# Patient Record
Sex: Female | Born: 1935
Health system: Southern US, Community
[De-identification: ages and names within clinical notes are randomized; demographics above are authoritative.]

## PROBLEM LIST (undated history)

## (undated) DIAGNOSIS — I341 Nonrheumatic mitral (valve) prolapse: Secondary | ICD-10-CM

## (undated) DIAGNOSIS — E039 Hypothyroidism, unspecified: Secondary | ICD-10-CM

## (undated) DIAGNOSIS — K529 Noninfective gastroenteritis and colitis, unspecified: Secondary | ICD-10-CM

## (undated) DIAGNOSIS — K219 Gastro-esophageal reflux disease without esophagitis: Secondary | ICD-10-CM

## (undated) DIAGNOSIS — K648 Other hemorrhoids: Secondary | ICD-10-CM

## (undated) DIAGNOSIS — D869 Sarcoidosis, unspecified: Secondary | ICD-10-CM

## (undated) DIAGNOSIS — Z4802 Encounter for removal of sutures: Secondary | ICD-10-CM

## (undated) DIAGNOSIS — W07XXXA Fall from chair, initial encounter: Secondary | ICD-10-CM

## (undated) DIAGNOSIS — R011 Cardiac murmur, unspecified: Secondary | ICD-10-CM

## (undated) DIAGNOSIS — A048 Other specified bacterial intestinal infections: Secondary | ICD-10-CM

## (undated) DIAGNOSIS — T7840XA Allergy, unspecified, initial encounter: Secondary | ICD-10-CM

## (undated) DIAGNOSIS — E785 Hyperlipidemia, unspecified: Secondary | ICD-10-CM

## (undated) DIAGNOSIS — G459 Transient cerebral ischemic attack, unspecified: Secondary | ICD-10-CM

## (undated) DIAGNOSIS — G4700 Insomnia, unspecified: Secondary | ICD-10-CM

## (undated) HISTORY — DX: Encounter for removal of sutures: Z48.02

## (undated) HISTORY — DX: Hypothyroidism, unspecified: E03.9

## (undated) HISTORY — DX: Allergy, unspecified, initial encounter: T78.40XA

## (undated) HISTORY — DX: Nonrheumatic mitral (valve) prolapse: I34.1

## (undated) HISTORY — DX: Insomnia, unspecified: G47.00

## (undated) HISTORY — DX: Gastro-esophageal reflux disease without esophagitis: K21.9

## (undated) HISTORY — DX: Hyperlipidemia, unspecified: E78.5

## (undated) HISTORY — DX: Other specified bacterial intestinal infections: A04.8

## (undated) HISTORY — DX: Other hemorrhoids: K64.8

## (undated) HISTORY — DX: Transient cerebral ischemic attack, unspecified: G45.9

## (undated) HISTORY — DX: Cardiac murmur, unspecified: R01.1

## (undated) HISTORY — DX: Noninfective gastroenteritis and colitis, unspecified: K52.9

## (undated) HISTORY — DX: Fall from chair, initial encounter: W07.XXXA

## (undated) HISTORY — DX: Sarcoidosis, unspecified: D86.9

## (undated) HISTORY — PX: TONSILLECTOMY: SUR1361

---

## 1964-09-08 DIAGNOSIS — D869 Sarcoidosis, unspecified: Secondary | ICD-10-CM

## 1964-09-08 HISTORY — PX: LYMPH NODE BIOPSY: SHX201

## 1964-09-08 HISTORY — DX: Sarcoidosis, unspecified: D86.9

## 1970-09-08 HISTORY — PX: VAGINAL HYSTERECTOMY: SUR661

## 1970-09-08 HISTORY — PX: ABDOMINAL HYSTERECTOMY: SHX81

## 1998-04-16 ENCOUNTER — Ambulatory Visit (HOSPITAL_COMMUNITY): Admission: RE | Admit: 1998-04-16 | Discharge: 1998-04-16 | Payer: Self-pay | Admitting: Gastroenterology

## 1998-10-08 ENCOUNTER — Encounter: Admission: RE | Admit: 1998-10-08 | Discharge: 1998-10-08 | Payer: Self-pay | Admitting: Internal Medicine

## 1999-01-21 ENCOUNTER — Encounter: Payer: Self-pay | Admitting: Emergency Medicine

## 1999-01-21 ENCOUNTER — Emergency Department (HOSPITAL_COMMUNITY): Admission: EM | Admit: 1999-01-21 | Discharge: 1999-01-21 | Payer: Self-pay | Admitting: Emergency Medicine

## 1999-09-17 ENCOUNTER — Encounter: Admission: RE | Admit: 1999-09-17 | Discharge: 1999-09-17 | Payer: Self-pay | Admitting: Internal Medicine

## 1999-10-10 ENCOUNTER — Ambulatory Visit (HOSPITAL_COMMUNITY): Admission: RE | Admit: 1999-10-10 | Discharge: 1999-10-10 | Payer: Self-pay | Admitting: Internal Medicine

## 1999-10-10 ENCOUNTER — Encounter: Payer: Self-pay | Admitting: Internal Medicine

## 1999-10-24 ENCOUNTER — Ambulatory Visit (HOSPITAL_COMMUNITY): Admission: RE | Admit: 1999-10-24 | Discharge: 1999-10-24 | Payer: Self-pay | Admitting: Internal Medicine

## 1999-10-24 ENCOUNTER — Encounter: Payer: Self-pay | Admitting: Internal Medicine

## 2000-03-30 ENCOUNTER — Encounter: Admission: RE | Admit: 2000-03-30 | Discharge: 2000-03-30 | Payer: Self-pay | Admitting: Internal Medicine

## 2001-05-27 ENCOUNTER — Encounter: Admission: RE | Admit: 2001-05-27 | Discharge: 2001-05-27 | Payer: Self-pay | Admitting: Family Medicine

## 2001-05-27 ENCOUNTER — Encounter: Payer: Self-pay | Admitting: Family Medicine

## 2001-09-01 ENCOUNTER — Encounter: Payer: Self-pay | Admitting: Emergency Medicine

## 2001-09-01 ENCOUNTER — Emergency Department (HOSPITAL_COMMUNITY): Admission: EM | Admit: 2001-09-01 | Discharge: 2001-09-01 | Payer: Self-pay | Admitting: Emergency Medicine

## 2001-09-20 ENCOUNTER — Encounter: Payer: Self-pay | Admitting: Family Medicine

## 2001-09-20 ENCOUNTER — Encounter: Admission: RE | Admit: 2001-09-20 | Discharge: 2001-09-20 | Payer: Self-pay | Admitting: Family Medicine

## 2001-11-17 ENCOUNTER — Encounter: Admission: RE | Admit: 2001-11-17 | Discharge: 2001-11-17 | Payer: Self-pay | Admitting: Orthopedic Surgery

## 2001-11-17 ENCOUNTER — Encounter: Payer: Self-pay | Admitting: Orthopedic Surgery

## 2003-01-19 ENCOUNTER — Encounter: Payer: Self-pay | Admitting: Family Medicine

## 2004-08-15 ENCOUNTER — Ambulatory Visit: Payer: Self-pay | Admitting: Internal Medicine

## 2004-08-20 ENCOUNTER — Ambulatory Visit: Payer: Self-pay | Admitting: Internal Medicine

## 2004-08-30 ENCOUNTER — Ambulatory Visit: Payer: Self-pay | Admitting: Internal Medicine

## 2005-04-04 ENCOUNTER — Ambulatory Visit: Payer: Self-pay | Admitting: Ophthalmology

## 2005-04-08 ENCOUNTER — Ambulatory Visit: Payer: Self-pay | Admitting: Ophthalmology

## 2005-04-08 DIAGNOSIS — G459 Transient cerebral ischemic attack, unspecified: Secondary | ICD-10-CM

## 2005-04-08 HISTORY — DX: Transient cerebral ischemic attack, unspecified: G45.9

## 2005-04-10 ENCOUNTER — Ambulatory Visit: Payer: Self-pay

## 2005-04-11 ENCOUNTER — Ambulatory Visit: Payer: Self-pay | Admitting: Cardiology

## 2005-04-24 ENCOUNTER — Encounter: Admission: RE | Admit: 2005-04-24 | Discharge: 2005-04-24 | Payer: Self-pay | Admitting: Cardiology

## 2005-05-07 ENCOUNTER — Ambulatory Visit: Payer: Self-pay | Admitting: Cardiology

## 2005-06-16 ENCOUNTER — Ambulatory Visit: Payer: Self-pay | Admitting: Cardiology

## 2005-07-15 ENCOUNTER — Ambulatory Visit: Payer: Self-pay | Admitting: Cardiology

## 2006-06-30 ENCOUNTER — Encounter: Admission: RE | Admit: 2006-06-30 | Discharge: 2006-06-30 | Payer: Self-pay | Admitting: Internal Medicine

## 2007-01-12 ENCOUNTER — Ambulatory Visit: Payer: Self-pay | Admitting: Cardiology

## 2007-07-08 ENCOUNTER — Encounter: Admission: RE | Admit: 2007-07-08 | Discharge: 2007-07-08 | Payer: Self-pay | Admitting: Internal Medicine

## 2008-07-10 ENCOUNTER — Encounter: Admission: RE | Admit: 2008-07-10 | Discharge: 2008-07-10 | Payer: Self-pay | Admitting: Internal Medicine

## 2008-07-13 ENCOUNTER — Encounter: Payer: Self-pay | Admitting: Family Medicine

## 2008-08-11 ENCOUNTER — Encounter: Payer: Self-pay | Admitting: Family Medicine

## 2008-12-18 ENCOUNTER — Encounter: Payer: Self-pay | Admitting: Family Medicine

## 2009-02-02 DIAGNOSIS — I2510 Atherosclerotic heart disease of native coronary artery without angina pectoris: Secondary | ICD-10-CM

## 2009-02-02 DIAGNOSIS — I2583 Coronary atherosclerosis due to lipid rich plaque: Secondary | ICD-10-CM

## 2009-02-02 DIAGNOSIS — I341 Nonrheumatic mitral (valve) prolapse: Secondary | ICD-10-CM | POA: Insufficient documentation

## 2009-02-13 ENCOUNTER — Ambulatory Visit: Payer: Self-pay | Admitting: Cardiology

## 2009-02-13 DIAGNOSIS — E782 Mixed hyperlipidemia: Secondary | ICD-10-CM

## 2009-07-15 ENCOUNTER — Emergency Department: Payer: Self-pay | Admitting: Emergency Medicine

## 2009-09-26 ENCOUNTER — Encounter: Payer: Self-pay | Admitting: Family Medicine

## 2009-09-26 ENCOUNTER — Encounter: Admission: RE | Admit: 2009-09-26 | Discharge: 2009-09-26 | Payer: Self-pay | Admitting: Internal Medicine

## 2009-11-16 ENCOUNTER — Encounter: Payer: Self-pay | Admitting: Family Medicine

## 2010-02-14 ENCOUNTER — Encounter: Payer: Self-pay | Admitting: Family Medicine

## 2010-03-18 ENCOUNTER — Ambulatory Visit: Payer: Self-pay | Admitting: Family Medicine

## 2010-03-18 DIAGNOSIS — E039 Hypothyroidism, unspecified: Secondary | ICD-10-CM

## 2010-03-18 DIAGNOSIS — K900 Celiac disease: Secondary | ICD-10-CM | POA: Insufficient documentation

## 2010-03-21 ENCOUNTER — Encounter: Payer: Self-pay | Admitting: Family Medicine

## 2010-03-29 ENCOUNTER — Encounter: Payer: Self-pay | Admitting: Family Medicine

## 2010-05-06 ENCOUNTER — Telehealth: Payer: Self-pay | Admitting: Family Medicine

## 2010-07-09 ENCOUNTER — Encounter: Payer: Self-pay | Admitting: Family Medicine

## 2010-09-08 DIAGNOSIS — A048 Other specified bacterial intestinal infections: Secondary | ICD-10-CM

## 2010-09-08 DIAGNOSIS — K529 Noninfective gastroenteritis and colitis, unspecified: Secondary | ICD-10-CM

## 2010-09-08 HISTORY — DX: Noninfective gastroenteritis and colitis, unspecified: K52.9

## 2010-09-08 HISTORY — DX: Other specified bacterial intestinal infections: A04.8

## 2010-10-08 NOTE — Miscellaneous (Signed)
Summary: prevention update  Clinical Lists Changes  Allergies: Added new allergy or adverse reaction of * ACTONEL AND FOSAMAX Observations: Added new observation of DEXANXTDUE: None (03/29/2010 10:52) Added new observation of PAST MED HX: MITRAL VALVE PROLAPSE (ICD-424.0) CORONARY ATHEROSCLEROSIS D/T LIPID RICH PLAQUE (ICD-414.3) TIA in 04/2005   Hypothyroidism  (03/29/2010 10:52) Added new observation of LASTBONESCAN: 07/13/2008 (07/13/2008 10:54) Added new observation of BONE DENSITY: abnormal (07/13/2008 10:54)      Past Medical History:    MITRAL VALVE PROLAPSE (ICD-424.0)    CORONARY ATHEROSCLEROSIS D/T LIPID RICH PLAQUE (ICD-414.3)    TIA in 04/2005         Hypothyroidism   Bone Density Result Date:  07/13/2008 Bone Density Result:  abnormal

## 2010-10-08 NOTE — Assessment & Plan Note (Signed)
Summary: NEW PT TO EST/CLE   Vital Signs:  Patient profile:   76 year old female Height:      63 inches Weight:      98 pounds BMI:     17.42 Temp:     98.5 degrees F oral Pulse rate:   68 / minute Pulse rhythm:   regular BP sitting:   120 / 70  (left arm) Cuff size:   regular  Vitals Entered By: Linde Gillis CMA Duncan Dull) (March 18, 2010 1:53 PM) CC: new patient, establish care  Vision Screening:Left eye with correction: 20 / 40 Right eye with correction: 20 / 25 Both eyes with correction: 20 / 30        Vision Entered By: Linde Gillis CMA Duncan Dull) (March 18, 2010 2:33 PM)  Hearing Screen  20db HL: Left  500 hz: 40db 1000 hz: 40db 2000 hz: 40db 4000 hz: 40db Right  500 hz: 25db 1000 hz: 25db 2000 hz: 25db 4000 hz: 25db   Hearing Testing Entered By: Linde Gillis CMA Duncan Dull) (March 18, 2010 2:33 PM)   History of Present Illness: New patient here for Medicare AWV:  1.   Risk factors based on Past M, S, F history: Hyperlipidemia- well controlled on Crestor 5 mg daily. Gluten intolerance- recently diagnosed with gluten intolerance at the [facility] (review scanned labs).  Adjusting her diet. Hypothyroidism- started on Armour 60 mg daily by [facility] last month.  Denies any signs or symptoms of hypo or hyperthyroidism. 2.   Physical Activities: very physically active, plays golf several times per week. 3.   Depression/mood: very happy, denies any symptoms of depression. 4.   Hearing: see nursing assessment- she is aware of some mild deficit. 5.   ADL's: see Geriatrics Assessment. 6.   Fall Risk: not a fall risk. 7.   Home Safety: very mobile, lives with husband.  home is safe. 8.   Height, weight, &visual acuity: see nursing assessment. 9.   Counseling:  Discussed gluten free diet, she will be seeing nutritionist Cheryll Dessert.  10.   Labs ordered based on risk factors: none, UTD on all labs.  Scanned into EMR.  11.           Referral Coordination UTD on all  prevention, does not need referral at this time. 12.           Care Plan- doing well.  Very active and healthy. Will plan to recheck lipids in one year. 13.            Cognitive Assessment- see Geriatrics assessment.   Current Medications (verified): 1)  Crestor 5 Mg Tabs (Rosuvastatin Calcium) .... Take 1 Tablet By Mouth At Bedtime 2)  Multivitamins   Tabs (Multiple Vitamin) .Marland Kitchen.. 1 Tab Once Daily 3)  Vitamin D 1000 Unit Caps (Cholecalciferol) .Marland Kitchen.. 1 Cap Once Daily 4)  Aspirin 81 Mg Tbec (Aspirin) .... Take One Tablet By Mouth Daily 5)  Calcium 500 Mg Tabs (Calcium Carbonate) .... 2 Tabs Two Times A Day 6)  Armour Thyroid 30 Mg Tabs (Thyroid) .... Take One Tablet By Mouth Two Times A Day 7)  Intrinsi B12-Folate 800-500-20 Mcg-Mcg-Mg Tabs (Folate-B12-Intrinsic Factor) .... Take One Tablet By Mouth Once Weekly  Allergies (verified): 1)  ! Pcn  Past History:  Past Surgical History: Last updated: 02/02/2009 Hysterectomy..1972  Family History: Last updated: 02/02/2009 Family History of Cancer: Mother deceased at 40 Family History of Alcoholism: 1 Brother deceased at 65, other brother still alive  Social History: Last updated: 03/18/2010 Retired .Marland Kitchen42 Married  Tobacco Use - No.  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 Eyes:  Denies blurring. ENT:  Denies difficulty swallowing. CV:  Denies chest pain or discomfort. Resp:  Denies shortness of breath. GI:  Denies abdominal pain, bloody stools, and change in bowel  habits. GU:  Denies discharge and dysuria. MS:  Denies joint pain, joint redness, and joint swelling. Derm:  Denies rash. Neuro:  Denies headaches. Psych:  Denies anxiety and depression. Endo:  Denies cold intolerance and heat intolerance. Heme:  Denies abnormal bruising and bleeding.  Physical Exam  General:  Well developed, well nourished, in no acute distress. Head:  normocephalic and atraumatic Eyes:  PERRLA/EOM intact; conjunctiva and lids normal. Ears:  R ear normal and L ear normal.   Nose:  no external deformity.   Mouth:  good dentition.   Neck:  Neck supple, no JVD. No masses, thyromegaly or abnormal cervical nodes. Chest Wall:  no deformities or breast masses noted Lungs:  Clear bilaterally to auscultation and percussion. Heart:  Normal rate and regular rhythm. S1 and S2 normal without gallop, murmur, click, rub or other extra sounds. Abdomen:  Bowel sounds positive; abdomen soft and non-tender without masses, organomegaly, or hernias noted. No hepatosplenomegaly. Extremities:  no edema Neurologic:  alert & oriented X3 and cranial nerves II-XII intact.   Skin:  Intact without lesions or rashes. Psych:  Cognition and judgment appear intact. Alert and cooperative with normal attention span and concentration. No apparent delusions, illusions, hallucinations   Impression & Recommendations:  Problem # 1:  Preventive Health Care (ICD-V70.0)  Reviewed preventive care protocols, scheduled due services, and updated immunizations Discussed nutrition, exercise, diet, and healthy lifestyle.  UTD on all prevention measures.  Orders: Subsequent annual wellness visit with prevention plan ([id number])  Complete Medication List: 1)  Crestor 5 Mg Tabs (Rosuvastatin calcium) .... Take 1 tablet by mouth at bedtime 2)  Multivitamins Tabs (Multiple vitamin) .Marland Kitchen.. 1 tab once daily 3)  Vitamin D 1000 Unit Caps (Cholecalciferol) .Marland Kitchen.. 1 cap once daily 4)  Aspirin 81 Mg Tbec (Aspirin) .... Take  one tablet by mouth daily 5)  Calcium 500 Mg Tabs (Calcium carbonate) .... 2 tabs two times a day 6)  Armour Thyroid 30 Mg Tabs (Thyroid) .... Take one tablet by mouth two times a day 7)  Intrinsi B12-folate 800-500-20 Mcg-mcg-mg Tabs (Folate-b12-intrinsic factor) .... Take one tablet by mouth once weekly  Current Allergies (reviewed today): ! PCN  TD Result Date:  03/29/2009 TD Result:  historical Pneumovax Result Date:  03/21/2009 Pneumovax Result:  historical Herpes Zoster Result Date:  03/16/2008 Herpes Zoster Result:  historical HDL Result Date:  02/14/2010 HDL Result:  93 HDL Next Due:  1 yr LDL Result Date:  02/14/2010 LDL Result:  57 LDL Next Due:  1 yr Flex Sig Next Due:  Not Indicated Colonoscopy Result Date:  09/20/2009 Colonoscopy Result:  normal Colonoscopy Next Due:  10 yr Hemoccult Next Due:  Not Indicated PAP Next Due:  Not  Indicated Mammogram Result Date:  09/11/2009 Mammogram Result:  normal Mammogram Next Due:  1 yr  Prevention & Chronic Care Immunizations   Influenza vaccine: Not documented    Tetanus booster: 03/29/2009: historical   Tetanus booster due: 03/30/2019    Pneumococcal vaccine: historical  (03/21/2009)   Pneumococcal vaccine due: None    H. zoster vaccine: 03/16/2008: historical  Colorectal Screening   Hemoccult: Not documented   Hemoccult due: Not Indicated    Colonoscopy: normal  (09/20/2009)   Colonoscopy due: 09/21/2019  Other Screening   Pap smear: Not documented   Pap smear due: Not Indicated    Mammogram: normal  (09/11/2009)   Mammogram due: 09/11/2010    DXA bone density scan: Not documented   Smoking status: never  (02/02/2009)  Lipids   Total Cholesterol: Not documented   Lipid panel action/deferral: Not indicated   LDL: 57  (02/14/2010)   LDL Direct: Not documented   HDL: 93  (02/14/2010)   Triglycerides: Not documented    SGOT (AST): Not documented   BMP action: Not indicated   SGPT (ALT): Not  documented   Alkaline phosphatase: Not documented   Total bilirubin: Not documented    Lipid flowsheet reviewed?: Yes   Progress toward LDL goal: At goal  Self-Management Support :    Lipid self-management support: Not documented      Geriatric Assessment:  Activities of Daily Living:    Bathing-independent    Dressing-independent    Eating-independent    Toileting-independent    Transferring-independent    Continence-independent  Instrumental Activities of Daily Living:    Transportation-independent    Meal/Food Preparation-independent    Shopping Errands-independent    Housekeeping/Chores-independent    Money Management/Finances-independent    Medication Management-independent    Ability to Use Telephone-independent    Laundry-independent  Mental Status Exam: (value/max value)    Orientation to Time: 5/5    Orientation to Place: 5/5    Registration: 3/3    Attention/Calculation: 5/5    Recall: 3/3    Language-name 2 objects: 2/2    Language-repeat: 1/1    Language-follow 3-step command: 3/3    Language-read and follow direction: 1/1    Write a sentence: 1/1    Copy design: 1/1 MSE Total score: 30/30

## 2010-10-08 NOTE — Letter (Signed)
Summary: [facility]  [facility]   Imported By: Lanelle Bal 03/27/2010 13:35:00  _____________________________________________________________________  External Attachment:    Type:   Image     Comment:   External Document

## 2010-10-08 NOTE — Miscellaneous (Signed)
Summary: Declaration Of A Desire For A Natural Death  Declaration Of A Desire For A Natural Death   Imported By: Beau Fanny 03/29/2010 14:45:56  _____________________________________________________________________  External Attachment:    Type:   Image     Comment:   External Document

## 2010-10-08 NOTE — Progress Notes (Signed)
Summary: wants phone call  Phone Note Call from Patient Call back at Home Phone [phone]   Caller: Patient Call For: Dr. Dayton Martes  Summary of Call: Patient saw her dermatologist on saturday, and found out that she has shingles. She is leaving tomorrow morning to go to [place]. She is asking if you could give her a call. She would like to ask you a couple of questions about the shingles before leaving.  Initial call taken by: Melody Comas,  May 06, 2010 9:05 AM  Follow-up for Phone Call        Returned pt's phone call.  Has shingles around left arm pit and on back.  On Valtrex and Alleve.  Wants to know what to expect. Discussed course of Shingles.  Pt happy with explanation. Ruthe Mannan MD  May 06, 2010 9:54 AM

## 2010-10-08 NOTE — Letter (Signed)
Summary: [facility]  [facility]   Imported By: Lennie Odor 04/02/2010 11:13:52  _____________________________________________________________________  External Attachment:    Type:   Image     Comment:   External Document

## 2010-10-08 NOTE — Procedures (Signed)
Summary: Colonoscopy / [facility]  Colonoscopy / [facility]   Imported By: Lennie Odor 04/02/2010 10:58:20  _____________________________________________________________________  External Attachment:    Type:   Image     Comment:   External Document

## 2010-10-08 NOTE — Miscellaneous (Signed)
Summary: Allergy Testing/[facility]  Allergy Testing/[facility]   Imported By: Lanelle Bal 03/21/2010 11:35:23  _____________________________________________________________________  External Attachment:    Type:   Image     Comment:   External Document

## 2010-10-08 NOTE — Miscellaneous (Signed)
Summary: Health Care Power Of [facility] Power Of Attorney   Imported By: Beau Fanny 03/29/2010 14:46:48  _____________________________________________________________________  External Attachment:    Type:   Image     Comment:   External Document

## 2010-10-08 NOTE — Letter (Signed)
Summary: [facility]  [facility]   Imported By: Lanelle Bal 07/22/2010 11:36:22  _____________________________________________________________________  External Attachment:    Type:   Image     Comment:   External Document

## 2011-01-24 NOTE — Assessment & Plan Note (Signed)
[facility]                            CARDIOLOGY OFFICE NOTE   EUN, VERMEER                       MRN:          [id number]  DATE:01/12/2007                            DOB:          [birth year]    Ms. Gaal returns today for further management of her mitral valve  prolapse and mild atherosclerotic plaque of her carotid arteries.   She is totally asymptomatic. She looks remarkably healthy.   She has complied with her Crestor 5 mg a day and her aspirin 325 a day.  Her blood work is being followed by Dr.  Kirby Funk of [place].   Her blood pressure today is 122/82, pulse 65 and regular. Weight is 103.  HEENT: Normocephalic, atraumatic. PERRLA. Extra-ocular movements intact.  Sclerae are clear. Facial symmetry is normal.  Carotid upstrokes are  equal bilaterally without bruits. There is no thyromegaly.  LUNGS:  Are clear.  HEART: Reveals a nondisplaced PMI. She has no obvious clicks. There is  minimal murmur at the apex.  ABDOMEN: Soft.  EXTREMITIES: Reveals no cyanosis, clubbing or edema. Pulses are brisk.   EKG: Is normal.   ASSESSMENT/PLAN:  Ms. Didio is doing well. I have asked her to continue  with her Crestor and her aspirin.   In addition, I have told her she does not need SBE prophylaxis.   I will plan on seeing her back in two years unless there are problems in  the interim.     Thomas C. Daleen Squibb, MD, [facility]  Electronically Signed    TCW/MedQ  DD: 01/12/2007  DT: 01/12/2007  Job #: [id number]

## 2011-04-15 ENCOUNTER — Other Ambulatory Visit: Payer: Self-pay | Admitting: Family Medicine

## 2011-04-15 DIAGNOSIS — Z1231 Encounter for screening mammogram for malignant neoplasm of breast: Secondary | ICD-10-CM

## 2011-05-01 ENCOUNTER — Encounter: Payer: Self-pay | Admitting: Cardiology

## 2011-05-06 ENCOUNTER — Ambulatory Visit: Payer: Self-pay

## 2011-05-10 ENCOUNTER — Emergency Department (HOSPITAL_COMMUNITY): Payer: Medicare Other

## 2011-05-10 ENCOUNTER — Emergency Department (HOSPITAL_COMMUNITY)
Admission: EM | Admit: 2011-05-10 | Discharge: 2011-05-10 | Disposition: A | Discharge status: Home / Self Care | Payer: Medicare Other | Attending: Emergency Medicine | Admitting: Emergency Medicine

## 2011-05-10 DIAGNOSIS — R197 Diarrhea, unspecified: Secondary | ICD-10-CM | POA: Insufficient documentation

## 2011-05-10 DIAGNOSIS — K5289 Other specified noninfective gastroenteritis and colitis: Secondary | ICD-10-CM | POA: Insufficient documentation

## 2011-05-10 DIAGNOSIS — E7800 Pure hypercholesterolemia, unspecified: Secondary | ICD-10-CM | POA: Insufficient documentation

## 2011-05-10 DIAGNOSIS — R109 Unspecified abdominal pain: Secondary | ICD-10-CM | POA: Insufficient documentation

## 2011-05-10 LAB — SAMPLE TO BLOOD BANK

## 2011-05-10 LAB — DIFFERENTIAL
Eosinophils Absolute: 0.1 10*3/uL (ref 0.0–0.7)
Lymphocytes Relative: 27 % (ref 12–46)
Lymphs Abs: 1.9 10*3/uL (ref 0.7–4.0)
Monocytes Relative: 6 % (ref 3–12)
Neutrophils Relative %: 65 % (ref 43–77)

## 2011-05-10 LAB — CBC
HCT: 41 % (ref 36.0–46.0)
MCH: 32.1 pg (ref 26.0–34.0)
MCV: 94 fL (ref 78.0–100.0)
Platelets: 245 10*3/uL (ref 150–400)
RBC: 4.36 MIL/uL (ref 3.87–5.11)
WBC: 6.8 10*3/uL (ref 4.0–10.5)

## 2011-05-10 LAB — COMPREHENSIVE METABOLIC PANEL
AST: 21 U/L (ref 0–37)
BUN: 10 mg/dL (ref 6–23)
CO2: 27 mEq/L (ref 19–32)
Calcium: 9.2 mg/dL (ref 8.4–10.5)
Chloride: 100 mEq/L (ref 96–112)
Creatinine, Ser: 0.75 mg/dL (ref 0.50–1.10)
GFR calc Af Amer: >60 mL/min (ref >60)
GFR calc non Af Amer: >60 mL/min (ref >60)
Glucose, Bld: 96 mg/dL (ref 70–99)
Total Bilirubin: 1 mg/dL (ref 0.3–1.2)

## 2011-05-10 LAB — URINALYSIS, ROUTINE W REFLEX MICROSCOPIC
Bilirubin Urine: NEGATIVE
Ketones, ur: NEGATIVE mg/dL
Leukocytes, UA: NEGATIVE
Nitrite: NEGATIVE
Protein, ur: NEGATIVE mg/dL
pH: 6 (ref 5.0–8.0)

## 2011-05-10 MED ORDER — IOHEXOL 300 MG/ML  SOLN
100.0000 mL | Freq: Once | INTRAMUSCULAR | Status: AC | PRN
  Administered 2011-05-10: 100 mL via INTRAVENOUS

## 2011-05-20 ENCOUNTER — Ambulatory Visit (INDEPENDENT_AMBULATORY_CARE_PROVIDER_SITE_OTHER): Payer: Medicare Other | Admitting: Family Medicine

## 2011-05-20 ENCOUNTER — Ambulatory Visit: Payer: Self-pay | Admitting: Cardiology

## 2011-05-20 ENCOUNTER — Encounter: Payer: Self-pay | Admitting: Family Medicine

## 2011-05-20 VITALS — BP 132/82 | HR 64 | Temp 98.7°F | Wt 98.5 lb

## 2011-05-20 DIAGNOSIS — K5289 Other specified noninfective gastroenteritis and colitis: Secondary | ICD-10-CM

## 2011-05-20 DIAGNOSIS — K529 Noninfective gastroenteritis and colitis, unspecified: Secondary | ICD-10-CM | POA: Insufficient documentation

## 2011-05-20 NOTE — Progress Notes (Signed)
Subjective:    Patient ID: Deanna Cobb, female    DOB: 07/12/1935, 76 y.o.   MRN: [id number]  HPI  76 yo here for ER follow up. Notes reviewed. Went to [facility] on 05/10/2011 after experiencing diarrhea for over week with associated abdominal cramping and two episodes of black stool. No bright red blood in stool. No fever. FOB neg in ER. CBC, CMET within normal limits.  Trace microscopic hematuria.  CT of abd/pelvis consistent with sigmoid colon segmental colitis.  Given rx for cipro 1 tab twice daily x 7 days and Flagyl 1 tab twice daily x 7 days and as needed Percocet. Did not take cipro, caused stomach upset.  Symptoms have almost resolved, still having a few more BMs than she normally does per day but no cramping and less loose.  Has a life long h/o ?IBS.   Symptoms seem to improve when she cuts bread from diet.  Patient Active Problem List  Diagnoses  . HYPOTHYROIDISM  . HYPERLIPIDEMIA TYPE IIB / III  . CORONARY ATHEROSCLEROSIS D/T LIPID RICH PLAQUE  . MITRAL VALVE PROLAPSE  . GLUTEN ENTEROPATHY   Past Medical History  Diagnosis Date  . Mitral valve prolapse   . Coronary atherosclerosis     D/T lipid rich plaque  . TIA (transient ischemic attack) 04/2005  . Hypothyroidism   . History of colonoscopy    Past Surgical History  Procedure Date  . Abdominal hysterectomy 1972   History  Substance Use Topics  . Smoking status: Never Smoker   . Smokeless tobacco: Not on file  . Alcohol Use: No   Family History  Problem Relation Age of Onset  . Cancer Mother   . Alcohol abuse Brother    Allergies  Allergen Reactions  . Penicillins     REACTION: hives   Current Outpatient Prescriptions on File Prior to Visit  Medication Sig Dispense Refill  . aspirin 81 MG tablet Take 81 mg by mouth daily.        . calcium gluconate 500 MG tablet Take 1,000 mg by mouth 2 (two) times daily.        . cholecalciferol (VITAMIN D) 1000 UNITS tablet Take 1,000 Units by mouth daily.         Bennetta Laos Factor (INTRINSI B12-FOLATE) 800-500-20 MCG-MCG-MG TABS Take 1 tablet by mouth once a week.        . Multiple Vitamin (MULTIVITAMIN) capsule Take 1 capsule by mouth daily.        . rosuvastatin (CRESTOR) 5 MG tablet Take 5 mg by mouth at bedtime.        Marland Kitchen thyroid (ARMOUR) 30 MG tablet Take 30 mg by mouth 2 (two) times daily.            Review of Systems See HPI    Objective:   Physical Exam There were no vitals taken for this visit.  General:  Well-developed,well-nourished,in no acute distress; alert,appropriate and cooperative throughout examination Head:  normocephalic and atraumatic.   Eyes:  vision grossly intact, pupils equal, pupils round, and pupils reactive to light.   Ears:  R ear normal and L ear normal.   Nose:  no external deformity.   Mouth:  good dentition.   Neck:  No deformities, masses, or tenderness noted. Lungs:  Normal respiratory effort, chest expands symmetrically. Lungs are clear to auscultation, no crackles or wheezes. Heart:  Normal rate and regular rhythm. S1 and S2 normal without gallop, murmur, click, rub or other extra sounds.  Abdomen:  Bowel sounds positive,abdomen soft and non-tender without masses, organomegaly or hernias noted. Msk:  No deformity or scoliosis noted of thoracic or lumbar spine.   Extremities:  No clubbing, cyanosis, edema, or deformity noted with normal full range of motion of all joints.   Neurologic:  alert & oriented X3 and gait normal.   Skin:  Intact without suspicious lesions or rashes Psych:  Cognition and judgment appear intact. Alert and cooperative with normal attention span and concentration. No apparent delusions, illusions, hallucinations        Assessment & Plan:   1. Colitis   New. Refer to GI for further work up- colonoscopy and ?endoscopy given possible h/o glutten intolerance and black stools. Pt and husband in agreement with plan. Black stools have resolved, CBC stable.  Emergent  referral not necessary at this time.

## 2011-05-21 ENCOUNTER — Ambulatory Visit
Admission: RE | Admit: 2011-05-21 | Discharge: 2011-05-21 | Disposition: A | Discharge status: Home / Self Care | Payer: No Typology Code available for payment source | Source: Ambulatory Visit | Attending: Family Medicine | Admitting: Family Medicine

## 2011-05-21 DIAGNOSIS — Z1231 Encounter for screening mammogram for malignant neoplasm of breast: Secondary | ICD-10-CM

## 2011-05-22 ENCOUNTER — Encounter: Payer: Self-pay | Admitting: *Deleted

## 2011-05-29 ENCOUNTER — Ambulatory Visit: Payer: Self-pay | Admitting: Cardiology

## 2011-06-02 ENCOUNTER — Other Ambulatory Visit: Payer: Self-pay | Admitting: Family Medicine

## 2011-06-02 ENCOUNTER — Ambulatory Visit (INDEPENDENT_AMBULATORY_CARE_PROVIDER_SITE_OTHER): Payer: Medicare Other | Admitting: Cardiology

## 2011-06-02 ENCOUNTER — Encounter: Payer: Self-pay | Admitting: Cardiology

## 2011-06-02 VITALS — BP 122/62 | HR 85 | Ht 63.0 in | Wt 99.4 lb

## 2011-06-02 DIAGNOSIS — E782 Mixed hyperlipidemia: Secondary | ICD-10-CM

## 2011-06-02 DIAGNOSIS — Z0000 Encounter for general adult medical examination without abnormal findings: Secondary | ICD-10-CM

## 2011-06-02 DIAGNOSIS — Z8673 Personal history of transient ischemic attack (TIA), and cerebral infarction without residual deficits: Secondary | ICD-10-CM

## 2011-06-02 DIAGNOSIS — I059 Rheumatic mitral valve disease, unspecified: Secondary | ICD-10-CM

## 2011-06-02 DIAGNOSIS — E039 Hypothyroidism, unspecified: Secondary | ICD-10-CM

## 2011-06-02 NOTE — Progress Notes (Signed)
HPI Deanna Cobb returns for the evaluation and management of her history of a TIA , mitral valve prolapse, and hyperlipidemia. She continues to take low-dose aspirin and low-dose Crestor.  She's having no symptoms of vascular disease. This includes angina, TIAs, or claudication. E.aboratory data being followed by her primary care.  EKG today is normal. Past Medical History  Diagnosis Date  . Mitral valve prolapse   . Coronary atherosclerosis     D/T lipid rich plaque  . TIA (transient ischemic attack) 04/2005  . Hypothyroidism   . History of colonoscopy     Past Surgical History  Procedure Date  . Abdominal hysterectomy 1972    Family History  Problem Relation Age of Onset  . Cancer Mother   . Alcohol abuse Brother     History   Social History  . Marital Status: Married    Spouse Name: N/A    Number of Children: N/A  . Years of Education: N/A   Occupational History  . 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 Smoking status: Never Smoker   . Smokeless tobacco: Not on file  . Alcohol Use: No  . Drug Use: No  . Sexually Active: Not on file   Other Topics Concern  . Not on file   Social History Narrative  . No narrative on file    Allergies  Allergen Reactions  . Penicillins     REACTION: hives    Current Outpatient Prescriptions  Medication Sig Dispense Refill  . aspirin 81 MG tablet Take 81 mg by mouth daily.        . calcium gluconate 500 MG tablet Take 1,000 mg by mouth 2 (two) times daily.        . cholecalciferol (VITAMIN D) 1000 UNITS tablet Take 1,000 Units by mouth daily.        Marland Kitchen EST ESTROGENS-METHYLTEST DS PO Take 1 capsule by mouth daily.        Bennetta Laos Factor (INTRINSI B12-FOLATE) 800-500-20 MCG-MCG-MG TABS Take 1 tablet by mouth once a week.        . progesterone (PROMETRIUM) 100 MG capsule Take 100 mg by mouth daily.        . rosuvastatin (CRESTOR) 5 MG tablet Take 5 mg by mouth at bedtime.        Marland Kitchen thyroid (ARMOUR) 30  MG tablet Take 30 mg by mouth 2 (two) times daily.          ROS Negative other than HPI.   PE General Appearance: well developed, well nourished in no acute distress HEENT: symmetrical face, PERRLA, good dentition  Neck: no JVD, thyromegaly, or adenopathy, trachea midline Chest: symmetric without deformity Cardiac: PMI non-displaced, RRR, normal S1, S2, no gallop or murmur Lung: clear to ausculation and percussion Vascular: all pulses full without bruits  Abdominal: nondistended, nontender, good bowel sounds, no HSM, no bruits Extremities: no cyanosis, clubbing or edema, no sign of DVT, no varicosities  Skin: normal color, no rashes Neuro: alert and oriented x 3, non-focal Pysch: normal affect Filed Vitals:   06/02/11 1341  BP: 122/62  Pulse: 85  Height: 5\' 3"  (1.6 m)  Weight: 99 lb 6.4 oz (45.088 kg)    EKG  Labs and Studies Reviewed.   Lab Results  Component Value Date   WBC 6.8 05/10/2011   HGB 14.0 05/10/2011   HCT 41.0 05/10/2011   MCV 94.0 05/10/2011   PLT 245 05/10/2011      Chemistry  Component Value Date/Time   NA 137 05/10/2011 1105   K 3.6 05/10/2011 1105   CL 100 05/10/2011 1105   CO2 27 05/10/2011 1105   BUN 10 05/10/2011 1105   CREATININE 0.75 05/10/2011 1105      Component Value Date/Time   CALCIUM 9.2 05/10/2011 1105   ALKPHOS 53 05/10/2011 1105   AST 21 05/10/2011 1105   ALT 15 05/10/2011 1105   BILITOT 1.0 05/10/2011 1105       No results found for this basename: CHOL   Lab Results  Component Value Date   HDL 93 02/14/2010   Lab Results  Component Value Date   LDLCALC 57 02/14/2010   No results found for this basename: TRIG   No results found for this basename: CHOLHDL   No results found for this basename: HGBA1C   Lab Results  Component Value Date   ALT 15 05/10/2011   AST 21 05/10/2011   ALKPHOS 53 05/10/2011   BILITOT 1.0 05/10/2011   No results found for this basename: TSH

## 2011-06-02 NOTE — Patient Instructions (Signed)
Your physician recommends that you schedule a follow-up appointment in: 1 year as needed with Dr. Daleen Squibb

## 2011-06-02 NOTE — Assessment & Plan Note (Signed)
Asymptomatic. No significant murmur. Echocardiography not indicated

## 2011-06-02 NOTE — Assessment & Plan Note (Signed)
No recurrence. Continue low-dose statin low-dose aspirin.

## 2011-06-03 ENCOUNTER — Encounter: Payer: Self-pay | Admitting: Internal Medicine

## 2011-06-04 ENCOUNTER — Other Ambulatory Visit (INDEPENDENT_AMBULATORY_CARE_PROVIDER_SITE_OTHER): Payer: Medicare Other

## 2011-06-04 DIAGNOSIS — E039 Hypothyroidism, unspecified: Secondary | ICD-10-CM

## 2011-06-04 DIAGNOSIS — E782 Mixed hyperlipidemia: Secondary | ICD-10-CM

## 2011-06-04 DIAGNOSIS — Z0000 Encounter for general adult medical examination without abnormal findings: Secondary | ICD-10-CM

## 2011-06-04 LAB — BASIC METABOLIC PANEL
BUN: 17 mg/dL (ref 6–23)
CO2: 31 mEq/L (ref 19–32)
Calcium: 9.5 mg/dL (ref 8.4–10.5)
GFR: 99.83 mL/min (ref >60.00)
Glucose, Bld: 88 mg/dL (ref 70–99)
Potassium: 4.5 mEq/L (ref 3.5–5.1)

## 2011-06-04 LAB — LIPID PANEL
HDL: 60.2 mg/dL (ref >39.00)
Total CHOL/HDL Ratio: 2
VLDL: 8.4 mg/dL (ref 0.0–40.0)

## 2011-06-04 LAB — HEPATIC FUNCTION PANEL
AST: 20 U/L (ref 0–37)
Albumin: 3.9 g/dL (ref 3.5–5.2)
Total Bilirubin: 1.4 mg/dL — ABNORMAL HIGH (ref 0.3–1.2)

## 2011-06-04 LAB — TSH: TSH: 0.02 u[IU]/mL — ABNORMAL LOW (ref 0.35–5.50)

## 2011-06-05 ENCOUNTER — Other Ambulatory Visit: Payer: Self-pay | Admitting: *Deleted

## 2011-06-05 MED ORDER — THYROID 15 MG PO TABS: 15.0000 mg | ORAL_TABLET | Freq: Every day | ORAL | Status: DC

## 2011-06-11 ENCOUNTER — Ambulatory Visit (INDEPENDENT_AMBULATORY_CARE_PROVIDER_SITE_OTHER): Payer: Medicare Other | Admitting: Family Medicine

## 2011-06-11 ENCOUNTER — Encounter: Payer: Self-pay | Admitting: Family Medicine

## 2011-06-11 VITALS — BP 130/80 | HR 78 | Temp 98.3°F | Ht 63.0 in | Wt 96.8 lb

## 2011-06-11 DIAGNOSIS — K5289 Other specified noninfective gastroenteritis and colitis: Secondary | ICD-10-CM

## 2011-06-11 DIAGNOSIS — Z780 Asymptomatic menopausal state: Secondary | ICD-10-CM

## 2011-06-11 DIAGNOSIS — I059 Rheumatic mitral valve disease, unspecified: Secondary | ICD-10-CM

## 2011-06-11 DIAGNOSIS — Z0000 Encounter for general adult medical examination without abnormal findings: Secondary | ICD-10-CM

## 2011-06-11 DIAGNOSIS — E782 Mixed hyperlipidemia: Secondary | ICD-10-CM

## 2011-06-11 DIAGNOSIS — H6120 Impacted cerumen, unspecified ear: Secondary | ICD-10-CM

## 2011-06-11 DIAGNOSIS — Z23 Encounter for immunization: Secondary | ICD-10-CM

## 2011-06-11 DIAGNOSIS — K900 Celiac disease: Secondary | ICD-10-CM

## 2011-06-11 DIAGNOSIS — E039 Hypothyroidism, unspecified: Secondary | ICD-10-CM

## 2011-06-11 DIAGNOSIS — K529 Noninfective gastroenteritis and colitis, unspecified: Secondary | ICD-10-CM

## 2011-06-11 LAB — EAR CERUMEN REMOVAL

## 2011-06-11 NOTE — Progress Notes (Signed)
HPI Deanna Cobb is here today for medicare annual wellness visit.  Saw Dr. Daleen Squibb last week for management of her history of a TIA , mitral valve prolapse, and hyperlipidemia. Taking low-dose aspirin and low-dose Crestor. Lab Results  Component Value Date   CHOL 146 06/04/2011   Lab Results  Component Value Date   HDL 60.20 06/04/2011   HDL 93 02/14/2010   Lab Results  Component Value Date   LDLCALC 77 06/04/2011   LDLCALC 57 02/14/2010   Lab Results  Component Value Date   TRIG 42.0 06/04/2011   Lab Results  Component Value Date   CHOLHDL 2 06/04/2011    Lab Results  Component Value Date   ALT 19 06/04/2011   AST 20 06/04/2011   ALKPHOS 50 06/04/2011   BILITOT 1.4* 06/04/2011      EKG was normal.  Hypothyroidism- TSH low, advised to decrease Armour dosage to 15 mg daily.  Follow up labs in 8 weeks.    Lab Results  Component Value Date   TSH 0.02* 06/04/2011    Colitis- symptoms have resolved.  Has appt scheduled with Dr. Juanda Chance early next month.  Difficulty hearing out of left ear with some vertigo symptoms with quick changes in head position.    I have personally reviewed the Medicare Annual Wellness questionnaire and have noted 1. The patient's medical and social history 2. Their use of alcohol, tobacco or illicit drugs 3. Their current medications and supplements 4. The patient's functional ability including ADL's, fall risks, home safety risks and hearing or visual             impairment. 5. Diet and physical activities 6. Evidence for depression or mood disorders  Past Medical History  Diagnosis Date  . Mitral valve prolapse   . Coronary atherosclerosis     D/T lipid rich plaque  . TIA (transient ischemic attack) 04/2005  . Hypothyroidism   . History of colonoscopy     Past Surgical History  Procedure Date  . Abdominal hysterectomy 1972    Family History  Problem Relation Age of Onset  . Cancer Mother   . Alcohol abuse Brother     History   Social  History  . Marital Status: Married    Spouse Name: N/A    Number of Children: N/A  . Years of Education: N/A   Occupational History  . 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 Smoking status: Never Smoker   . Smokeless tobacco: Not on file  . Alcohol Use: No  . Drug Use: No  . Sexually Active: Not on file   Other Topics Concern  . Not on file   Social History Narrative  . No narrative on file    Allergies  Allergen Reactions  . Penicillins     REACTION: hives    Current Outpatient Prescriptions  Medication Sig Dispense Refill  . aspirin 81 MG tablet Take 81 mg by mouth daily.        . calcium gluconate 500 MG tablet Take 1,000 mg by mouth 2 (two) times daily.        . cholecalciferol (VITAMIN D) 1000 UNITS tablet Take 1,000 Units by mouth daily.        Marland Kitchen EST ESTROGENS-METHYLTEST DS PO Take 1 capsule by mouth daily.        Bennetta Laos Factor (INTRINSI B12-FOLATE) 800-500-20 MCG-MCG-MG TABS Take 1 tablet by mouth once a week.        Marland Kitchen  progesterone (PROMETRIUM) 100 MG capsule Take 100 mg by mouth daily.        . rosuvastatin (CRESTOR) 5 MG tablet Take 5 mg by mouth at bedtime.        Marland Kitchen thyroid (ARMOUR) 15 MG tablet Take 1 tablet (15 mg total) by mouth daily.  30 tablet  3    ROS Negative other than HPI.   PE BP 130/80  Pulse 78  Temp(Src) 98.3 F (36.8 C) (Oral)  Ht 5\' 3"  (1.6 m)  Wt 96 lb 12 oz (43.886 kg)  BMI 17.14 kg/m2  General Appearance: well developed, well nourished in no acute distress HEENT: symmetrical face, PERRLA, good dentition, +cerumen impaction left ear, right TM normal  Neck: no JVD, thyromegaly, or adenopathy, trachea midline Chest: symmetric without deformity Cardiac: PMI non-displaced, RRR, normal S1, S2, no gallop or murmur Lung: clear to ausculation and percussion Vascular: all pulses full without bruits  Abdominal: nondistended, nontender, good bowel sounds, no HSM, no bruits Extremities: no cyanosis, clubbing or  edema, no sign of DVT, no varicosities  Skin: normal color, no rashes Neuro: alert and oriented x 3, non-focal Pysch: normal affect  Assessment and Plan: 1. Routine general medical examination at a health care facility   The patients weight, height, BMI and visual acuity have been recorded in the chart I have made referrals, counseling and provided education to the patient based review of the above and I have provided the pt with a written personalized care plan for preventive services.    2. Colitis   Resolved.     3. GLUTEN ENTEROPATHY   Improved since cutting back on Gluten.     4. HYPERLIPIDEMIA TYPE IIB / III   Very well controlled.   5. HYPOTHYROIDISM   Follow up labs in 8 weeks.     6. Post-menopausal   Could not tolerate fosamax in past.  Recheck bone density.  Increased dietary calcium intake.   See pt instructions for details.  DG Bone Density  7. Cerumen impaction   Ceruminosis is noted.  Wax is removed by syringing and manual debridement. Instructions for home care to prevent wax buildup are given.

## 2011-06-11 NOTE — Patient Instructions (Addendum)
Good to see you. Let's recheck your cholesterol in 3-6 months.   Calcium supplements have received some bad press lately, with questions that they may increase risk of heart attack or blood clots.  The risk is very low, however none of these risks occur with calcium in FOOD. Try to get most or all of your calcium from your food--aim for 1000 mg/day for women up to 50 and men up to 70 and 1200 mg/day for women over 50 and men over 70.  To figure out dietary calcium: 300 mg/day from all non dairy foods plus 300 mg per cup of milk, other dairy, or fortified juice.  Non dairy foods that contain calcium:  Kale, oranges, sardines, oatmeal, soy milk/soybeans, salmon, white beans, dried figs, turnip greens, almonds, broccoli, tofu.  Please stop by to see Shirlee Limerick on your way out. The name of the medication we discussed is Prolia.

## 2011-06-17 ENCOUNTER — Other Ambulatory Visit: Payer: Medicare Other

## 2011-06-18 ENCOUNTER — Other Ambulatory Visit: Payer: Self-pay | Admitting: Family Medicine

## 2011-06-18 MED ORDER — CHLORPHENIRAMINE-HYDROCODONE 8-10 MG/5ML PO LQCR: 5.0000 mL | Freq: Two times a day (BID) | ORAL | Status: DC | PRN

## 2011-06-18 MED ORDER — AZITHROMYCIN 250 MG PO TABS: ORAL_TABLET | ORAL | Status: DC

## 2011-06-20 ENCOUNTER — Other Ambulatory Visit: Payer: Medicare Other

## 2011-07-01 ENCOUNTER — Ambulatory Visit
Admission: RE | Admit: 2011-07-01 | Discharge: 2011-07-01 | Disposition: A | Discharge status: Home / Self Care | Payer: Medicare Other | Source: Ambulatory Visit | Attending: Family Medicine | Admitting: Family Medicine

## 2011-07-01 DIAGNOSIS — Z780 Asymptomatic menopausal state: Secondary | ICD-10-CM

## 2011-07-03 ENCOUNTER — Ambulatory Visit (INDEPENDENT_AMBULATORY_CARE_PROVIDER_SITE_OTHER): Payer: Medicare Other | Admitting: Family Medicine

## 2011-07-03 ENCOUNTER — Encounter: Payer: Self-pay | Admitting: Family Medicine

## 2011-07-03 DIAGNOSIS — M810 Age-related osteoporosis without current pathological fracture: Secondary | ICD-10-CM

## 2011-07-03 NOTE — Progress Notes (Signed)
Subjective:    Patient ID: Deanna Cobb, female    DOB: [birth year], 76 y.o.   MRN: [id number]  HPI 76 yo female here to discuss bone density results.    DEXA from 07/01/2011: Lumbar spine T score -2.3, left femur -2.9. Pt takes Calcium 1200 mg daily, 800 IU of Vitamin D and tries to incorporate Calcium and Vit D in her diet.  She has never had a fracture or fall. Very physically active.  She was told she needed fosamax in past but she cannot take it due to severe reflux and colitis.  Patient Active Problem List  Diagnoses  . HYPOTHYROIDISM  . HYPERLIPIDEMIA TYPE IIB / III  . MITRAL VALVE PROLAPSE  . GLUTEN ENTEROPATHY  . Colitis  . History of TIAs  . Routine general medical examination at a health care facility  . Cerumen impaction  . Osteoporosis   Past Medical History  Diagnosis Date  . Mitral valve prolapse   . Coronary atherosclerosis     D/T lipid rich plaque  . TIA (transient ischemic attack) 04/2005  . Hypothyroidism   . History of colonoscopy    Past Surgical History  Procedure Date  . Abdominal hysterectomy 1972   History  Substance Use Topics  . Smoking status: Never Smoker   . Smokeless tobacco: Not on file  . Alcohol Use: No   Family History  Problem Relation Age of Onset  . Cancer Mother   . Alcohol abuse Brother    Allergies  Allergen Reactions  . Penicillins     REACTION: hives   Current Outpatient Prescriptions on File Prior to Visit  Medication Sig Dispense Refill  . aspirin 81 MG tablet Take 81 mg by mouth daily.        . calcium gluconate 500 MG tablet Take 1,000 mg by mouth 2 (two) times daily.        . cholecalciferol (VITAMIN D) 1000 UNITS tablet Take 1,000 Units by mouth daily.        Marland Kitchen EST ESTROGENS-METHYLTEST DS PO Take 1 capsule by mouth daily.        Bennetta Laos Factor (INTRINSI B12-FOLATE) 800-500-20 MCG-MCG-MG TABS Take 1 tablet by mouth once a week.        . progesterone (PROMETRIUM) 100 MG capsule Take 100 mg by  mouth daily.        Marland Kitchen thyroid (ARMOUR) 15 MG tablet Take 1 tablet (15 mg total) by mouth daily.  30 tablet  3   The PMH, PSH, Social History, Family History, Medications, and allergies have been reviewed in [facility], and have been updated if relevant.    Review of Systems    See HPI Objective:   Physical Exam BP 110/70  Pulse 60  Temp(Src) 98 F (36.7 C) (Oral)  Ht 5\' 3"  (1.6 m)  Wt 98 lb (44.453 kg)  BMI 17.36 kg/m2  General:  Well-developed,well-nourished,in no acute distress; alert,appropriate and cooperative throughout examination Head:  normocephalic and atraumatic.   Eyes:  vision grossly intact, pupils equal, pupils round, and pupils reactive to light.   Psych:  Cognition and judgment appear intact. Alert and cooperative with normal attention span and concentration. No apparent delusions, illusions, hallucinations     Assessment & Plan:   1. Osteoporosis    Deteriorated. >15 min spent with face to face with patient, >50% counseling and/or coordinating care. Will order Prolia given patient's GI issues. Plan to check labs in 6 months- calcium, BMET and cholesterol. The patient indicates  understanding of these issues and agrees with the plan.

## 2011-07-03 NOTE — Patient Instructions (Signed)
We will call you when we receive your Prolia injection. Please make a follow up appointment to see me in 6 months- we will order labs and give your second injection.   To figure out dietary calcium: 300 mg/day from all non dairy foods plus 300 mg per cup of milk, other dairy, or fortified juice.  Non dairy foods that contain calcium:  Kale, oranges, sardines, oatmeal, soy milk/soybeans, salmon, white beans, dried figs, turnip greens, almonds, broccoli, tofu.

## 2011-07-09 ENCOUNTER — Ambulatory Visit: Payer: Medicare Other | Admitting: Internal Medicine

## 2011-07-15 ENCOUNTER — Encounter: Payer: Self-pay | Admitting: Internal Medicine

## 2011-07-15 ENCOUNTER — Ambulatory Visit (INDEPENDENT_AMBULATORY_CARE_PROVIDER_SITE_OTHER): Payer: Medicare Other | Admitting: Internal Medicine

## 2011-07-15 VITALS — BP 124/68 | HR 68 | Ht 63.0 in | Wt 97.8 lb

## 2011-07-15 DIAGNOSIS — R933 Abnormal findings on diagnostic imaging of other parts of digestive tract: Secondary | ICD-10-CM

## 2011-07-15 DIAGNOSIS — K900 Celiac disease: Secondary | ICD-10-CM

## 2011-07-15 DIAGNOSIS — K589 Irritable bowel syndrome without diarrhea: Secondary | ICD-10-CM

## 2011-07-15 MED ORDER — OMEPRAZOLE MAGNESIUM 20 MG PO TBEC: 20.0000 mg | DELAYED_RELEASE_TABLET | Freq: Every day | ORAL | Status: DC

## 2011-07-15 NOTE — Progress Notes (Signed)
Deanna Cobb 03-13-1935 MRN [id number]    History of Present Illness:  This is a 76 year old white female with dyspepsia and history of irritable bowel syndrome for 30 years. She has recently seen a holistic doctor and was told to be sensitive to gluten. She put herself on a strict gluten-free diet 3 weeks ago and has experienced marked improvement in her diarrhea and bloating. Her sprue profile was negative prior to going on gluten-free diet.  CT scan of the abdomen in September 2012 while patient was having abdominal pain and diarrhea a showed  segmental colitis in the sigmoid colon. She responded to Cipro and Flagyl for 7 days with complete resolution of her diarrhea. Stool cultures were not obtained at that time. She also has a history of osteoporosis and hyperthyroidism. Her weight has decreased on gluten-free diet 3 pounds from usual 104 pounds to currently 97 pounds. She had a colonoscopy by [facility] in April 2010. This was a normal exam    Past Medical History  Diagnosis Date  . Mitral valve prolapse   . Coronary atherosclerosis     D/T lipid rich plaque  . TIA (transient ischemic attack) 04/2005  . Hypothyroidism   . Sarcoidosis 1966  . Osteoporosis   . Internal hemorrhoids   . Colitis 2012   Past Surgical History  Procedure Date  . Abdominal hysterectomy 1972  . Tonsillectomy     reports that she has never smoked. She has never used smokeless tobacco. She reports that she drinks alcohol. She reports that she does not use illicit drugs. family history includes Alcohol abuse in her brother and Throat cancer in her mother. Allergies  Allergen Reactions  . Penicillins     REACTION: hives        Review of Systems:Positive for dyspepsia, bloating, indigestion and food intolerance   The remainder of the 10 point ROS is negative except as outlined in H&P   Physical Exam: General appearance  Well developed, in no distress. Eyes- non icteric. HEENT nontraumatic,  normocephalic. Mouth no lesions, tongue papillated, no cheilosis. Neck supple without adenopathy, thyroid not enlarged, no carotid bruits, no JVD. Lungs Clear to auscultation bilaterally. Cor normal S1, normal S2, regular rhythm, no murmur,  quiet precordium. Abdomen:Soft abdomen, nontender. No tympany. No distention. Liver edge at costal margin   Rectal: soft Hemoccult negative stool  Extremities no pedal edema. Skin no lesions. Neurological alert and oriented x 3. Psychological normal mood and affect.  Assessment and Plan:   history of irritable bowel syndrome with recent improvement on gluten-free diet. He has been improved as far as abdominal pain and diarrhea is concerned after taking course of Flagyl and Cipro. Her sprue profile has been negative. The appearance of the op segmental colitis on CT scan is nonspecific and could have represented ischemic colitis or diverticulitis.  Ongoing dyspepsia. CT scan of the abdomen showed normal gallbladder and common bile duct. She is having some pain in the right scapula which is most likely musculoskeletal. She also reports tarry stool on one occasion. But she is Hemoccult-negative on today's exam. She should have a small bowel biopsy to rule out fluoroscopy within villous atrophy. She has been on B12 supplements. We will schedule for upper endoscopy and start empirically on Prilosec 20 mg daily   07/15/2011 [name]

## 2011-07-15 NOTE — Patient Instructions (Signed)
You have been scheduled for an endoscopy. Please follow written instructions given to you at your visit today. We have given you samples of Prilosec to take once daily. CC: Dr Ruthe Mannan

## 2011-07-16 ENCOUNTER — Ambulatory Visit (AMBULATORY_SURGERY_CENTER): Payer: Medicare Other | Admitting: Internal Medicine

## 2011-07-16 ENCOUNTER — Encounter: Payer: Self-pay | Admitting: Internal Medicine

## 2011-07-16 VITALS — BP 136/71 | HR 73 | Temp 97.6°F | Resp 18 | Ht 63.0 in | Wt 97.0 lb

## 2011-07-16 DIAGNOSIS — R1013 Epigastric pain: Secondary | ICD-10-CM

## 2011-07-16 DIAGNOSIS — K3189 Other diseases of stomach and duodenum: Secondary | ICD-10-CM

## 2011-07-16 DIAGNOSIS — K900 Celiac disease: Secondary | ICD-10-CM

## 2011-07-16 DIAGNOSIS — K2940 Chronic atrophic gastritis without bleeding: Secondary | ICD-10-CM

## 2011-07-16 DIAGNOSIS — A048 Other specified bacterial intestinal infections: Secondary | ICD-10-CM

## 2011-07-16 MED ORDER — SODIUM CHLORIDE 0.9 % IV SOLN: 500.0000 mL | INTRAVENOUS | Status: DC

## 2011-07-16 NOTE — Patient Instructions (Signed)
Normal EGD  Await biopsy results  Continue Prilosec samples given yesterday

## 2011-07-17 ENCOUNTER — Telehealth: Payer: Self-pay | Admitting: *Deleted

## 2011-07-17 NOTE — Telephone Encounter (Signed)
Voicemail message left

## 2011-07-21 ENCOUNTER — Encounter: Payer: Self-pay | Admitting: Internal Medicine

## 2011-07-28 ENCOUNTER — Telehealth: Payer: Self-pay | Admitting: Internal Medicine

## 2011-07-28 ENCOUNTER — Ambulatory Visit (INDEPENDENT_AMBULATORY_CARE_PROVIDER_SITE_OTHER): Payer: Medicare Other | Admitting: *Deleted

## 2011-07-28 ENCOUNTER — Other Ambulatory Visit (INDEPENDENT_AMBULATORY_CARE_PROVIDER_SITE_OTHER): Payer: Medicare Other

## 2011-07-28 DIAGNOSIS — M810 Age-related osteoporosis without current pathological fracture: Secondary | ICD-10-CM

## 2011-07-28 DIAGNOSIS — E039 Hypothyroidism, unspecified: Secondary | ICD-10-CM

## 2011-07-28 LAB — T4, FREE: Free T4: 0.68 ng/dL (ref 0.60–1.60)

## 2011-07-28 LAB — TSH: TSH: 1.15 u[IU]/mL (ref 0.35–5.50)

## 2011-07-28 MED ORDER — DENOSUMAB 60 MG/ML ~~LOC~~ SOLN
60.0000 mg | Freq: Once | SUBCUTANEOUS | Status: AC
  Administered 2011-07-28: 60 mg via SUBCUTANEOUS

## 2011-07-28 NOTE — Telephone Encounter (Signed)
Left a message for patient to call me. 

## 2011-07-28 NOTE — Telephone Encounter (Signed)
Patient received a letter to call to get antibiotics for h.pylori. She is allergic to Penicillin. Please, advise.

## 2011-07-28 NOTE — Telephone Encounter (Signed)
PPI bid, Bioxin 500mg  po tid and Flagyl 500mg  po tid x 10 days

## 2011-07-28 NOTE — Progress Notes (Signed)
Prolia injection given during nurse visit today.

## 2011-07-29 MED ORDER — METRONIDAZOLE 250 MG PO TABS: ORAL_TABLET | ORAL | Status: DC

## 2011-07-29 MED ORDER — OMEPRAZOLE MAGNESIUM 20 MG PO TBEC: DELAYED_RELEASE_TABLET | ORAL | Status: DC

## 2011-07-29 MED ORDER — CLARITHROMYCIN 500 MG PO TABS: ORAL_TABLET | ORAL | Status: DC

## 2011-07-29 NOTE — Telephone Encounter (Signed)
Patient wants to know if she needs to schedule a f/u with Dr. Juanda Chance. She also wants to know if she should continue Prilosec daily after the 10 days. Please, advise.

## 2011-07-29 NOTE — Telephone Encounter (Signed)
Please continue Prelosec 20 mg/day for total of 4 weeks. Please make an OV with me, first available

## 2011-07-29 NOTE — Telephone Encounter (Signed)
Rx sent pharmacy. Left a message for patient to call me for instructions.

## 2011-08-05 NOTE — Telephone Encounter (Signed)
I have advised patient to continue Prilosec 20 mg daily x 4 weeks. I have also advised her that Dr Juanda Chance would like to see her in the office for follow up. Patient has scheduled an appointment for 08/26/11. She verbalizes understanding to continue Prilosec x 4 weeks.

## 2011-08-21 ENCOUNTER — Encounter: Payer: Self-pay | Admitting: *Deleted

## 2011-08-26 ENCOUNTER — Ambulatory Visit (INDEPENDENT_AMBULATORY_CARE_PROVIDER_SITE_OTHER): Payer: Medicare Other | Admitting: Internal Medicine

## 2011-08-26 ENCOUNTER — Encounter: Payer: Self-pay | Admitting: Internal Medicine

## 2011-08-26 VITALS — BP 100/60 | HR 72 | Ht 63.0 in | Wt 100.0 lb

## 2011-08-26 DIAGNOSIS — K3189 Other diseases of stomach and duodenum: Secondary | ICD-10-CM

## 2011-08-26 DIAGNOSIS — K589 Irritable bowel syndrome without diarrhea: Secondary | ICD-10-CM

## 2011-08-26 NOTE — Progress Notes (Signed)
Deanna Cobb 1935/05/04 MRN [id number]    History of Present Illness:  This is a 76 year old white female with irritable bowel syndrome and dyspepsia. She is on a self-imposed gluten-free diet based on an evaluation by a holistic doctor whom patient visited in the recent past. Her gluten profile was negative and a small bowel biopsy on a recent upper endoscopy was negative as well. Gastric biopsies for H. Pylori were positive and she was treated with triple therapy. She is feeling 100% better on Prilosec 20 mg daily. Her CT scan of the abdomen in September 2012 showed sigmoid colitis which was treated empirically with Cipro and Flagyl. Her last colonoscopy in April 2010 was normal. Her weight has increased 3 pounds from about 97 to 100 pounds today. She is satisfied with her progress and wants to continue on a gluten-free diet despite negative serologies and biopsies.   Past Medical History  Diagnosis Date  . Mitral valve prolapse   . Coronary atherosclerosis     D/T lipid rich plaque  . TIA (transient ischemic attack) 04/2005  . Hypothyroidism   . Sarcoidosis 1966  . Osteoporosis   . Internal hemorrhoids   . Colitis 2012  . Allergy     multiple food allergies  . Asthma     as a child  . GERD (gastroesophageal reflux disease)   . Heart murmur   . Hyperlipidemia   . H. pylori infection 2012   Past Surgical History  Procedure Date  . Abdominal hysterectomy 1972  . Tonsillectomy   . Lymph node biopsy 1966    reports that she has never smoked. She has never used smokeless tobacco. She reports that she drinks alcohol. She reports that she does not use illicit drugs. family history includes Alcohol abuse in her brother and Throat cancer in her mother. Allergies  Allergen Reactions  . Penicillins     REACTION: hives        Review of Systems: Denies bloating indigestion dysphagia abdominal pain  The remainder of the 10 point ROS is negative except as outlined in  H&P   Physical Exam: General appearance  Well developed, in no distress. Eyes- non icteric. HEENT nontraumatic, normocephalic. Mouth no lesions, tongue papillated, no cheilosis. Neck supple without adenopathy, thyroid not enlarged, no carotid bruits, no JVD. Lungs Clear to auscultation bilaterally. Cor normal S1, normal S2, regular rhythm, no murmur,  quiet precordium. Abdomen: Soft, nontender. Rectal: Not done Extremities no pedal edema. Skin no lesions. Neurological alert and oriented x 3. Psychological normal mood and affect.  Assessment and Plan:  Problem #1 dyspepsia. This is most likely functional. She is status post upper endoscopy and treatment for H. pylori. An evaluation was negative for sprue but patient wants to continue on a gluten-free diet.  Problem #2 neoplastic screening. Patient's last colonoscopy in April 2010 by Dr. Kinnie Scales was normal. Her next exam will be due in 2020.   08/26/2011 [name]

## 2011-08-26 NOTE — Patient Instructions (Signed)
CC: Dr Dayton Martes

## 2011-10-01 ENCOUNTER — Ambulatory Visit: Payer: Medicare Other | Admitting: Family Medicine

## 2011-10-01 ENCOUNTER — Other Ambulatory Visit (INDEPENDENT_AMBULATORY_CARE_PROVIDER_SITE_OTHER): Payer: Medicare Other

## 2011-10-01 DIAGNOSIS — E782 Mixed hyperlipidemia: Secondary | ICD-10-CM

## 2011-10-01 LAB — HEPATIC FUNCTION PANEL
ALT: 20 U/L (ref 0–35)
Alkaline Phosphatase: 38 U/L — ABNORMAL LOW (ref 39–117)
Bilirubin, Direct: 0.1 mg/dL (ref 0.0–0.3)
Total Protein: 6.7 g/dL (ref 6.0–8.3)

## 2011-10-02 ENCOUNTER — Encounter: Payer: Self-pay | Admitting: *Deleted

## 2011-10-13 ENCOUNTER — Other Ambulatory Visit: Payer: Self-pay | Admitting: *Deleted

## 2011-10-13 MED ORDER — THYROID 15 MG PO TABS: 15.0000 mg | ORAL_TABLET | Freq: Every day | ORAL | Status: DC

## 2011-12-09 DIAGNOSIS — E559 Vitamin D deficiency, unspecified: Secondary | ICD-10-CM | POA: Diagnosis not present

## 2011-12-09 DIAGNOSIS — K589 Irritable bowel syndrome without diarrhea: Secondary | ICD-10-CM | POA: Diagnosis not present

## 2011-12-09 DIAGNOSIS — N951 Menopausal and female climacteric states: Secondary | ICD-10-CM | POA: Diagnosis not present

## 2011-12-09 DIAGNOSIS — E039 Hypothyroidism, unspecified: Secondary | ICD-10-CM | POA: Diagnosis not present

## 2011-12-09 DIAGNOSIS — R5383 Other fatigue: Secondary | ICD-10-CM | POA: Diagnosis not present

## 2012-02-04 ENCOUNTER — Ambulatory Visit (INDEPENDENT_AMBULATORY_CARE_PROVIDER_SITE_OTHER): Payer: Medicare Other | Admitting: *Deleted

## 2012-02-04 DIAGNOSIS — M810 Age-related osteoporosis without current pathological fracture: Secondary | ICD-10-CM

## 2012-02-04 MED ORDER — DENOSUMAB 60 MG/ML ~~LOC~~ SOLN
60.0000 mg | Freq: Once | SUBCUTANEOUS | Status: AC
  Administered 2012-02-04: 60 mg via SUBCUTANEOUS

## 2012-04-15 ENCOUNTER — Other Ambulatory Visit: Payer: Self-pay | Admitting: Family Medicine

## 2012-04-15 ENCOUNTER — Telehealth: Payer: Self-pay

## 2012-04-15 DIAGNOSIS — Z1231 Encounter for screening mammogram for malignant neoplasm of breast: Secondary | ICD-10-CM

## 2012-04-15 NOTE — Telephone Encounter (Signed)
Pt can go to sleep but waking up 4:30 - 5:30 am and cannot go back to sleep; pt states no stress but feeling very tired due to lack of rest. Pt going out of country next week for 9 days and would like med called to [facility] for sleep.Please advise.

## 2012-04-15 NOTE — Telephone Encounter (Signed)
rx called to pharmacy and patient advised 

## 2012-04-15 NOTE — Telephone Encounter (Signed)
ambien 10 mg, 1/2 to 1 tab po qhs prn insomnia, #15, 0 refills

## 2012-04-19 ENCOUNTER — Encounter: Payer: Self-pay | Admitting: Family Medicine

## 2012-04-19 ENCOUNTER — Telehealth: Payer: Self-pay | Admitting: Family Medicine

## 2012-04-19 ENCOUNTER — Ambulatory Visit (INDEPENDENT_AMBULATORY_CARE_PROVIDER_SITE_OTHER): Payer: Medicare Other | Admitting: Family Medicine

## 2012-04-19 VITALS — BP 108/60 | HR 60 | Temp 99.7°F | Wt 94.0 lb

## 2012-04-19 DIAGNOSIS — J069 Acute upper respiratory infection, unspecified: Secondary | ICD-10-CM | POA: Diagnosis not present

## 2012-04-19 MED ORDER — AZITHROMYCIN 250 MG PO TABS: ORAL_TABLET | ORAL | Status: AC

## 2012-04-19 NOTE — Patient Instructions (Addendum)
Take Zpack as directed.  Drink lots of fluids.  Treat sympotmatically with Mucinex, nasal saline irrigation, and Tylenol/Ibuprofen.Cough suppressant at night.  Have a wonderful time in [place].

## 2012-04-19 NOTE — Progress Notes (Signed)
SUBJECTIVE:  Deanna Cobb is a 77 y.o. female who complains of coryza, congestion, sneezing, sore throat, dry cough, fever with Tmax 102.3 and bilateral sinus pain for 5 days. She denies a history of anorexia and chest pain and denies a history of asthma. Patient denies smoke cigarettes.   Patient Active Problem List  Diagnosis  . HYPOTHYROIDISM  . HYPERLIPIDEMIA TYPE IIB / III  . MITRAL VALVE PROLAPSE  . GLUTEN ENTEROPATHY  . Colitis  . History of TIAs  . Routine general medical examination at a health care facility  . Cerumen impaction  . Osteoporosis  . URI (upper respiratory infection)   Past Medical History  Diagnosis Date  . Mitral valve prolapse   . Coronary atherosclerosis     D/T lipid rich plaque  . TIA (transient ischemic attack) 04/2005  . Hypothyroidism   . Sarcoidosis 1966  . Osteoporosis   . Internal hemorrhoids   . Colitis 2012  . Allergy     multiple food allergies  . Asthma     as a child  . GERD (gastroesophageal reflux disease)   . Heart murmur   . Hyperlipidemia   . H. pylori infection 2012   Past Surgical History  Procedure Date  . Abdominal hysterectomy 1972  . Tonsillectomy   . Lymph node biopsy 1966   History  Substance Use Topics  . Smoking status: Never Smoker   . Smokeless tobacco: Never Used  . Alcohol Use: Yes     3-4 glasses of wine a week   Family History  Problem Relation Age of Onset  . Throat cancer Mother   . Alcohol abuse Brother    Allergies  Allergen Reactions  . Penicillins     REACTION: hives   Current Outpatient Prescriptions on File Prior to Visit  Medication Sig Dispense Refill  . aspirin 81 MG tablet Take 81 mg by mouth daily.        . calcium gluconate 500 MG tablet Take 1,000 mg by mouth 2 (two) times daily.        . cholecalciferol (VITAMIN D) 1000 UNITS tablet Take 1,000 Units by mouth daily.       Marland Kitchen EST ESTROGENS-METHYLTEST DS PO Take 1 capsule by mouth daily.        Deanna Cobb Factor  (INTRINSI B12-FOLATE) 800-500-20 MCG-MCG-MG TABS Take 1 tablet by mouth daily.       Marland Kitchen omeprazole (PRILOSEC OTC) 20 MG tablet Take one BID x 10 days then back to daily  30 tablet  0  . thyroid (ARMOUR) 15 MG tablet Take 1 tablet (15 mg total) by mouth daily.  30 tablet  3   Current Facility-Administered Medications on File Prior to Visit  Medication Dose Route Frequency Provider Last Rate Last Dose  . DISCONTD: 0.9 %  sodium chloride infusion  500 mL Intravenous Continuous Hart Carwin, MD       The PMH, PSH, Social History, Family History, Medications, and allergies have been reviewed in [facility], and have been updated if relevant.  OBJECTIVE: BP 108/60  Pulse 60  Temp 99.7 F (37.6 C)  Wt 94 lb (42.638 kg)  She appears well, vital signs are as noted. Ears normal.  Throat and pharynx normal.  Neck supple. No adenopathy in the neck. Nose is congested. Sinuses non tender. The chest is clear, without wheezes or rales.  ASSESSMENT:  viral upper respiratory illness  PLAN: She is leaving the country in a few days. Given rx for  Zpack to fill if symptoms do not improve. The patient indicates understanding of these issues and agrees with the plan. Symptomatic therapy suggested: push fluids, rest and return office visit prn if symptoms persist or worsen.Call or return to clinic prn if these symptoms worsen or fail to improve as anticipated.

## 2012-04-19 NOTE — Telephone Encounter (Signed)
Caller: [name]/Patient; Patient Name: Deanna Cobb; PCP: Ruthe Mannan [facility]); Best Callback Phone Number: [phone].  Dry cough and fever started Sat 8/10.  Some coughing spasms.  Temp to 102.3 yesterday 8/11 and is  100 today.  Not sleeping well due to cough.  Has flight later this week, concerned about cough during flight.  Triaged in Cough Guideline - Disposition:  See provider Within 24 Hours due to fever, cough. Appt 10:15 am today Dr Dayton Martes.

## 2012-05-25 ENCOUNTER — Ambulatory Visit: Payer: Medicare Other

## 2012-06-01 ENCOUNTER — Ambulatory Visit
Admission: RE | Admit: 2012-06-01 | Discharge: 2012-06-01 | Disposition: A | Discharge status: Home / Self Care | Payer: Medicare Other | Source: Ambulatory Visit | Attending: Family Medicine | Admitting: Family Medicine

## 2012-06-01 DIAGNOSIS — Z1231 Encounter for screening mammogram for malignant neoplasm of breast: Secondary | ICD-10-CM | POA: Diagnosis not present

## 2012-06-10 DIAGNOSIS — L821 Other seborrheic keratosis: Secondary | ICD-10-CM | POA: Diagnosis not present

## 2012-06-10 DIAGNOSIS — D235 Other benign neoplasm of skin of trunk: Secondary | ICD-10-CM | POA: Diagnosis not present

## 2012-06-15 ENCOUNTER — Encounter: Payer: Self-pay | Admitting: Family Medicine

## 2012-06-15 ENCOUNTER — Ambulatory Visit (INDEPENDENT_AMBULATORY_CARE_PROVIDER_SITE_OTHER): Payer: Medicare Other | Admitting: Family Medicine

## 2012-06-15 VITALS — BP 110/82 | HR 60 | Temp 97.6°F | Ht 62.75 in | Wt 95.0 lb

## 2012-06-15 DIAGNOSIS — Z0000 Encounter for general adult medical examination without abnormal findings: Secondary | ICD-10-CM

## 2012-06-15 DIAGNOSIS — E782 Mixed hyperlipidemia: Secondary | ICD-10-CM | POA: Diagnosis not present

## 2012-06-15 DIAGNOSIS — A048 Other specified bacterial intestinal infections: Secondary | ICD-10-CM

## 2012-06-15 DIAGNOSIS — M810 Age-related osteoporosis without current pathological fracture: Secondary | ICD-10-CM

## 2012-06-15 DIAGNOSIS — Z23 Encounter for immunization: Secondary | ICD-10-CM | POA: Diagnosis not present

## 2012-06-15 DIAGNOSIS — Z8673 Personal history of transient ischemic attack (TIA), and cerebral infarction without residual deficits: Secondary | ICD-10-CM

## 2012-06-15 DIAGNOSIS — Z780 Asymptomatic menopausal state: Secondary | ICD-10-CM | POA: Diagnosis not present

## 2012-06-15 DIAGNOSIS — E039 Hypothyroidism, unspecified: Secondary | ICD-10-CM

## 2012-06-15 LAB — COMPREHENSIVE METABOLIC PANEL
Alkaline Phosphatase: 37 U/L — ABNORMAL LOW (ref 39–117)
Glucose, Bld: 90 mg/dL (ref 70–99)
Sodium: 139 mEq/L (ref 135–145)
Total Bilirubin: 1.4 mg/dL — ABNORMAL HIGH (ref 0.3–1.2)
Total Protein: 6.8 g/dL (ref 6.0–8.3)

## 2012-06-15 LAB — LIPID PANEL
HDL: 76.2 mg/dL (ref >39.00)
Total CHOL/HDL Ratio: 3
Triglycerides: 60 mg/dL (ref 0.0–149.0)

## 2012-06-15 LAB — LDL CHOLESTEROL, DIRECT: Direct LDL: 109 mg/dL

## 2012-06-15 NOTE — Patient Instructions (Addendum)
Let's recheck your cholesterol today. Perhaps we could start Pravachol instead of Crestor to minimize side effects.  Please try a lower dose of melatonin (1 mg tablet).  Please stop by to the lab on your way out.

## 2012-06-15 NOTE — Progress Notes (Signed)
HPI Deanna Cobb is a very pleasant 77 yo female here today for medicare annual wellness visit.  Followed by Dr. Daleen Squibb for management of her history of a TIA , mitral valve prolapse, and hyperlipidemia. Taking low-dose aspirin.  She stopped taking Crestor because she felt it was making her "mind cloudy.:  Lab Results  Component Value Date   CHOL 205* 10/01/2011   HDL 71.40 10/01/2011   LDLCALC 77 06/04/2011   LDLDIRECT 124.3 10/01/2011   TRIG 43.0 10/01/2011   CHOLHDL 3 10/01/2011    Hypothyroidism- on armour.  Lab Results  Component Value Date   TSH 1.15 07/28/2011    Colitis- symptoms have resolved.   Followed by Dr. Juanda Chance.  Did test positive for H.pylori last year- she was treated.  Since she was asymptomatic at time of diagnosis, she would like to know if the infection was eradicated.  Bone density last year consistent with osteoporosis. On prolia.  Next injection due in 07/2012 (has already received two doses). No noticeable side effects.  I have personally reviewed the Medicare Annual Wellness questionnaire and have noted 1. The patient's medical and social history 2. Their use of alcohol, tobacco or illicit drugs 3. Their current medications and supplements 4. The patient's functional ability including ADL's, fall risks, home safety risks and hearing or visual             impairment. 5. Diet and physical activities 6. Evidence for depression or mood disorders  Past Medical History  Diagnosis Date  . Mitral valve prolapse   . Coronary atherosclerosis     D/T lipid rich plaque  . TIA (transient ischemic attack) 04/2005  . Hypothyroidism   . Sarcoidosis 1966  . Osteoporosis   . Internal hemorrhoids   . Colitis 2012  . Allergy     multiple food allergies  . Asthma     as a child  . GERD (gastroesophageal reflux disease)   . Heart murmur   . Hyperlipidemia   . H. pylori infection 2012    Past Surgical History  Procedure Date  . Abdominal hysterectomy 1972  .  Tonsillectomy   . Lymph node biopsy 1966    Family History  Problem Relation Age of Onset  . Throat cancer Mother   . Alcohol abuse Brother     History   Social History  . Marital Status: Married    Spouse Name: N/A    Number of Children: 4  . Years of Education: N/A   Occupational History  . Retired-RN    Social History Main Topics  . Smoking status: Never Smoker   . Smokeless tobacco: Never Used  . Alcohol Use: Yes     3-4 glasses of wine a week  . Drug Use: No  . Sexually Active: Not on file   Other Topics Concern  . Not on file   Social History Narrative  . No narrative on file    Allergies  Allergen Reactions  . Penicillins     REACTION: hives    Current Outpatient Prescriptions  Medication Sig Dispense Refill  . aspirin 81 MG tablet Take 81 mg by mouth daily.        . calcium gluconate 500 MG tablet Take 1,000 mg by mouth 2 (two) times daily.        . cholecalciferol (VITAMIN D) 1000 UNITS tablet Take 1,000 Units by mouth daily.       Marland Kitchen EST ESTROGENS-METHYLTEST DS PO Take 1 capsule by mouth daily.        Marland Kitchen  Folate-B12-Intrinsic Factor (INTRINSI B12-FOLATE) 800-500-20 MCG-MCG-MG TABS Take 1 tablet by mouth daily.       Marland Kitchen omeprazole (PRILOSEC OTC) 20 MG tablet Take one BID x 10 days then back to daily  30 tablet  0  . thyroid (ARMOUR) 15 MG tablet Take 1 tablet (15 mg total) by mouth daily.  30 tablet  3    ROS Negative other than HPI.  Patient reports no  vision/ hearing changes,anorexia, weight change, fever ,adenopathy, persistant / recurrent hoarseness, swallowing issues, chest pain, edema,persistant / recurrent cough, hemoptysis, dyspnea(rest, exertional, paroxysmal nocturnal), gastrointestinal  bleeding (melena, rectal bleeding), abdominal pain, excessive heart burn, GU symptoms(dysuria, hematuria, pyuria, voiding/incontinence  Issues) syncope, focal weakness, severe memory loss, concerning skin lesions, depression, anxiety, abnormal bruising/bleeding,  major joint swelling, breast masses or abnormal vaginal bleeding.     PE BP 110/82  Pulse 60  Temp 97.6 F (36.4 C)  Ht 5' 2.75" (1.594 m)  Wt 95 lb (43.092 kg)  BMI 16.96 kg/m2  General Appearance: well developed, well nourished in no acute distress HEENT: symmetrical face, PERRLA, good dentition Neck: no JVD, thyromegaly, or adenopathy, trachea midline Chest: symmetric without deformity Cardiac: PMI non-displaced, RRR, normal S1, S2, no gallop or murmur Lung: clear to ausculation and percussion Vascular: all pulses full without bruits  Abdominal: nondistended, nontender, good bowel sounds, no HSM, no bruits Extremities: no cyanosis, clubbing or edema, no sign of DVT, no varicosities  Skin: normal color, no rashes Neuro: alert and oriented x 3, non-focal Pysch: normal affect  Assessment and Plan:  1. Routine general medical examination at a health care facility  The patients weight, height, BMI and visual acuity have been recorded in the chart I have made referrals, counseling and provided education to the patient based review of the above and I have provided the pt with a written personalized care plan for preventive services.  Comprehensive metabolic panel  2. History of TIAs  On ASA- see below.   3. HYPERLIPIDEMIA TYPE IIB / III  Recheck lipids today. If elevated, will start pravachol (hopefully less side effects). The patient indicates understanding of these issues and agrees with the plan.  Lipid Panel  4. HYPOTHYROIDISM  Stable on current dose of armour.   5. Osteoporosis  On prolia. Next DEXA due in 2014. DG Bone Density  6. H. pylori infection   S/p treatment. Check stool antigen test to confirm eradication. Helicobacter pylori antigen det, stool  7. Need for prophylactic vaccination and inoculation against influenza  Flu vaccine greater than or equal to 3yo preservative free IM

## 2012-06-17 ENCOUNTER — Other Ambulatory Visit: Payer: Self-pay | Admitting: Family Medicine

## 2012-06-17 DIAGNOSIS — A048 Other specified bacterial intestinal infections: Secondary | ICD-10-CM | POA: Diagnosis not present

## 2012-07-12 DIAGNOSIS — D509 Iron deficiency anemia, unspecified: Secondary | ICD-10-CM | POA: Diagnosis not present

## 2012-07-12 DIAGNOSIS — E349 Endocrine disorder, unspecified: Secondary | ICD-10-CM | POA: Diagnosis not present

## 2012-07-12 DIAGNOSIS — N951 Menopausal and female climacteric states: Secondary | ICD-10-CM | POA: Diagnosis not present

## 2012-07-12 DIAGNOSIS — E890 Postprocedural hypothyroidism: Secondary | ICD-10-CM | POA: Diagnosis not present

## 2012-07-12 DIAGNOSIS — R5383 Other fatigue: Secondary | ICD-10-CM | POA: Diagnosis not present

## 2012-07-12 DIAGNOSIS — R5381 Other malaise: Secondary | ICD-10-CM | POA: Diagnosis not present

## 2012-07-12 DIAGNOSIS — K589 Irritable bowel syndrome without diarrhea: Secondary | ICD-10-CM | POA: Diagnosis not present

## 2012-07-12 DIAGNOSIS — E559 Vitamin D deficiency, unspecified: Secondary | ICD-10-CM | POA: Diagnosis not present

## 2012-07-12 DIAGNOSIS — H2510 Age-related nuclear cataract, unspecified eye: Secondary | ICD-10-CM | POA: Diagnosis not present

## 2012-09-17 ENCOUNTER — Telehealth: Payer: Self-pay

## 2012-09-17 NOTE — Telephone Encounter (Signed)
Deanna Cobb is ordering.  Advised patient I will call her back to schedule once prolia comes in.

## 2012-09-17 NOTE — Telephone Encounter (Signed)
Pt left v/m requesting appt to be made for prolia injection.

## 2012-10-14 ENCOUNTER — Ambulatory Visit (INDEPENDENT_AMBULATORY_CARE_PROVIDER_SITE_OTHER): Payer: Medicare Other | Admitting: *Deleted

## 2012-10-14 DIAGNOSIS — M810 Age-related osteoporosis without current pathological fracture: Secondary | ICD-10-CM

## 2012-10-14 MED ORDER — DENOSUMAB 60 MG/ML ~~LOC~~ SOLN
60.0000 mg | Freq: Once | SUBCUTANEOUS | Status: AC
  Administered 2012-10-14: 60 mg via SUBCUTANEOUS

## 2012-10-27 DIAGNOSIS — E559 Vitamin D deficiency, unspecified: Secondary | ICD-10-CM | POA: Diagnosis not present

## 2012-10-27 DIAGNOSIS — R5381 Other malaise: Secondary | ICD-10-CM | POA: Diagnosis not present

## 2012-11-25 DIAGNOSIS — M79609 Pain in unspecified limb: Secondary | ICD-10-CM | POA: Diagnosis not present

## 2013-01-12 ENCOUNTER — Other Ambulatory Visit: Payer: Self-pay

## 2013-01-12 ENCOUNTER — Encounter (HOSPITAL_COMMUNITY): Payer: Self-pay | Admitting: Emergency Medicine

## 2013-01-12 ENCOUNTER — Emergency Department (HOSPITAL_COMMUNITY)
Admission: EM | Admit: 2013-01-12 | Discharge: 2013-01-12 | Disposition: A | Discharge status: Home / Self Care | Payer: Medicare Other | Attending: Emergency Medicine | Admitting: Emergency Medicine

## 2013-01-12 ENCOUNTER — Emergency Department (HOSPITAL_COMMUNITY): Payer: Medicare Other

## 2013-01-12 DIAGNOSIS — J45909 Unspecified asthma, uncomplicated: Secondary | ICD-10-CM | POA: Diagnosis not present

## 2013-01-12 DIAGNOSIS — R55 Syncope and collapse: Secondary | ICD-10-CM | POA: Diagnosis not present

## 2013-01-12 DIAGNOSIS — Z8673 Personal history of transient ischemic attack (TIA), and cerebral infarction without residual deficits: Secondary | ICD-10-CM | POA: Diagnosis not present

## 2013-01-12 DIAGNOSIS — Z7982 Long term (current) use of aspirin: Secondary | ICD-10-CM | POA: Diagnosis not present

## 2013-01-12 DIAGNOSIS — M25519 Pain in unspecified shoulder: Secondary | ICD-10-CM | POA: Diagnosis not present

## 2013-01-12 DIAGNOSIS — K219 Gastro-esophageal reflux disease without esophagitis: Secondary | ICD-10-CM | POA: Diagnosis not present

## 2013-01-12 DIAGNOSIS — Z880 Allergy status to penicillin: Secondary | ICD-10-CM | POA: Insufficient documentation

## 2013-01-12 DIAGNOSIS — Z8679 Personal history of other diseases of the circulatory system: Secondary | ICD-10-CM | POA: Insufficient documentation

## 2013-01-12 DIAGNOSIS — E039 Hypothyroidism, unspecified: Secondary | ICD-10-CM | POA: Insufficient documentation

## 2013-01-12 DIAGNOSIS — Z8739 Personal history of other diseases of the musculoskeletal system and connective tissue: Secondary | ICD-10-CM | POA: Insufficient documentation

## 2013-01-12 DIAGNOSIS — R110 Nausea: Secondary | ICD-10-CM | POA: Diagnosis not present

## 2013-01-12 DIAGNOSIS — Z8619 Personal history of other infectious and parasitic diseases: Secondary | ICD-10-CM | POA: Insufficient documentation

## 2013-01-12 DIAGNOSIS — Z79899 Other long term (current) drug therapy: Secondary | ICD-10-CM | POA: Insufficient documentation

## 2013-01-12 DIAGNOSIS — R209 Unspecified disturbances of skin sensation: Secondary | ICD-10-CM | POA: Diagnosis not present

## 2013-01-12 DIAGNOSIS — R011 Cardiac murmur, unspecified: Secondary | ICD-10-CM | POA: Diagnosis not present

## 2013-01-12 DIAGNOSIS — E785 Hyperlipidemia, unspecified: Secondary | ICD-10-CM | POA: Insufficient documentation

## 2013-01-12 DIAGNOSIS — Z8719 Personal history of other diseases of the digestive system: Secondary | ICD-10-CM | POA: Insufficient documentation

## 2013-01-12 DIAGNOSIS — M25512 Pain in left shoulder: Secondary | ICD-10-CM

## 2013-01-12 DIAGNOSIS — M19019 Primary osteoarthritis, unspecified shoulder: Secondary | ICD-10-CM | POA: Diagnosis not present

## 2013-01-12 LAB — COMPREHENSIVE METABOLIC PANEL
CO2: 30 mEq/L (ref 19–32)
Calcium: 9.9 mg/dL (ref 8.4–10.5)
Creatinine, Ser: 0.66 mg/dL (ref 0.50–1.10)
GFR calc Af Amer: >90 mL/min (ref >90)
GFR calc non Af Amer: 84 mL/min — ABNORMAL LOW (ref >90)
Glucose, Bld: 104 mg/dL — ABNORMAL HIGH (ref 70–99)

## 2013-01-12 LAB — CBC WITH DIFFERENTIAL/PLATELET
Basophils Absolute: 0 10*3/uL (ref 0.0–0.1)
Eosinophils Relative: 1 % (ref 0–5)
HCT: 41 % (ref 36.0–46.0)
Lymphocytes Relative: 31 % (ref 12–46)
MCV: 92.6 fL (ref 78.0–100.0)
Monocytes Absolute: 0.5 10*3/uL (ref 0.1–1.0)
RDW: 12.7 % (ref 11.5–15.5)
WBC: 5.4 10*3/uL (ref 4.0–10.5)

## 2013-01-12 LAB — TROPONIN I: Troponin I: <0.3 ng/mL (ref <0.30)

## 2013-01-12 MED ORDER — IBUPROFEN 400 MG PO TABS: 400.0000 mg | ORAL_TABLET | Freq: Four times a day (QID) | ORAL | Status: DC | PRN

## 2013-01-12 NOTE — ED Provider Notes (Signed)
History     CSN: [id number]  Arrival date & time 01/12/13  1126   First MD Initiated Contact with Patient 01/12/13 1357      Chief Complaint  Patient presents with  . Arm Pain    2 week hx of intermittent l/arm pain  . Nausea    1 week hx of nausea    (Consider location/radiation/quality/duration/timing/severity/associated sxs/prior treatment) HPI Comments: Pt comes in with cc of shoulder pain primarily. Pt has been having some left shoulder pain, radiating to the elbow over the past few days. The pain in intermittent, usually unprovoked, and there is no hx of trauma or similar pain before. The pain is dull in nature. Pt also reports that during her bible study today, she got really warm, flushed, and felt dizzy. No syncope, no chest pain, palpitations. No hx of PE, DVT, no cardiac dz hx.  The history is provided by the patient.    Past Medical History  Diagnosis Date  . Mitral valve prolapse   . Coronary atherosclerosis     D/T lipid rich plaque  . TIA (transient ischemic attack) 04/2005  . Hypothyroidism   . Sarcoidosis 1966  . Osteoporosis   . Internal hemorrhoids   . Colitis 2012  . Allergy     multiple food allergies  . Asthma     as a child  . GERD (gastroesophageal reflux disease)   . Heart murmur   . Hyperlipidemia   . H. pylori infection 2012    Past Surgical History  Procedure Laterality Date  . Abdominal hysterectomy  1972  . Tonsillectomy    . Lymph node biopsy  1966    Family History  Problem Relation Age of Onset  . Throat cancer Mother   . Alcohol abuse Brother     History  Substance Use Topics  . Smoking status: Never Smoker   . Smokeless tobacco: Never Used  . Alcohol Use: Yes     Comment: 3-4 glasses of wine a week    OB History   Grav Para Term Preterm Abortions TAB SAB Ect Mult Living                  Review of Systems  Constitutional: Negative for activity change.  HENT: Negative for neck pain.   Respiratory: Negative for  shortness of breath.   Cardiovascular: Negative for chest pain.  Gastrointestinal: Negative for nausea, vomiting and abdominal pain.  Genitourinary: Negative for dysuria.  Musculoskeletal: Positive for arthralgias.  Neurological: Negative for headaches.    Allergies  Penicillins  Home Medications   Current Outpatient Rx  Name  Route  Sig  Dispense  Refill  . acidophilus (RISAQUAD) CAPS   Oral   Take 1 capsule by mouth daily.         Marland Kitchen aspirin 81 MG tablet   Oral   Take 81 mg by mouth daily.           . cholecalciferol (VITAMIN D) 1000 UNITS tablet   Oral   Take 1,000 Units by mouth daily.          . Coenzyme Q10 (COQ-10) 100 MG CAPS      Take one by mouth daily         . DHEA 10 MG CAPS      Take one by mouth daily         . EST ESTROGENS-METHYLTEST DS PO   Oral   Take 1 capsule by mouth daily.           Marland Kitchen  Folate-B12-Intrinsic Factor (INTRINSI B12-FOLATE) 800-500-20 MCG-MCG-MG TABS   Oral   Take 1 tablet by mouth daily.          Marland Kitchen thyroid (ARMOUR) 15 MG tablet   Oral   Take 1 tablet (15 mg total) by mouth daily.   30 tablet   3   . calcium gluconate 500 MG tablet   Oral   Take 500 mg by mouth every Monday, Wednesday, and Friday.          Marland Kitchen ibuprofen (ADVIL,MOTRIN) 400 MG tablet   Oral   Take 1 tablet (400 mg total) by mouth every 6 (six) hours as needed for pain.   30 tablet   0   . omeprazole (PRILOSEC OTC) 20 MG tablet      Take one BID x 10 days then back to daily   30 tablet   0     BP 121/55  Pulse 81  Temp(Src) 98.4 F (36.9 C) (Oral)  Ht 5\' 2"  (1.575 m)  Wt 97 lb (43.999 kg)  BMI 17.74 kg/m2  SpO2 100%  Physical Exam  Nursing note and vitals reviewed. Constitutional: She is oriented to person, place, and time. She appears well-developed and well-nourished.  HENT:  Head: Normocephalic and atraumatic.  Eyes: EOM are normal. Pupils are equal, round, and reactive to light.  Neck: Neck supple.  Cardiovascular: Normal  rate, regular rhythm and normal heart sounds.   No murmur heard. Pulmonary/Chest: Effort normal. No respiratory distress.  Abdominal: Soft. She exhibits no distension. There is no tenderness. There is no rebound and no guarding.  Musculoskeletal:  GH shoulder tenderness, and patient has tenderness with ROM - mostly over the place of the shoulder. No crepitus, no evidence of infection.  Neurological: She is alert and oriented to person, place, and time.  Skin: Skin is warm and dry.    ED Course  Procedures (including critical care time)  Labs Reviewed  COMPREHENSIVE METABOLIC PANEL - Abnormal; Notable for the following:    Glucose, Bld 104 (*)    GFR calc non Af Amer 84 (*)    All other components within normal limits  CBC WITH DIFFERENTIAL  TROPONIN I   Dg Shoulder Left  01/12/2013  *RADIOLOGY REPORT*  Clinical Data: Left shoulder pain radiating to the elbow for several weeks.  LEFT SHOULDER - 2+ VIEW  Comparison:  None.  Findings:  There is no evidence of fracture or dislocation.  There is no evidence of arthropathy or other focal bone abnormality, other than minor narrowing and slight bony overgrowth of the acromioclavicular joint.  Soft tissues are unremarkable.  IMPRESSION: Mild AC joint degenerative change, otherwise negative.   Original Report Authenticated By: Davonna Belling, M.D.      1. Shoulder arthralgia, left   2. Vasovagal reaction       MDM   Date: 01/12/2013  Rate: 73  Rhythm: normal sinus rhythm  QRS Axis: normal  Intervals: normal  ST/T Wave abnormalities: normal  Conduction Disutrbances: none  Narrative Interpretation: unremarkable  Pt has shoulder pain - which appears to be musculoskeletal type pain as it is reproducible. Will get XRAY. OA vs. Soft tissue injury in the ddx.  The bible study episode appears to be vasovagal type episode. No syncope. Her SF syncope score is 0. Will get screening labs only, EKG is WNL.      Derwood Kaplan,  MD 01/12/13 1650

## 2013-01-12 NOTE — ED Notes (Signed)
Pt felt flushed from the neck up , this am. Recent 2 week hx of l/arm intermittent pain. Episode of nausea 1 week ago. Denies chest pain

## 2013-02-08 DIAGNOSIS — M5430 Sciatica, unspecified side: Secondary | ICD-10-CM | POA: Diagnosis not present

## 2013-02-08 DIAGNOSIS — M999 Biomechanical lesion, unspecified: Secondary | ICD-10-CM | POA: Diagnosis not present

## 2013-02-09 DIAGNOSIS — M999 Biomechanical lesion, unspecified: Secondary | ICD-10-CM | POA: Diagnosis not present

## 2013-02-09 DIAGNOSIS — M5430 Sciatica, unspecified side: Secondary | ICD-10-CM | POA: Diagnosis not present

## 2013-02-14 DIAGNOSIS — M999 Biomechanical lesion, unspecified: Secondary | ICD-10-CM | POA: Diagnosis not present

## 2013-02-14 DIAGNOSIS — M5430 Sciatica, unspecified side: Secondary | ICD-10-CM | POA: Diagnosis not present

## 2013-02-16 DIAGNOSIS — M999 Biomechanical lesion, unspecified: Secondary | ICD-10-CM | POA: Diagnosis not present

## 2013-02-16 DIAGNOSIS — M5430 Sciatica, unspecified side: Secondary | ICD-10-CM | POA: Diagnosis not present

## 2013-03-01 DIAGNOSIS — M5430 Sciatica, unspecified side: Secondary | ICD-10-CM | POA: Diagnosis not present

## 2013-03-01 DIAGNOSIS — M999 Biomechanical lesion, unspecified: Secondary | ICD-10-CM | POA: Diagnosis not present

## 2013-03-04 DIAGNOSIS — M5430 Sciatica, unspecified side: Secondary | ICD-10-CM | POA: Diagnosis not present

## 2013-03-04 DIAGNOSIS — M999 Biomechanical lesion, unspecified: Secondary | ICD-10-CM | POA: Diagnosis not present

## 2013-03-07 DIAGNOSIS — S81009A Unspecified open wound, unspecified knee, initial encounter: Secondary | ICD-10-CM | POA: Diagnosis not present

## 2013-03-07 DIAGNOSIS — M79609 Pain in unspecified limb: Secondary | ICD-10-CM | POA: Diagnosis not present

## 2013-03-14 DIAGNOSIS — M999 Biomechanical lesion, unspecified: Secondary | ICD-10-CM | POA: Diagnosis not present

## 2013-03-14 DIAGNOSIS — M5430 Sciatica, unspecified side: Secondary | ICD-10-CM | POA: Diagnosis not present

## 2013-03-16 ENCOUNTER — Encounter: Payer: Self-pay | Admitting: Family Medicine

## 2013-03-16 ENCOUNTER — Ambulatory Visit (INDEPENDENT_AMBULATORY_CARE_PROVIDER_SITE_OTHER): Payer: Medicare Other | Admitting: Family Medicine

## 2013-03-16 VITALS — BP 150/80 | HR 80 | Temp 97.9°F | Wt 97.0 lb

## 2013-03-16 DIAGNOSIS — W07XXXA Fall from chair, initial encounter: Secondary | ICD-10-CM | POA: Insufficient documentation

## 2013-03-16 DIAGNOSIS — W07XXXD Fall from chair, subsequent encounter: Secondary | ICD-10-CM

## 2013-03-16 DIAGNOSIS — G4700 Insomnia, unspecified: Secondary | ICD-10-CM | POA: Insufficient documentation

## 2013-03-16 DIAGNOSIS — Z4802 Encounter for removal of sutures: Secondary | ICD-10-CM

## 2013-03-16 MED ORDER — SULFAMETHOXAZOLE-TMP DS 800-160 MG PO TABS: 1.0000 | ORAL_TABLET | Freq: Two times a day (BID) | ORAL | Status: DC

## 2013-03-16 MED ORDER — ZOLPIDEM TARTRATE 5 MG PO TABS: 5.0000 mg | ORAL_TABLET | Freq: Every evening | ORAL | Status: DC | PRN

## 2013-03-16 NOTE — Patient Instructions (Addendum)
Good to see you. Please take Bactrim as directed- 1 tablet twice daily x 7 days. Ok to shower.  I would keep that bottom area covered. Call me with an update, sooner if things look worse. Enjoy your family!

## 2013-03-16 NOTE — Progress Notes (Signed)
Subjective:    Patient ID: Deanna Cobb, female    DOB: Jul 18, 1935, 78 y.o.   MRN: [id number]  HPI Very pleasant 78 yo female here for follow up fall and suture removal.  [place] and [place] a chair and cut her leg on chair, requiring 30 stitches. They have been place for 10 days.  She was given 7 day course of kelfex 500 mg twice daily.  She has notice increased warmth and some drainage on area of laceration that was not sutured at bottom. No fevers or chills.  It is painful- increased insomnia since fall.  Td up to date - 03/2009  PCN allergic. Patient Active Problem List   Diagnosis Date Noted  . Accidental fall from chair   . Visit for suture removal   . Insomnia   . H. pylori infection 06/15/2012  . URI (upper respiratory infection) 04/19/2012  . Osteoporosis 07/03/2011  . Routine general medical examination at a health care facility 06/11/2011  . Cerumen impaction 06/11/2011  . History of TIAs 06/02/2011  . Colitis 05/20/2011  . HYPOTHYROIDISM 03/18/2010  . GLUTEN ENTEROPATHY 03/18/2010  . HYPERLIPIDEMIA TYPE IIB / III 02/13/2009  . MITRAL VALVE PROLAPSE 02/02/2009   Past Medical History  Diagnosis Date  . Mitral valve prolapse   . Coronary atherosclerosis     D/T lipid rich plaque  . TIA (transient ischemic attack) 04/2005  . Hypothyroidism   . Sarcoidosis 1966  . Osteoporosis   . Internal hemorrhoids   . Colitis 2012  . Allergy     multiple food allergies  . Asthma     as a child  . GERD (gastroesophageal reflux disease)   . Heart murmur   . Hyperlipidemia   . H. pylori infection 2012  . Accidental fall from chair   . Visit for suture removal   . Insomnia    Past Surgical History  Procedure Laterality Date  . Abdominal hysterectomy  1972  . Tonsillectomy    . Lymph node biopsy  1966   History  Substance Use Topics  . Smoking status: Never Smoker   . Smokeless tobacco: Never Used  . Alcohol Use: Yes     Comment: 3-4  glasses of wine a week   Family History  Problem Relation Age of Onset  . Throat cancer Mother   . Alcohol abuse Brother    Allergies  Allergen Reactions  . Penicillins     REACTION: hives   Current Outpatient Prescriptions on File Prior to Visit  Medication Sig Dispense Refill  . acidophilus (RISAQUAD) CAPS Take 1 capsule by mouth daily.      Marland Kitchen aspirin 81 MG tablet Take 81 mg by mouth daily.        . calcium gluconate 500 MG tablet Take 500 mg by mouth every Monday, Wednesday, and Friday.       . cholecalciferol (VITAMIN D) 1000 UNITS tablet Take 1,000 Units by mouth daily.       . Coenzyme Q10 (COQ-10) 100 MG CAPS Take one by mouth daily      . DHEA 10 MG CAPS Take one by mouth daily      . EST ESTROGENS-METHYLTEST DS PO Take 1 capsule by mouth daily.        Bennetta Laos Factor (INTRINSI B12-FOLATE) 800-500-20 MCG-MCG-MG TABS Take 1 tablet by mouth daily.       Marland Kitchen ibuprofen (ADVIL,MOTRIN) 400 MG tablet Take 1 tablet (400 mg total)  by mouth every 6 (six) hours as needed for pain.  30 tablet  0  . omeprazole (PRILOSEC OTC) 20 MG tablet Take one BID x 10 days then back to daily  30 tablet  0  . thyroid (ARMOUR) 15 MG tablet Take 1 tablet (15 mg total) by mouth daily.  30 tablet  3   No current facility-administered medications on file prior to visit.   The PMH, PSH, Social History, Family History, Medications, and allergies have been reviewed in [facility], and have been updated if relevant.    Review of Systems    See HPI Objective:   Physical Exam  Constitutional: She appears well-developed and well-nourished. No distress.  HENT:  Head: Normocephalic.  Skin: Skin is warm and dry.     Psychiatric: She has a normal mood and affect. Her behavior is normal. Judgment and thought content normal.   BP 150/80  Pulse 80  Temp(Src) 97.9 F (36.6 C)  Wt 97 lb (43.999 kg)  BMI 17.74 kg/m2      Assessment & Plan:  1. Accidental fall from chair, subsequent encounter See  below.  2. Visit for suture removal Sutures removed without issue. She does have indications of cellulitis. Will treat with additional abx- bactrim ds- 1 tab po twice daily x 7 days. Call or return to clinic prn if these symptoms worsen or fail to improve as anticipated. The patient indicates understanding of these issues and agrees with the plan.   3. Insomnia  The problem of recurrent insomnia is discussed. Avoidance of caffeine sources is strongly encouraged. Sleep hygiene issues are reviewed. The use of sedative hypnotics for temporary relief is appropriate; we discussed the addictive nature of these drugs, and a one-time only prescription for prn use of a hypnotic is given, to use no more than 3 times per week for 2-3 weeks.

## 2013-03-18 DIAGNOSIS — M999 Biomechanical lesion, unspecified: Secondary | ICD-10-CM | POA: Diagnosis not present

## 2013-03-18 DIAGNOSIS — M5430 Sciatica, unspecified side: Secondary | ICD-10-CM | POA: Diagnosis not present

## 2013-03-21 DIAGNOSIS — M999 Biomechanical lesion, unspecified: Secondary | ICD-10-CM | POA: Diagnosis not present

## 2013-03-21 DIAGNOSIS — M5430 Sciatica, unspecified side: Secondary | ICD-10-CM | POA: Diagnosis not present

## 2013-04-06 DIAGNOSIS — M999 Biomechanical lesion, unspecified: Secondary | ICD-10-CM | POA: Diagnosis not present

## 2013-04-06 DIAGNOSIS — M5430 Sciatica, unspecified side: Secondary | ICD-10-CM | POA: Diagnosis not present

## 2013-04-08 DIAGNOSIS — M5430 Sciatica, unspecified side: Secondary | ICD-10-CM | POA: Diagnosis not present

## 2013-04-08 DIAGNOSIS — M999 Biomechanical lesion, unspecified: Secondary | ICD-10-CM | POA: Diagnosis not present

## 2013-04-19 DIAGNOSIS — M999 Biomechanical lesion, unspecified: Secondary | ICD-10-CM | POA: Diagnosis not present

## 2013-04-19 DIAGNOSIS — M5430 Sciatica, unspecified side: Secondary | ICD-10-CM | POA: Diagnosis not present

## 2013-05-03 DIAGNOSIS — M999 Biomechanical lesion, unspecified: Secondary | ICD-10-CM | POA: Diagnosis not present

## 2013-05-03 DIAGNOSIS — M5430 Sciatica, unspecified side: Secondary | ICD-10-CM | POA: Diagnosis not present

## 2013-05-12 DIAGNOSIS — E559 Vitamin D deficiency, unspecified: Secondary | ICD-10-CM | POA: Diagnosis not present

## 2013-05-12 DIAGNOSIS — E349 Endocrine disorder, unspecified: Secondary | ICD-10-CM | POA: Diagnosis not present

## 2013-05-12 DIAGNOSIS — E039 Hypothyroidism, unspecified: Secondary | ICD-10-CM | POA: Diagnosis not present

## 2013-05-12 DIAGNOSIS — R5381 Other malaise: Secondary | ICD-10-CM | POA: Diagnosis not present

## 2013-05-12 DIAGNOSIS — R7989 Other specified abnormal findings of blood chemistry: Secondary | ICD-10-CM | POA: Diagnosis not present

## 2013-05-12 DIAGNOSIS — D518 Other vitamin B12 deficiency anemias: Secondary | ICD-10-CM | POA: Diagnosis not present

## 2013-05-31 DIAGNOSIS — M5430 Sciatica, unspecified side: Secondary | ICD-10-CM | POA: Diagnosis not present

## 2013-05-31 DIAGNOSIS — M999 Biomechanical lesion, unspecified: Secondary | ICD-10-CM | POA: Diagnosis not present

## 2013-06-09 DIAGNOSIS — L723 Sebaceous cyst: Secondary | ICD-10-CM | POA: Diagnosis not present

## 2013-06-09 DIAGNOSIS — L821 Other seborrheic keratosis: Secondary | ICD-10-CM | POA: Diagnosis not present

## 2013-06-17 DIAGNOSIS — R05 Cough: Secondary | ICD-10-CM | POA: Diagnosis not present

## 2013-06-17 DIAGNOSIS — H6120 Impacted cerumen, unspecified ear: Secondary | ICD-10-CM | POA: Diagnosis not present

## 2013-06-17 DIAGNOSIS — J301 Allergic rhinitis due to pollen: Secondary | ICD-10-CM | POA: Diagnosis not present

## 2013-07-05 DIAGNOSIS — Z23 Encounter for immunization: Secondary | ICD-10-CM | POA: Diagnosis not present

## 2013-07-13 DIAGNOSIS — H2510 Age-related nuclear cataract, unspecified eye: Secondary | ICD-10-CM | POA: Diagnosis not present

## 2013-08-01 ENCOUNTER — Other Ambulatory Visit: Payer: Self-pay

## 2013-08-01 DIAGNOSIS — Z1231 Encounter for screening mammogram for malignant neoplasm of breast: Secondary | ICD-10-CM

## 2013-09-27 ENCOUNTER — Ambulatory Visit
Admission: RE | Admit: 2013-09-27 | Discharge: 2013-09-27 | Disposition: A | Discharge status: Home / Self Care | Payer: Medicare Other | Source: Ambulatory Visit

## 2013-09-27 DIAGNOSIS — Z1231 Encounter for screening mammogram for malignant neoplasm of breast: Secondary | ICD-10-CM

## 2013-10-04 ENCOUNTER — Other Ambulatory Visit: Payer: Self-pay | Admitting: Internal Medicine

## 2013-10-04 MED ORDER — OSELTAMIVIR PHOSPHATE 75 MG PO CAPS: 75.0000 mg | ORAL_CAPSULE | Freq: Every day | ORAL | Status: DC

## 2013-10-27 DIAGNOSIS — H2510 Age-related nuclear cataract, unspecified eye: Secondary | ICD-10-CM | POA: Diagnosis not present

## 2013-11-11 DIAGNOSIS — R5381 Other malaise: Secondary | ICD-10-CM | POA: Diagnosis not present

## 2013-11-11 DIAGNOSIS — E349 Endocrine disorder, unspecified: Secondary | ICD-10-CM | POA: Diagnosis not present

## 2013-11-11 DIAGNOSIS — N951 Menopausal and female climacteric states: Secondary | ICD-10-CM | POA: Diagnosis not present

## 2013-11-11 DIAGNOSIS — R6889 Other general symptoms and signs: Secondary | ICD-10-CM | POA: Diagnosis not present

## 2013-11-11 DIAGNOSIS — R5383 Other fatigue: Secondary | ICD-10-CM | POA: Diagnosis not present

## 2013-11-11 DIAGNOSIS — R7989 Other specified abnormal findings of blood chemistry: Secondary | ICD-10-CM | POA: Diagnosis not present

## 2013-11-11 DIAGNOSIS — E039 Hypothyroidism, unspecified: Secondary | ICD-10-CM | POA: Diagnosis not present

## 2013-11-18 DIAGNOSIS — M5430 Sciatica, unspecified side: Secondary | ICD-10-CM | POA: Diagnosis not present

## 2013-11-18 DIAGNOSIS — M999 Biomechanical lesion, unspecified: Secondary | ICD-10-CM | POA: Diagnosis not present

## 2013-11-23 DIAGNOSIS — M999 Biomechanical lesion, unspecified: Secondary | ICD-10-CM | POA: Diagnosis not present

## 2013-11-23 DIAGNOSIS — M5430 Sciatica, unspecified side: Secondary | ICD-10-CM | POA: Diagnosis not present

## 2013-12-13 DIAGNOSIS — M5430 Sciatica, unspecified side: Secondary | ICD-10-CM | POA: Diagnosis not present

## 2013-12-13 DIAGNOSIS — M999 Biomechanical lesion, unspecified: Secondary | ICD-10-CM | POA: Diagnosis not present

## 2013-12-20 DIAGNOSIS — M999 Biomechanical lesion, unspecified: Secondary | ICD-10-CM | POA: Diagnosis not present

## 2013-12-20 DIAGNOSIS — M5430 Sciatica, unspecified side: Secondary | ICD-10-CM | POA: Diagnosis not present

## 2013-12-21 ENCOUNTER — Ambulatory Visit: Payer: Self-pay | Admitting: Ophthalmology

## 2013-12-21 DIAGNOSIS — Z01810 Encounter for preprocedural cardiovascular examination: Secondary | ICD-10-CM | POA: Diagnosis not present

## 2013-12-21 DIAGNOSIS — H2510 Age-related nuclear cataract, unspecified eye: Secondary | ICD-10-CM | POA: Diagnosis not present

## 2013-12-21 DIAGNOSIS — I499 Cardiac arrhythmia, unspecified: Secondary | ICD-10-CM | POA: Diagnosis not present

## 2014-01-03 ENCOUNTER — Ambulatory Visit: Payer: Self-pay | Admitting: Ophthalmology

## 2014-01-03 DIAGNOSIS — Z79899 Other long term (current) drug therapy: Secondary | ICD-10-CM | POA: Diagnosis not present

## 2014-01-03 DIAGNOSIS — H269 Unspecified cataract: Secondary | ICD-10-CM | POA: Diagnosis not present

## 2014-01-03 DIAGNOSIS — M810 Age-related osteoporosis without current pathological fracture: Secondary | ICD-10-CM | POA: Diagnosis not present

## 2014-01-03 DIAGNOSIS — Z880 Allergy status to penicillin: Secondary | ICD-10-CM | POA: Diagnosis not present

## 2014-01-03 DIAGNOSIS — E7800 Pure hypercholesterolemia, unspecified: Secondary | ICD-10-CM | POA: Diagnosis not present

## 2014-01-03 DIAGNOSIS — Z7982 Long term (current) use of aspirin: Secondary | ICD-10-CM | POA: Diagnosis not present

## 2014-01-03 DIAGNOSIS — M1990 Unspecified osteoarthritis, unspecified site: Secondary | ICD-10-CM | POA: Diagnosis not present

## 2014-01-03 DIAGNOSIS — H2510 Age-related nuclear cataract, unspecified eye: Secondary | ICD-10-CM | POA: Diagnosis not present

## 2014-01-03 DIAGNOSIS — R011 Cardiac murmur, unspecified: Secondary | ICD-10-CM | POA: Diagnosis not present

## 2014-01-03 DIAGNOSIS — Z8673 Personal history of transient ischemic attack (TIA), and cerebral infarction without residual deficits: Secondary | ICD-10-CM | POA: Diagnosis not present

## 2014-01-10 DIAGNOSIS — M999 Biomechanical lesion, unspecified: Secondary | ICD-10-CM | POA: Diagnosis not present

## 2014-01-10 DIAGNOSIS — M5430 Sciatica, unspecified side: Secondary | ICD-10-CM | POA: Diagnosis not present

## 2014-02-07 DIAGNOSIS — M5430 Sciatica, unspecified side: Secondary | ICD-10-CM | POA: Diagnosis not present

## 2014-02-07 DIAGNOSIS — M999 Biomechanical lesion, unspecified: Secondary | ICD-10-CM | POA: Diagnosis not present

## 2014-03-14 DIAGNOSIS — M999 Biomechanical lesion, unspecified: Secondary | ICD-10-CM | POA: Diagnosis not present

## 2014-03-14 DIAGNOSIS — M5430 Sciatica, unspecified side: Secondary | ICD-10-CM | POA: Diagnosis not present

## 2014-04-24 DIAGNOSIS — M5430 Sciatica, unspecified side: Secondary | ICD-10-CM | POA: Diagnosis not present

## 2014-04-24 DIAGNOSIS — M999 Biomechanical lesion, unspecified: Secondary | ICD-10-CM | POA: Diagnosis not present

## 2014-04-25 DIAGNOSIS — D1801 Hemangioma of skin and subcutaneous tissue: Secondary | ICD-10-CM | POA: Diagnosis not present

## 2014-04-25 DIAGNOSIS — L821 Other seborrheic keratosis: Secondary | ICD-10-CM | POA: Diagnosis not present

## 2014-04-25 DIAGNOSIS — D235 Other benign neoplasm of skin of trunk: Secondary | ICD-10-CM | POA: Diagnosis not present

## 2014-04-25 DIAGNOSIS — L570 Actinic keratosis: Secondary | ICD-10-CM | POA: Diagnosis not present

## 2014-05-07 ENCOUNTER — Encounter (HOSPITAL_BASED_OUTPATIENT_CLINIC_OR_DEPARTMENT_OTHER): Payer: Self-pay | Admitting: Emergency Medicine

## 2014-05-07 ENCOUNTER — Emergency Department (HOSPITAL_BASED_OUTPATIENT_CLINIC_OR_DEPARTMENT_OTHER)
Admission: EM | Admit: 2014-05-07 | Discharge: 2014-05-07 | Disposition: A | Discharge status: Home / Self Care | Payer: Medicare Other | Attending: Emergency Medicine | Admitting: Emergency Medicine

## 2014-05-07 DIAGNOSIS — S81009A Unspecified open wound, unspecified knee, initial encounter: Secondary | ICD-10-CM | POA: Insufficient documentation

## 2014-05-07 DIAGNOSIS — Z880 Allergy status to penicillin: Secondary | ICD-10-CM | POA: Insufficient documentation

## 2014-05-07 DIAGNOSIS — Y939 Activity, unspecified: Secondary | ICD-10-CM | POA: Diagnosis not present

## 2014-05-07 DIAGNOSIS — I2510 Atherosclerotic heart disease of native coronary artery without angina pectoris: Secondary | ICD-10-CM | POA: Diagnosis not present

## 2014-05-07 DIAGNOSIS — J45909 Unspecified asthma, uncomplicated: Secondary | ICD-10-CM | POA: Diagnosis not present

## 2014-05-07 DIAGNOSIS — Z8673 Personal history of transient ischemic attack (TIA), and cerebral infarction without residual deficits: Secondary | ICD-10-CM | POA: Insufficient documentation

## 2014-05-07 DIAGNOSIS — M810 Age-related osteoporosis without current pathological fracture: Secondary | ICD-10-CM | POA: Diagnosis not present

## 2014-05-07 DIAGNOSIS — E039 Hypothyroidism, unspecified: Secondary | ICD-10-CM | POA: Diagnosis not present

## 2014-05-07 DIAGNOSIS — S81812A Laceration without foreign body, left lower leg, initial encounter: Secondary | ICD-10-CM

## 2014-05-07 DIAGNOSIS — Z8619 Personal history of other infectious and parasitic diseases: Secondary | ICD-10-CM | POA: Insufficient documentation

## 2014-05-07 DIAGNOSIS — S91009A Unspecified open wound, unspecified ankle, initial encounter: Principal | ICD-10-CM

## 2014-05-07 DIAGNOSIS — Y929 Unspecified place or not applicable: Secondary | ICD-10-CM | POA: Diagnosis not present

## 2014-05-07 DIAGNOSIS — W64XXXA Exposure to other animate mechanical forces, initial encounter: Secondary | ICD-10-CM | POA: Diagnosis not present

## 2014-05-07 DIAGNOSIS — Z79899 Other long term (current) drug therapy: Secondary | ICD-10-CM | POA: Insufficient documentation

## 2014-05-07 DIAGNOSIS — Z7982 Long term (current) use of aspirin: Secondary | ICD-10-CM | POA: Insufficient documentation

## 2014-05-07 DIAGNOSIS — R011 Cardiac murmur, unspecified: Secondary | ICD-10-CM | POA: Diagnosis not present

## 2014-05-07 DIAGNOSIS — S81809A Unspecified open wound, unspecified lower leg, initial encounter: Principal | ICD-10-CM

## 2014-05-07 DIAGNOSIS — K219 Gastro-esophageal reflux disease without esophagitis: Secondary | ICD-10-CM | POA: Insufficient documentation

## 2014-05-07 MED ORDER — CLINDAMYCIN HCL 150 MG PO CAPS: 150.0000 mg | ORAL_CAPSULE | Freq: Four times a day (QID) | ORAL | Status: DC

## 2014-05-07 MED ORDER — HYDROCODONE-ACETAMINOPHEN 5-325 MG PO TABS: 2.0000 | ORAL_TABLET | ORAL | Status: DC | PRN

## 2014-05-07 NOTE — ED Provider Notes (Signed)
CSN: [id number]     Arrival date & time 05/07/14  1656 History   First MD Initiated Contact with Patient 05/07/14 1747     Chief Complaint  Patient presents with  . Extremity Laceration     (Consider location/radiation/quality/duration/timing/severity/associated sxs/prior Treatment) Patient is a 79 y.o. female presenting with skin laceration. The history is provided by the patient. No language interpreter was used.  Laceration Location:  Leg Leg laceration location:  L lower leg Length (cm):  3 Depth:  Through dermis Quality: straight   Bleeding: controlled   Injury mechanism: 4. Pain details:    Quality:  Unable to specify   Severity:  Severe   Timing:  Constant   Progression:  Worsening Foreign body present:  No foreign bodies Relieved by:  Nothing Worsened by:  Nothing tried Ineffective treatments:  None tried Tetanus status:  Up to date  Pt reports her puppy jumped on her and scratched her leg.  Past Medical History  Diagnosis Date  . Mitral valve prolapse   . Coronary atherosclerosis     D/T lipid rich plaque  . TIA (transient ischemic attack) 04/2005  . Hypothyroidism   . Sarcoidosis 1966  . Osteoporosis   . Internal hemorrhoids   . Colitis 2012  . Allergy     multiple food allergies  . Asthma     as a child  . GERD (gastroesophageal reflux disease)   . Heart murmur   . Hyperlipidemia   . H. pylori infection 2012  . Accidental fall from chair   . Visit for suture removal   . Insomnia    Past Surgical History  Procedure Laterality Date  . Abdominal hysterectomy  1972  . Tonsillectomy    . Lymph node biopsy  1966   Family History  Problem Relation Age of Onset  . Throat cancer Mother   . Alcohol abuse Brother    History  Substance Use Topics  . Smoking status: Never Smoker   . Smokeless tobacco: Never Used  . Alcohol Use: Yes     Comment: 3-4 glasses of wine a week   OB History   Grav Para Term Preterm Abortions TAB SAB Ect Mult Living              Review of Systems  Skin: Positive for wound.  All other systems reviewed and are negative.     Allergies  Penicillins  Home Medications   Prior to Admission medications   Medication Sig Start Date End Date Taking? Authorizing Provider  acidophilus (RISAQUAD) CAPS Take 1 capsule by mouth daily.    Historical Provider, MD  aspirin 81 MG tablet Take 81 mg by mouth daily.      Historical Provider, MD  calcium gluconate 500 MG tablet Take 500 mg by mouth every Monday, Wednesday, and Friday.     Historical Provider, MD  cholecalciferol (VITAMIN D) 1000 UNITS tablet Take 1,000 Units by mouth daily.     Historical Provider, MD  clindamycin (CLEOCIN) 150 MG capsule Take 1 capsule (150 mg total) by mouth every 6 (six) hours. 05/07/14   Fransico Meadow, PA-C  Coenzyme Q10 (COQ-10) 100 MG CAPS Take one by mouth daily    Historical Provider, MD  DHEA 10 MG CAPS Take one by mouth daily    Historical Provider, MD  EST ESTROGENS-METHYLTEST DS PO Take 1 capsule by mouth daily.      Historical Provider, MD  Folate-B12-Intrinsic Factor (INTRINSI B12-FOLATE) 176-160-73 MCG-MCG-MG TABS Take 1 tablet  by mouth daily.     Historical Provider, MD  HYDROcodone-acetaminophen (NORCO/VICODIN) 5-325 MG per tablet Take 2 tablets by mouth every 4 (four) hours as needed. 05/07/14   Fransico Meadow, PA-C  ibuprofen (ADVIL,MOTRIN) 400 MG tablet Take 1 tablet (400 mg total) by mouth every 6 (six) hours as needed for pain. 01/12/13   Varney Biles, MD  omeprazole (PRILOSEC OTC) 20 MG tablet Take one BID x 10 days then back to daily 07/29/11   Lafayette Dragon, MD  oseltamivir (TAMIFLU) 75 MG capsule Take 1 capsule (75 mg total) by mouth daily. 10/04/13   Jearld Fenton, NP  sulfamethoxazole-trimethoprim (BACTRIM DS) 800-160 MG per tablet Take 1 tablet by mouth 2 (two) times daily. 03/16/13   Lucille Passy, MD  thyroid (ARMOUR) 15 MG tablet Take 1 tablet (15 mg total) by mouth daily. 10/13/11   Lucille Passy, MD  zolpidem  (AMBIEN) 5 MG tablet Take 1 tablet (5 mg total) by mouth at bedtime as needed for sleep. 03/16/13   Lucille Passy, MD   BP 146/72  Pulse 76  Temp(Src) 98 F (36.7 C) (Oral)  Resp 21  Ht 5\' 3"  (1.6 m)  Wt 97 lb (43.999 kg)  BMI 17.19 kg/m2  SpO2 99% Physical Exam  Nursing note and vitals reviewed. Constitutional: She is oriented to person, place, and time. She appears well-developed and well-nourished.  HENT:  Head: Normocephalic.  Eyes: EOM are normal. Pupils are equal, round, and reactive to light.  Neck: Normal range of motion.  Cardiovascular: Normal rate.   Pulmonary/Chest: Effort normal.  Abdominal: She exhibits no distension.  Musculoskeletal: Normal range of motion.  Neurological: She is alert and oriented to person, place, and time.  Skin:  4 cm laceration left leg  Psychiatric: She has a normal mood and affect.    ED Course  LACERATION REPAIR Date/Time: 05/07/2014 11:24 PM Performed by: Fransico Meadow Authorized by: Fransico Meadow Consent: Verbal consent obtained. Consent given by: patient Patient understanding: patient states understanding of the procedure being performed Body area: lower extremity Location details: left lower leg Laceration length: 4 cm Foreign bodies: no foreign bodies Tendon involvement: none Nerve involvement: none Vascular damage: no Anesthesia: local infiltration Preparation: Patient was prepped and draped in the usual sterile fashion. Irrigation solution: saline Amount of cleaning: standard Debridement: none Degree of undermining: none Skin closure: 5-0 Prolene Number of sutures: 5 Technique: simple Patient tolerance: Patient tolerated the procedure well with no immediate complications.   (including critical care time) Labs Review Labs Reviewed - No data to display  Imaging Review No results found.   EKG Interpretation None      MDM I counseled on infection risk.   Pt's husband is MD (Psychiatrist)  He will help monitor  for infection   Final diagnoses:  Laceration of left leg excluding thigh, initial encounter    Clindamycin Hydrocodone Suture removal in 8 days    Fransico Meadow, PA-C 05/07/14 2326

## 2014-05-07 NOTE — Discharge Instructions (Signed)
Sutured Wound Care °Sutures are stitches that can be used to close wounds. Wound care helps prevent pain and infection.  °HOME CARE INSTRUCTIONS  °· Rest and elevate the injured area until all the pain and swelling are gone. °· Only take over-the-counter or prescription medicines for pain, discomfort, or fever as directed by your caregiver. °· After 48 hours, gently wash the area with mild soap and water once a day, or as directed. Rinse off the soap. Pat the area dry with a clean towel. Do not rub the wound. This may cause bleeding. °· Follow your caregiver's instructions for how often to change the bandage (dressing). Stop using a dressing after 2 days or after the wound stops draining. °· If the dressing sticks, moisten it with soapy water and gently remove it. °· Apply ointment on the wound as directed. °· Avoid stretching a sutured wound. °· Drink enough fluids to keep your urine clear or pale yellow. °· Follow up with your caregiver for suture removal as directed. °· Use sunscreen on your wound for the next 3 to 6 months so the scar will not darken. °SEEK IMMEDIATE MEDICAL CARE IF:  °· Your wound becomes red, swollen, hot, or tender. °· You have increasing pain in the wound. °· You have a red streak that extends from the wound. °· There is pus coming from the wound. °· You have a fever. °· You have shaking chills. °· There is a bad smell coming from the wound. °· You have persistent bleeding from the wound. °MAKE SURE YOU:  °· Understand these instructions. °· Will watch your condition. °· Will get help right away if you are not doing well or get worse. °Document Released: 10/02/2004 Document Revised: 11/17/2011 Document Reviewed: 12/29/2010 °ExitCare® Patient Information ©2015 [employer]. This information is not intended to replace advice given to you by your health care provider. Make sure you discuss any questions you have with your health care provider. ° °

## 2014-05-07 NOTE — ED Provider Notes (Signed)
Medical screening examination/treatment/procedure(s) were conducted as a shared visit with non-physician practitioner(s) and myself.  I personally evaluated the patient during the encounter.   EKG Interpretation None      Patient here with laceration to L posterior leg due to a puppy. NVI distally, simple laceration. Repaired by Alyse Low, PA-C.  Evelina Bucy, MD 05/07/14 [phone]

## 2014-05-07 NOTE — ED Notes (Signed)
Laceration to left leg x 20 minutes ago. Bleeding controlled

## 2014-05-08 ENCOUNTER — Telehealth: Payer: Self-pay | Admitting: Family Medicine

## 2014-05-08 ENCOUNTER — Telehealth: Payer: Self-pay | Admitting: *Deleted

## 2014-05-08 DIAGNOSIS — M999 Biomechanical lesion, unspecified: Secondary | ICD-10-CM | POA: Diagnosis not present

## 2014-05-08 DIAGNOSIS — E039 Hypothyroidism, unspecified: Secondary | ICD-10-CM

## 2014-05-08 DIAGNOSIS — M810 Age-related osteoporosis without current pathological fracture: Secondary | ICD-10-CM

## 2014-05-08 DIAGNOSIS — M5430 Sciatica, unspecified side: Secondary | ICD-10-CM | POA: Diagnosis not present

## 2014-05-08 DIAGNOSIS — E782 Mixed hyperlipidemia: Secondary | ICD-10-CM

## 2014-05-08 NOTE — Telephone Encounter (Signed)
Pt notified of Dr. Tower's comments/recommendations and verbalized understanding  

## 2014-05-08 NOTE — Telephone Encounter (Signed)
Any antibiotic can cause IBS to worsen - they are referring to the risk of C difficile colitis - a type of infection you can get (ironicaly with any antibiotic as well) It that is what they selected - stick with it and update [place] with any problems

## 2014-05-08 NOTE — Telephone Encounter (Signed)
Message copied by Abner Greenspan on Mon May 08, 2014 10:15 PM ------      Message from: Ellamae Sia      Created: Mon May 08, 2014  2:27 PM      Regarding: Lab orders for Tuesday, Sept 1,15       Patient is scheduled for CPX labs, please order future labs, Thanks , Terri                  Dr Hulen Shouts pt ------

## 2014-05-08 NOTE — Telephone Encounter (Signed)
Patient was seen in the ER last night with a laceration and was given Clindamycin. Patient states that she has read the side effects and is concerned about taking it because of her IBS and the fact that she has diarrhea 2-3 times a week. Patient wants to know if there is another antibiotic that she can be prescribed that will not affect her IBS?  Pharmacy [facility].

## 2014-05-09 ENCOUNTER — Other Ambulatory Visit (INDEPENDENT_AMBULATORY_CARE_PROVIDER_SITE_OTHER): Payer: Medicare Other

## 2014-05-09 DIAGNOSIS — E782 Mixed hyperlipidemia: Secondary | ICD-10-CM | POA: Diagnosis not present

## 2014-05-09 DIAGNOSIS — Z0000 Encounter for general adult medical examination without abnormal findings: Secondary | ICD-10-CM | POA: Diagnosis not present

## 2014-05-09 DIAGNOSIS — E039 Hypothyroidism, unspecified: Secondary | ICD-10-CM

## 2014-05-09 DIAGNOSIS — M810 Age-related osteoporosis without current pathological fracture: Secondary | ICD-10-CM

## 2014-05-09 LAB — COMPREHENSIVE METABOLIC PANEL
ALK PHOS: 48 U/L (ref 39–117)
ALT: 14 U/L (ref 0–35)
AST: 19 U/L (ref 0–37)
Albumin: 4 g/dL (ref 3.5–5.2)
BILIRUBIN TOTAL: 1.3 mg/dL — AB (ref 0.2–1.2)
BUN: 18 mg/dL (ref 6–23)
CO2: 30 meq/L (ref 19–32)
CREATININE: 0.8 mg/dL (ref 0.4–1.2)
Calcium: 9.4 mg/dL (ref 8.4–10.5)
Chloride: 105 mEq/L (ref 96–112)
GFR: 74.89 mL/min (ref >60.00)
Glucose, Bld: 89 mg/dL (ref 70–99)
Potassium: 4.3 mEq/L (ref 3.5–5.1)
SODIUM: 142 meq/L (ref 135–145)
Total Protein: 6.4 g/dL (ref 6.0–8.3)

## 2014-05-09 LAB — CBC WITH DIFFERENTIAL/PLATELET
BASOS ABS: 0 10*3/uL (ref 0.0–0.1)
Basophils Relative: 0.6 % (ref 0.0–3.0)
EOS ABS: 0.1 10*3/uL (ref 0.0–0.7)
Eosinophils Relative: 2.6 % (ref 0.0–5.0)
HEMATOCRIT: 42.7 % (ref 36.0–46.0)
HEMOGLOBIN: 14.2 g/dL (ref 12.0–15.0)
LYMPHS ABS: 1.7 10*3/uL (ref 0.7–4.0)
Lymphocytes Relative: 31.5 % (ref 12.0–46.0)
MCHC: 33.2 g/dL (ref 30.0–36.0)
MCV: 97.3 fl (ref 78.0–100.0)
MONO ABS: 0.4 10*3/uL (ref 0.1–1.0)
MONOS PCT: 7.2 % (ref 3.0–12.0)
NEUTROS ABS: 3.1 10*3/uL (ref 1.4–7.7)
Neutrophils Relative %: 58.1 % (ref 43.0–77.0)
PLATELETS: 247 10*3/uL (ref 150.0–400.0)
RBC: 4.39 Mil/uL (ref 3.87–5.11)
RDW: 14.3 % (ref 11.5–15.5)
WBC: 5.4 10*3/uL (ref 4.0–10.5)

## 2014-05-09 LAB — LIPID PANEL
Cholesterol: 184 mg/dL (ref 0–200)
HDL: 66.9 mg/dL (ref >39.00)
LDL CALC: 102 mg/dL — AB (ref 0–99)
NONHDL: 117.1
Total CHOL/HDL Ratio: 3
Triglycerides: 74 mg/dL (ref 0.0–149.0)
VLDL: 14.8 mg/dL (ref 0.0–40.0)

## 2014-05-09 LAB — TSH: TSH: 0.3 u[IU]/mL — ABNORMAL LOW (ref 0.35–4.50)

## 2014-05-09 LAB — VITAMIN D 25 HYDROXY (VIT D DEFICIENCY, FRACTURES): VITD: 81.25 ng/mL (ref 30.00–100.00)

## 2014-05-11 ENCOUNTER — Encounter: Payer: Self-pay | Admitting: Family Medicine

## 2014-05-11 ENCOUNTER — Ambulatory Visit (INDEPENDENT_AMBULATORY_CARE_PROVIDER_SITE_OTHER): Payer: Medicare Other | Admitting: Family Medicine

## 2014-05-11 VITALS — BP 140/80 | HR 69 | Temp 98.0°F | Ht 62.5 in | Wt 94.8 lb

## 2014-05-11 DIAGNOSIS — E782 Mixed hyperlipidemia: Secondary | ICD-10-CM

## 2014-05-11 DIAGNOSIS — Z8673 Personal history of transient ischemic attack (TIA), and cerebral infarction without residual deficits: Secondary | ICD-10-CM

## 2014-05-11 DIAGNOSIS — Z0000 Encounter for general adult medical examination without abnormal findings: Secondary | ICD-10-CM | POA: Diagnosis not present

## 2014-05-11 DIAGNOSIS — Z23 Encounter for immunization: Secondary | ICD-10-CM

## 2014-05-11 DIAGNOSIS — M810 Age-related osteoporosis without current pathological fracture: Secondary | ICD-10-CM

## 2014-05-11 DIAGNOSIS — E039 Hypothyroidism, unspecified: Secondary | ICD-10-CM

## 2014-05-11 DIAGNOSIS — I059 Rheumatic mitral valve disease, unspecified: Secondary | ICD-10-CM

## 2014-05-11 DIAGNOSIS — K529 Noninfective gastroenteritis and colitis, unspecified: Secondary | ICD-10-CM

## 2014-05-11 DIAGNOSIS — K5289 Other specified noninfective gastroenteritis and colitis: Secondary | ICD-10-CM

## 2014-05-11 NOTE — Progress Notes (Signed)
Pre visit review using our clinic review tool, if applicable. No additional management support is needed unless otherwise documented below in the visit note. 

## 2014-05-11 NOTE — Progress Notes (Signed)
HPI Deanna Cobb is a very pleasant 79 yo female here today for medicare annual wellness visit.  I have personally reviewed the Medicare Annual Wellness questionnaire and have noted 1. The patient's medical and social history 2. Their use of alcohol, tobacco or illicit drugs 3. Their current medications and supplements 4. The patient's functional ability including ADL's, fall risks, home safety risks and hearing or visual             impairment. 5. Diet and physical activities 6. Evidence for depression or mood disorders  Zoster 03/16/08 Td 03/29/09 Pneumovax 03/11/09 Mammogram 09/27/13 Colonoscopy- [facility]- 12/18/08- recall in 5 years- but according to Dr. Nichola Sizer note from 08/26/11- she is not due for colonoscopy until 2020.  End of life wishes discussed and updated in Social History.  Just saw her dermatologist, Dr. Koleen Nimrod, last week.  The roster of all physicians providing medical care to patient - is listed in the Snapshot section of the chart.  Was seen in ER 3 days ago for laceration of left leg.  Was given clindamycin.  Was worried about GI side effects but actually, her diarrhea has resolved since she STOPPED taking clindamycin.  Was followed by Dr. Verl Blalock for management of her history of a TIA , mitral valve prolapse, and hyperlipidemia. Taking low-dose aspirin.  She stopped taking Crestor because she felt it was making her "mind cloudy.   Has not been re assigned to new cardiologist.  Lab Results  Component Value Date   CHOL 184 05/09/2014   HDL 66.90 05/09/2014   LDLCALC 102* 05/09/2014   LDLDIRECT 109.0 06/15/2012   TRIG 74.0 05/09/2014   CHOLHDL 3 05/09/2014    Hypothyroidism- on armour.  Denies symptoms of hypo or hyperthyroidism.  Sees a "holistic doctor" who checks her thyroid.  She sees her again in 2 weeks.  Lab Results  Component Value Date   TSH 0.30* 05/09/2014       Past Medical History  Diagnosis Date  . Mitral valve prolapse   . Coronary atherosclerosis     D/T  lipid rich plaque  . TIA (transient ischemic attack) 04/2005  . Hypothyroidism   . Sarcoidosis 1966  . Osteoporosis   . Internal hemorrhoids   . Colitis 2012  . Allergy     multiple food allergies  . Asthma     as a child  . GERD (gastroesophageal reflux disease)   . Heart murmur   . Hyperlipidemia   . H. pylori infection 2012  . Accidental fall from chair   . Visit for suture removal   . Insomnia     Past Surgical History  Procedure Laterality Date  . Abdominal hysterectomy  1972  . Tonsillectomy    . Lymph node biopsy  1966    Family History  Problem Relation Age of Onset  . Throat cancer Mother   . Alcohol abuse Brother     History   Social History  . Marital Status: Married    Spouse Name: N/A    Number of Children: 4  . Years of Education: N/A   Occupational History  . Retired-RN    Social History Main Topics  . Smoking status: Never Smoker   . Smokeless tobacco: Never Used  . Alcohol Use: Yes     Comment: 3-4 glasses of wine a week  . Drug Use: No  . Sexual Activity: Not on file   Other Topics Concern  . Not on file   Social History Narrative  .  No narrative on file    Allergies  Allergen Reactions  . Penicillins     REACTION: hives    Current Outpatient Prescriptions  Medication Sig Dispense Refill  . acidophilus (RISAQUAD) CAPS Take 1 capsule by mouth daily.      . Alpha-Lipoic Acid 600 MG CAPS Take by mouth.      Marland Kitchen aspirin 81 MG tablet Take 81 mg by mouth daily.        . cholecalciferol (VITAMIN D) 1000 UNITS tablet Take 1,000 Units by mouth daily.       . clindamycin (CLEOCIN) 150 MG capsule Take 1 capsule (150 mg total) by mouth every 6 (six) hours.  28 capsule  0  . Coenzyme Q10 (COQ-10) 100 MG CAPS Take one by mouth daily      . DHEA 10 MG CAPS Take one by mouth daily      . EST ESTROGENS-METHYLTEST DS PO Take 1 capsule by mouth daily.        [name] (INTRINSI B12-FOLATE) 423-953-20 MCG-MCG-MG TABS Take 1  tablet by mouth daily.       Marland Kitchen thyroid (ARMOUR) 15 MG tablet Take 1 tablet (15 mg total) by mouth daily.  30 tablet  3   No current facility-administered medications for this visit.    ROS Negative other than HPI.  Patient reports no  vision/ hearing changes,anorexia, weight change, fever ,adenopathy, persistant / recurrent hoarseness, swallowing issues, chest pain, edema,persistant / recurrent cough, hemoptysis, dyspnea(rest, exertional, paroxysmal nocturnal), gastrointestinal  bleeding (melena, rectal bleeding), abdominal pain, excessive heart burn, GU symptoms(dysuria, hematuria, pyuria, voiding/incontinence  Issues) syncope, focal weakness, severe memory loss, concerning skin lesions, depression, anxiety, abnormal bruising/bleeding, major joint swelling, breast masses or abnormal vaginal bleeding.     Physical exam  BP 140/80  Pulse 69  Temp(Src) 98 F (36.7 C) (Oral)  Ht 5' 2.5" (1.588 m)  Wt 94 lb 12 oz (42.978 kg)  BMI 17.04 kg/m2  SpO2 97%  General Appearance: well developed, well nourished in no acute distress HEENT: symmetrical face, PERRLA, good dentition Neck: no JVD, thyromegaly, or adenopathy, trachea midline Chest: symmetric without deformity Cardiac: PMI non-displaced, RRR, normal S1, S2, no gallop or murmur Lung: clear to ausculation and percussion Vascular: all pulses full without bruits  Abdominal: nondistended, nontender, good bowel sounds, no HSM, no bruits Extremities: no cyanosis, clubbing or edema, no sign of DVT, no varicosities  Skin: normal color, no rashes Neuro: alert and oriented x 3, non-focal Pysch: normal affect

## 2014-05-11 NOTE — Assessment & Plan Note (Signed)
TSH a little low but she would prefer to see her holistic doctor. Advised she send me those results as well. The patient indicates understanding of these issues and agrees with the plan.

## 2014-05-11 NOTE — Assessment & Plan Note (Signed)
Asymptomatic. Needs to establish with new cardiologist- will refer to Dr. Rockey Situ.

## 2014-05-11 NOTE — Assessment & Plan Note (Signed)
Repeat bone density scan. Order entered.

## 2014-05-11 NOTE — Addendum Note (Signed)
Addended by: Modena Nunnery on: 05/11/2014 12:48 PM   Modules accepted: Orders

## 2014-05-11 NOTE — Patient Instructions (Addendum)
Great to see you.  Please stop by to see Rosaria Ferries on your way out to set up your bone density scan.  Please make sure you follow up your thyroid.

## 2014-05-11 NOTE — Assessment & Plan Note (Signed)
The patients weight, height, BMI and visual acuity have been recorded in the chart I have made referrals, counseling and provided education to the patient based review of the above and I have provided the pt with a written personalized care plan for preventive services.  Prevnar 13 Influenza vaccine today.

## 2014-05-22 ENCOUNTER — Ambulatory Visit
Admission: RE | Admit: 2014-05-22 | Discharge: 2014-05-22 | Disposition: A | Discharge status: Home / Self Care | Payer: Medicare Other | Source: Ambulatory Visit | Attending: Family Medicine | Admitting: Family Medicine

## 2014-05-22 DIAGNOSIS — M810 Age-related osteoporosis without current pathological fracture: Secondary | ICD-10-CM | POA: Diagnosis not present

## 2014-05-23 DIAGNOSIS — H2510 Age-related nuclear cataract, unspecified eye: Secondary | ICD-10-CM | POA: Diagnosis not present

## 2014-05-26 DIAGNOSIS — M5430 Sciatica, unspecified side: Secondary | ICD-10-CM | POA: Diagnosis not present

## 2014-05-26 DIAGNOSIS — M999 Biomechanical lesion, unspecified: Secondary | ICD-10-CM | POA: Diagnosis not present

## 2014-05-29 DIAGNOSIS — E349 Endocrine disorder, unspecified: Secondary | ICD-10-CM | POA: Diagnosis not present

## 2014-05-29 DIAGNOSIS — R6889 Other general symptoms and signs: Secondary | ICD-10-CM | POA: Diagnosis not present

## 2014-05-29 DIAGNOSIS — R5381 Other malaise: Secondary | ICD-10-CM | POA: Diagnosis not present

## 2014-05-29 DIAGNOSIS — R7989 Other specified abnormal findings of blood chemistry: Secondary | ICD-10-CM | POA: Diagnosis not present

## 2014-05-29 DIAGNOSIS — E039 Hypothyroidism, unspecified: Secondary | ICD-10-CM | POA: Diagnosis not present

## 2014-06-01 ENCOUNTER — Encounter: Payer: Self-pay | Admitting: *Deleted

## 2014-06-05 ENCOUNTER — Ambulatory Visit: Payer: Medicare Other | Admitting: Cardiovascular Disease

## 2014-06-05 ENCOUNTER — Telehealth: Payer: Self-pay | Admitting: *Deleted

## 2014-06-05 DIAGNOSIS — E559 Vitamin D deficiency, unspecified: Secondary | ICD-10-CM | POA: Diagnosis not present

## 2014-06-05 DIAGNOSIS — N951 Menopausal and female climacteric states: Secondary | ICD-10-CM | POA: Diagnosis not present

## 2014-06-05 DIAGNOSIS — E039 Hypothyroidism, unspecified: Secondary | ICD-10-CM | POA: Diagnosis not present

## 2014-06-05 NOTE — Telephone Encounter (Signed)
Most of the available medications are bisphosphonates.  There is a yearly infusion called reclast. Of course their are the oral alternatives like fosamax and boniva but those would be difficult to take due to her severe gastric reflux.  There are also hormone preparations but those come with other risks as well.

## 2014-06-05 NOTE — Telephone Encounter (Signed)
Lm on pts vm requesting a call back 

## 2014-06-05 NOTE — Telephone Encounter (Signed)
Spoke to pt and informed her of results and Dx of osteoporosis. Pt is wanting to know what alternates, outside of prolia, are available. Pt is not wanting to require management "for the rest of her life." She questions if there is a medication that she can take and then possibly have repeat labs to determine if it is working.

## 2014-06-06 ENCOUNTER — Other Ambulatory Visit: Payer: Medicare Other

## 2014-06-06 NOTE — Telephone Encounter (Signed)
Noted.  Please schedule next injection.

## 2014-06-06 NOTE — Telephone Encounter (Signed)
Spoke to pt and advised per Dr Deborra Medina. Pt indicates that she prefers to continue prolia.

## 2014-06-09 DIAGNOSIS — M5137 Other intervertebral disc degeneration, lumbosacral region: Secondary | ICD-10-CM | POA: Diagnosis not present

## 2014-06-09 DIAGNOSIS — M545 Low back pain: Secondary | ICD-10-CM | POA: Diagnosis not present

## 2014-06-09 DIAGNOSIS — M5417 Radiculopathy, lumbosacral region: Secondary | ICD-10-CM | POA: Diagnosis not present

## 2014-06-09 DIAGNOSIS — M9902 Segmental and somatic dysfunction of thoracic region: Secondary | ICD-10-CM | POA: Diagnosis not present

## 2014-06-09 DIAGNOSIS — M9905 Segmental and somatic dysfunction of pelvic region: Secondary | ICD-10-CM | POA: Diagnosis not present

## 2014-06-09 DIAGNOSIS — M9903 Segmental and somatic dysfunction of lumbar region: Secondary | ICD-10-CM | POA: Diagnosis not present

## 2014-06-09 NOTE — Telephone Encounter (Signed)
Spoke to pt and informed her that paperwork has been submitted for prolia and I will contact her to schedule injection upon receiving confirmation. Pt has already had Calcium labs 05/2014

## 2014-06-13 ENCOUNTER — Ambulatory Visit: Payer: Self-pay | Admitting: Ophthalmology

## 2014-06-13 ENCOUNTER — Ambulatory Visit: Payer: Medicare Other | Admitting: Cardiovascular Disease

## 2014-06-13 DIAGNOSIS — H2511 Age-related nuclear cataract, right eye: Secondary | ICD-10-CM | POA: Diagnosis not present

## 2014-06-13 DIAGNOSIS — M810 Age-related osteoporosis without current pathological fracture: Secondary | ICD-10-CM | POA: Diagnosis not present

## 2014-06-13 DIAGNOSIS — K589 Irritable bowel syndrome without diarrhea: Secondary | ICD-10-CM | POA: Diagnosis not present

## 2014-06-13 DIAGNOSIS — Z7982 Long term (current) use of aspirin: Secondary | ICD-10-CM | POA: Diagnosis not present

## 2014-06-13 DIAGNOSIS — Z880 Allergy status to penicillin: Secondary | ICD-10-CM | POA: Diagnosis not present

## 2014-06-13 DIAGNOSIS — Z8673 Personal history of transient ischemic attack (TIA), and cerebral infarction without residual deficits: Secondary | ICD-10-CM | POA: Diagnosis not present

## 2014-06-13 DIAGNOSIS — Z79899 Other long term (current) drug therapy: Secondary | ICD-10-CM | POA: Diagnosis not present

## 2014-06-13 DIAGNOSIS — E780 Pure hypercholesterolemia: Secondary | ICD-10-CM | POA: Diagnosis not present

## 2014-06-13 DIAGNOSIS — H269 Unspecified cataract: Secondary | ICD-10-CM | POA: Diagnosis not present

## 2014-06-14 DIAGNOSIS — L570 Actinic keratosis: Secondary | ICD-10-CM | POA: Diagnosis not present

## 2014-06-14 DIAGNOSIS — X32XXXA Exposure to sunlight, initial encounter: Secondary | ICD-10-CM | POA: Diagnosis not present

## 2014-06-14 DIAGNOSIS — D225 Melanocytic nevi of trunk: Secondary | ICD-10-CM | POA: Diagnosis not present

## 2014-06-14 DIAGNOSIS — L821 Other seborrheic keratosis: Secondary | ICD-10-CM | POA: Diagnosis not present

## 2014-06-14 NOTE — Telephone Encounter (Signed)
Spoke to pt and informed her paperwork has been received and showing that she has $0 deductible. Pt was advised that this is only an estimate and is subject to change. Injection scheduled

## 2014-06-16 DIAGNOSIS — M545 Low back pain: Secondary | ICD-10-CM | POA: Diagnosis not present

## 2014-06-16 DIAGNOSIS — M5417 Radiculopathy, lumbosacral region: Secondary | ICD-10-CM | POA: Diagnosis not present

## 2014-06-16 DIAGNOSIS — M5137 Other intervertebral disc degeneration, lumbosacral region: Secondary | ICD-10-CM | POA: Diagnosis not present

## 2014-06-16 DIAGNOSIS — M9903 Segmental and somatic dysfunction of lumbar region: Secondary | ICD-10-CM | POA: Diagnosis not present

## 2014-06-16 DIAGNOSIS — M9902 Segmental and somatic dysfunction of thoracic region: Secondary | ICD-10-CM | POA: Diagnosis not present

## 2014-06-16 DIAGNOSIS — M9905 Segmental and somatic dysfunction of pelvic region: Secondary | ICD-10-CM | POA: Diagnosis not present

## 2014-06-19 DIAGNOSIS — M5136 Other intervertebral disc degeneration, lumbar region: Secondary | ICD-10-CM | POA: Diagnosis not present

## 2014-06-19 DIAGNOSIS — M25551 Pain in right hip: Secondary | ICD-10-CM | POA: Diagnosis not present

## 2014-06-20 DIAGNOSIS — M9903 Segmental and somatic dysfunction of lumbar region: Secondary | ICD-10-CM | POA: Diagnosis not present

## 2014-06-20 DIAGNOSIS — M5417 Radiculopathy, lumbosacral region: Secondary | ICD-10-CM | POA: Diagnosis not present

## 2014-06-20 DIAGNOSIS — M9905 Segmental and somatic dysfunction of pelvic region: Secondary | ICD-10-CM | POA: Diagnosis not present

## 2014-06-20 DIAGNOSIS — M5137 Other intervertebral disc degeneration, lumbosacral region: Secondary | ICD-10-CM | POA: Diagnosis not present

## 2014-06-20 DIAGNOSIS — M9902 Segmental and somatic dysfunction of thoracic region: Secondary | ICD-10-CM | POA: Diagnosis not present

## 2014-06-20 DIAGNOSIS — M545 Low back pain: Secondary | ICD-10-CM | POA: Diagnosis not present

## 2014-06-27 DIAGNOSIS — M9902 Segmental and somatic dysfunction of thoracic region: Secondary | ICD-10-CM | POA: Diagnosis not present

## 2014-06-27 DIAGNOSIS — M9903 Segmental and somatic dysfunction of lumbar region: Secondary | ICD-10-CM | POA: Diagnosis not present

## 2014-06-27 DIAGNOSIS — M5137 Other intervertebral disc degeneration, lumbosacral region: Secondary | ICD-10-CM | POA: Diagnosis not present

## 2014-06-27 DIAGNOSIS — M9905 Segmental and somatic dysfunction of pelvic region: Secondary | ICD-10-CM | POA: Diagnosis not present

## 2014-06-27 DIAGNOSIS — M545 Low back pain: Secondary | ICD-10-CM | POA: Diagnosis not present

## 2014-06-27 DIAGNOSIS — M5417 Radiculopathy, lumbosacral region: Secondary | ICD-10-CM | POA: Diagnosis not present

## 2014-06-29 ENCOUNTER — Encounter (INDEPENDENT_AMBULATORY_CARE_PROVIDER_SITE_OTHER): Payer: Self-pay

## 2014-06-29 ENCOUNTER — Ambulatory Visit (INDEPENDENT_AMBULATORY_CARE_PROVIDER_SITE_OTHER): Payer: Medicare Other | Admitting: Cardiovascular Disease

## 2014-06-29 ENCOUNTER — Encounter: Payer: Self-pay | Admitting: Cardiovascular Disease

## 2014-06-29 VITALS — BP 120/80 | HR 66 | Ht 62.5 in | Wt 97.0 lb

## 2014-06-29 DIAGNOSIS — I341 Nonrheumatic mitral (valve) prolapse: Secondary | ICD-10-CM

## 2014-06-29 DIAGNOSIS — K900 Celiac disease: Secondary | ICD-10-CM

## 2014-06-29 DIAGNOSIS — E785 Hyperlipidemia, unspecified: Secondary | ICD-10-CM | POA: Diagnosis not present

## 2014-06-29 DIAGNOSIS — R011 Cardiac murmur, unspecified: Secondary | ICD-10-CM

## 2014-06-29 DIAGNOSIS — I059 Rheumatic mitral valve disease, unspecified: Secondary | ICD-10-CM | POA: Diagnosis not present

## 2014-06-29 DIAGNOSIS — I6523 Occlusion and stenosis of bilateral carotid arteries: Secondary | ICD-10-CM | POA: Diagnosis not present

## 2014-06-29 DIAGNOSIS — Z8673 Personal history of transient ischemic attack (TIA), and cerebral infarction without residual deficits: Secondary | ICD-10-CM

## 2014-06-29 DIAGNOSIS — E782 Mixed hyperlipidemia: Secondary | ICD-10-CM

## 2014-06-29 DIAGNOSIS — I6529 Occlusion and stenosis of unspecified carotid artery: Secondary | ICD-10-CM | POA: Insufficient documentation

## 2014-06-29 NOTE — Patient Instructions (Signed)
You are doing well. No medication changes were made.  Please call us if you have new issues that need to be addressed before your next appt.  Your physician wants you to follow-up in: 12 months.  You will receive a reminder letter in the mail two months in advance. If you don't receive a letter, please call our office to schedule the follow-up appointment. 

## 2014-06-29 NOTE — Progress Notes (Addendum)
Patient ID: Deanna Cobb, female    DOB: [birth year], 79 y.o.   MRN: [id number]  HPI Comments: Deanna Cobb is a pleasant 79 year old woman with history of TIA in 2006, workup at that time showing mild atherosclerosis of the carotids bilaterally,  echocardiogram in 2006 suggesting borderline mitral valve prolapse, trace MR, normal ejection fraction who presents to establish care in the [place] office.  In general she reports that she has been doing well. She is active, denies any shortness of breath, leg edema. Recent weight loss after she changed to a gluten-free diet.  With the weight loss, total cholesterol has dropped from 203 down to 180. She was previously on Crestor but stopped this on her own. Uncertain if there were side effects but prior notes indicate she felt  "foggy"  No new symptoms otherwise EKG shows normal sinus rhythm with rate 66 beats a minute, no significant ST or T wave changes   Outpatient Encounter Prescriptions as of 06/29/2014  Medication Sig  . acidophilus (RISAQUAD) CAPS Take 1 capsule by mouth daily.  . Alpha-Lipoic Acid 600 MG CAPS Take by mouth.  Marland Kitchen aspirin 81 MG tablet Take 81 mg by mouth daily.    . cholecalciferol (VITAMIN D) 1000 UNITS tablet Take 1,000 Units by mouth daily.   . Coenzyme Q10 (COQ-10) 100 MG CAPS Take one by mouth daily  . DHEA 10 MG CAPS Take one by mouth daily  . EST ESTROGENS-METHYLTEST DS PO Take 1 capsule by mouth daily.    [name] (INTRINSI B12-FOLATE) [phone] MCG-MCG-MG TABS Take 1 tablet by mouth daily.   . NON FORMULARY Tumeric 300 mg take one tablet daily.  Marland Kitchen thyroid (ARMOUR) 15 MG tablet Take 1 tablet (15 mg total) by mouth daily.  . [DISCONTINUED] clindamycin (CLEOCIN) 150 MG capsule Take 1 capsule (150 mg total) by mouth every 6 (six) hours.   Family History   Problem  Relation  Age of Onset   .  Throat cancer  Mother    .  Alcohol abuse  Brother     History    Social History   .  Marital  Status:  Married     Spouse Name:  N/A     Number of Children:  4   .  Years of Education:  N/A    Occupational History   .  Retired-RN     Social History Main Topics   .  Smoking status:  Never Smoker   .  Smokeless tobacco:  Never Used   .  Alcohol Use:  Yes      Comment: 3-4 glasses of wine a week   .  Drug Use:  No   .  Sexual Activity:  Not on file      Review of Systems  Constitutional: Negative.   HENT: Negative.   Eyes: Negative.   Respiratory: Negative.   Cardiovascular: Negative.   Gastrointestinal: Negative.   Endocrine: Negative.   Musculoskeletal: Negative.   Skin: Negative.   Allergic/Immunologic: Negative.   Neurological: Negative.   Hematological: Negative.   Psychiatric/Behavioral: Negative.   All other systems reviewed and are negative.   BP 120/80  Pulse 66  Ht 5' 2.5" (1.588 m)  Wt 97 lb (43.999 kg)  BMI 17.45 kg/m2  Physical Exam  Nursing note and vitals reviewed. Constitutional: She is oriented to person, place, and time. She appears well-developed and well-nourished.  HENT:  Head: Normocephalic.  Nose: Nose normal.  Mouth/Throat: Oropharynx is clear  and moist.  Eyes: Conjunctivae are normal. Pupils are equal, round, and reactive to light.  Neck: Normal range of motion. Neck supple. No JVD present.  Cardiovascular: Normal rate, regular rhythm, S1 normal, S2 normal, normal heart sounds and intact distal pulses.  Exam reveals no gallop and no friction rub.   No murmur heard. Pulmonary/Chest: Effort normal and breath sounds normal. No respiratory distress. She has no wheezes. She has no rales. She exhibits no tenderness.  Abdominal: Soft. Bowel sounds are normal. She exhibits no distension. There is no tenderness.  Musculoskeletal: Normal range of motion. She exhibits no edema and no tenderness.  Lymphadenopathy:    She has no cervical adenopathy.  Neurological: She is alert and oriented to person, place, and time. Coordination normal.   Skin: Skin is warm and dry. No rash noted. No erythema.  Psychiatric: She has a normal mood and affect. Her behavior is normal. Judgment and thought content normal.    Assessment and Plan

## 2014-06-29 NOTE — Assessment & Plan Note (Signed)
Previously mentioned on MRI in 2006. We did offer routine carotid ultrasound. She did not think this was particularly needed at this time

## 2014-06-29 NOTE — Assessment & Plan Note (Signed)
Recent significant weight loss. Recommended she try to supplement her diet

## 2014-06-29 NOTE — Assessment & Plan Note (Signed)
Prior echocardiogram suggesting borderline mitral valve prolapse with trace MR. Likely not a major clinical issue. No significant murmur appreciated on exam

## 2014-06-29 NOTE — Assessment & Plan Note (Signed)
Long discussion about her cholesterol. Recent decrease in her cholesterol likely from weight loss from new gluten-free diet.  Given her carotid arterial disease, goal total cholesterol should be less than 150. She does not want to restart a statin. She prefers to try red yeast rice.

## 2014-06-29 NOTE — Assessment & Plan Note (Signed)
Prior hospitalization records reviewed including MRA of the neck and echocardiogram. We discussed the findings of mild atherosclerosis in her carotids bilaterally. She will stay on aspirin

## 2014-07-04 DIAGNOSIS — M5417 Radiculopathy, lumbosacral region: Secondary | ICD-10-CM | POA: Diagnosis not present

## 2014-07-04 DIAGNOSIS — M545 Low back pain: Secondary | ICD-10-CM | POA: Diagnosis not present

## 2014-07-04 DIAGNOSIS — M5137 Other intervertebral disc degeneration, lumbosacral region: Secondary | ICD-10-CM | POA: Diagnosis not present

## 2014-07-04 DIAGNOSIS — M9905 Segmental and somatic dysfunction of pelvic region: Secondary | ICD-10-CM | POA: Diagnosis not present

## 2014-07-04 DIAGNOSIS — M9903 Segmental and somatic dysfunction of lumbar region: Secondary | ICD-10-CM | POA: Diagnosis not present

## 2014-07-04 DIAGNOSIS — M9902 Segmental and somatic dysfunction of thoracic region: Secondary | ICD-10-CM | POA: Diagnosis not present

## 2014-07-07 DIAGNOSIS — M5137 Other intervertebral disc degeneration, lumbosacral region: Secondary | ICD-10-CM | POA: Diagnosis not present

## 2014-07-07 DIAGNOSIS — M545 Low back pain: Secondary | ICD-10-CM | POA: Diagnosis not present

## 2014-07-07 DIAGNOSIS — M9903 Segmental and somatic dysfunction of lumbar region: Secondary | ICD-10-CM | POA: Diagnosis not present

## 2014-07-07 DIAGNOSIS — M5417 Radiculopathy, lumbosacral region: Secondary | ICD-10-CM | POA: Diagnosis not present

## 2014-07-07 DIAGNOSIS — M9905 Segmental and somatic dysfunction of pelvic region: Secondary | ICD-10-CM | POA: Diagnosis not present

## 2014-07-07 DIAGNOSIS — M9902 Segmental and somatic dysfunction of thoracic region: Secondary | ICD-10-CM | POA: Diagnosis not present

## 2014-07-10 DIAGNOSIS — M5417 Radiculopathy, lumbosacral region: Secondary | ICD-10-CM | POA: Diagnosis not present

## 2014-07-10 DIAGNOSIS — M5137 Other intervertebral disc degeneration, lumbosacral region: Secondary | ICD-10-CM | POA: Diagnosis not present

## 2014-07-10 DIAGNOSIS — M9902 Segmental and somatic dysfunction of thoracic region: Secondary | ICD-10-CM | POA: Diagnosis not present

## 2014-07-10 DIAGNOSIS — M9905 Segmental and somatic dysfunction of pelvic region: Secondary | ICD-10-CM | POA: Diagnosis not present

## 2014-07-10 DIAGNOSIS — M9903 Segmental and somatic dysfunction of lumbar region: Secondary | ICD-10-CM | POA: Diagnosis not present

## 2014-07-10 DIAGNOSIS — M545 Low back pain: Secondary | ICD-10-CM | POA: Diagnosis not present

## 2014-07-13 DIAGNOSIS — M545 Low back pain: Secondary | ICD-10-CM | POA: Diagnosis not present

## 2014-07-13 DIAGNOSIS — M9905 Segmental and somatic dysfunction of pelvic region: Secondary | ICD-10-CM | POA: Diagnosis not present

## 2014-07-13 DIAGNOSIS — M5137 Other intervertebral disc degeneration, lumbosacral region: Secondary | ICD-10-CM | POA: Diagnosis not present

## 2014-07-13 DIAGNOSIS — M9903 Segmental and somatic dysfunction of lumbar region: Secondary | ICD-10-CM | POA: Diagnosis not present

## 2014-07-13 DIAGNOSIS — M9902 Segmental and somatic dysfunction of thoracic region: Secondary | ICD-10-CM | POA: Diagnosis not present

## 2014-07-13 DIAGNOSIS — M5417 Radiculopathy, lumbosacral region: Secondary | ICD-10-CM | POA: Diagnosis not present

## 2014-07-18 DIAGNOSIS — M9905 Segmental and somatic dysfunction of pelvic region: Secondary | ICD-10-CM | POA: Diagnosis not present

## 2014-07-18 DIAGNOSIS — M545 Low back pain: Secondary | ICD-10-CM | POA: Diagnosis not present

## 2014-07-18 DIAGNOSIS — M9903 Segmental and somatic dysfunction of lumbar region: Secondary | ICD-10-CM | POA: Diagnosis not present

## 2014-07-18 DIAGNOSIS — M5417 Radiculopathy, lumbosacral region: Secondary | ICD-10-CM | POA: Diagnosis not present

## 2014-07-18 DIAGNOSIS — M9902 Segmental and somatic dysfunction of thoracic region: Secondary | ICD-10-CM | POA: Diagnosis not present

## 2014-07-18 DIAGNOSIS — M5137 Other intervertebral disc degeneration, lumbosacral region: Secondary | ICD-10-CM | POA: Diagnosis not present

## 2014-07-24 DIAGNOSIS — M9905 Segmental and somatic dysfunction of pelvic region: Secondary | ICD-10-CM | POA: Diagnosis not present

## 2014-07-24 DIAGNOSIS — M9903 Segmental and somatic dysfunction of lumbar region: Secondary | ICD-10-CM | POA: Diagnosis not present

## 2014-07-24 DIAGNOSIS — M545 Low back pain: Secondary | ICD-10-CM | POA: Diagnosis not present

## 2014-07-24 DIAGNOSIS — M5417 Radiculopathy, lumbosacral region: Secondary | ICD-10-CM | POA: Diagnosis not present

## 2014-07-24 DIAGNOSIS — M5137 Other intervertebral disc degeneration, lumbosacral region: Secondary | ICD-10-CM | POA: Diagnosis not present

## 2014-07-24 DIAGNOSIS — M9902 Segmental and somatic dysfunction of thoracic region: Secondary | ICD-10-CM | POA: Diagnosis not present

## 2014-07-25 ENCOUNTER — Ambulatory Visit: Payer: Medicare Other

## 2014-07-25 NOTE — Telephone Encounter (Signed)
Left v/m for pt to cb. 

## 2014-07-25 NOTE — Telephone Encounter (Signed)
Pt came in today for prolia injection; as soon as pt was in the room pt asked for the bone density report; advised pt per Dr Hulen Shouts note the bone density was consistent with osteoporosis. Pt wanted to know if there was improvement from the previous bone density done in 2012. At pts request pt was told from comparion note on 2015 bone density that there was no significant interval change in bone mineral density of the lumbar spine. Pt said she had taken Prolia for 2 years and before taking shot today wanted note sent to Dr Deborra Medina to see since there was no improvement after taking Prolia for 2 years can Dr Deborra Medina recommend something else that might be more effective. Pt request cb. [facility]. (Pt did not take Prolia injection today.)

## 2014-07-25 NOTE — Telephone Encounter (Signed)
It's a very good question and one that we struggle with often. There are two ways to look at it--yes, no significant improvement but also no deterioration which is good.  We could certainly try Reclast which is an infusion.  Let me know if she wants to try Reclast and I will talk to [facility] about getting this set up.

## 2014-07-26 NOTE — Telephone Encounter (Signed)
Pt called back and notified as instructed; pt said she would think about the reclast and pt already has appt to see Dr Deborra Medina on 07/27/14 and will try to give answer then.

## 2014-07-27 ENCOUNTER — Telehealth: Payer: Self-pay | Admitting: Internal Medicine

## 2014-07-27 ENCOUNTER — Ambulatory Visit (INDEPENDENT_AMBULATORY_CARE_PROVIDER_SITE_OTHER): Payer: Medicare Other | Admitting: Family Medicine

## 2014-07-27 ENCOUNTER — Encounter: Payer: Self-pay | Admitting: Family Medicine

## 2014-07-27 VITALS — BP 122/70 | HR 64 | Temp 97.6°F | Wt 95.5 lb

## 2014-07-27 DIAGNOSIS — I6523 Occlusion and stenosis of bilateral carotid arteries: Secondary | ICD-10-CM | POA: Diagnosis not present

## 2014-07-27 DIAGNOSIS — R197 Diarrhea, unspecified: Secondary | ICD-10-CM | POA: Insufficient documentation

## 2014-07-27 DIAGNOSIS — K589 Irritable bowel syndrome without diarrhea: Secondary | ICD-10-CM

## 2014-07-27 MED ORDER — DICYCLOMINE HCL 10 MG PO CAPS: 10.0000 mg | ORAL_CAPSULE | Freq: Three times a day (TID) | ORAL | Status: DC

## 2014-07-27 NOTE — Telephone Encounter (Signed)
According to Dr Liliane Channel note from colonoscopy 12/2008, no polyps found. Recommended 5 years interval. She was due in April 2015.

## 2014-07-27 NOTE — Addendum Note (Signed)
Addended by: Ellamae Sia on: 07/27/2014 08:57 AM   Modules accepted: Orders

## 2014-07-27 NOTE — Progress Notes (Signed)
Subjective:   Patient ID: Deanna Cobb, female    DOB: 1935-07-16, 79 y.o.   MRN: [id number]  Deanna Cobb is a pleasant 79 y.o. year old female who presents to clinic today with Diarrhea  on 07/27/2014  HPI: H/o IBS and gluten  Intolerance here for worsening diarrhea for past several weeks. This is different than her typical IBS associated diarrhea. Having very lose BM up to 10 times per day, mucous as well- not evacuating much when she does go. Losing more weight. Wt Readings from Last 3 Encounters:  07/27/14 95 lb 8 oz (43.319 kg)  06/29/14 97 lb (43.999 kg)  05/11/14 94 lb 12 oz (42.978 kg)   Did take clindamycin but not since July.  She feels this started after she took [employer]- has since stopped it and symptoms have persisted.  Last colonoscopy 12/18/08- [facility], advised recall in 5 years due to polyps. Last saw Dr. Olevia Perches for IBS. Current Outpatient Prescriptions on File Prior to Visit  Medication Sig Dispense Refill  . acidophilus (RISAQUAD) CAPS Take 1 capsule by mouth daily.    . Alpha-Lipoic Acid 600 MG CAPS Take by mouth.    Marland Kitchen aspirin 81 MG tablet Take 81 mg by mouth daily.      . cholecalciferol (VITAMIN D) 1000 UNITS tablet Take 1,000 Units by mouth daily.     . Coenzyme Q10 (COQ-10) 100 MG CAPS Take one by mouth daily    . DHEA 10 MG CAPS Take one by mouth daily    . EST ESTROGENS-METHYLTEST DS PO Take 1 capsule by mouth daily.      [name] (INTRINSI B12-FOLATE) [phone] MCG-MCG-MG TABS Take 1 tablet by mouth daily.     . NON FORMULARY Tumeric 300 mg take one tablet daily.    Marland Kitchen thyroid (ARMOUR) 15 MG tablet Take 1 tablet (15 mg total) by mouth daily. 30 tablet 3   No current facility-administered medications on file prior to visit.    Allergies  Allergen Reactions  . Penicillins     REACTION: hives    Past Medical History  Diagnosis Date  . Mitral valve prolapse   . Coronary atherosclerosis     D/T lipid rich plaque  .  TIA (transient ischemic attack) 04/2005  . Hypothyroidism   . Sarcoidosis 1966  . Osteoporosis   . Internal hemorrhoids   . Colitis 2012  . Allergy     multiple food allergies  . Asthma     as a child  . GERD (gastroesophageal reflux disease)   . Heart murmur   . Hyperlipidemia   . H. pylori infection 2012  . Accidental fall from chair   . Visit for suture removal   . Insomnia     Past Surgical History  Procedure Laterality Date  . Abdominal hysterectomy  1972  . Tonsillectomy    . Lymph node biopsy  1966    Family History  Problem Relation Age of Onset  . Throat cancer Mother   . Alcohol abuse Brother     History   Social History  . Marital Status: Married    Spouse Name: N/A    Number of Children: 4  . Years of Education: N/A   Occupational History  . Retired-RN    Social History Main Topics  . Smoking status: Never Smoker   . Smokeless tobacco: Never Used  . Alcohol Use: Yes     Comment: 3-4 glasses of wine a week  .  Drug Use: No  . Sexual Activity: Not on file   Other Topics Concern  . Not on file   Social History Narrative   Does have a living will.   Would desire CPR but would not want prolonged life support.   The PMH, PSH, Social History, Family History, Medications, and allergies have been reviewed in [place], and have been updated if relevant.  Review of Systems    See HPI No fevers No nausea or vomiting No abdominal pain  Objective:    BP 122/70 mmHg  Pulse 64  Temp(Src) 97.6 F (36.4 C) (Oral)  Wt 95 lb 8 oz (43.319 kg)  SpO2 98%   Physical Exam  Constitutional: She is oriented to person, place, and time.  Thin, NAD  Cardiovascular: Normal rate.   Pulmonary/Chest: Effort normal.  Abdominal: Soft. Bowel sounds are normal. She exhibits no distension and no mass. There is no tenderness. There is no rebound and no guarding.  Neurological: She is alert and oriented to person, place, and time.  Skin: Skin is warm and dry.    Psychiatric: She has a normal mood and affect. Her behavior is normal. Judgment and thought content normal.  Nursing note and vitals reviewed.         Assessment & Plan:   Diarrhea - Plan: C difficile Toxins A+B W/Rflx, Ambulatory referral to Gastroenterology  IBS (irritable bowel syndrome) - Plan: C difficile Toxins A+B W/Rflx, Ambulatory referral to Gastroenterology No Follow-up on file.

## 2014-07-27 NOTE — Progress Notes (Signed)
Pre visit review using our clinic review tool, if applicable. No additional management support is needed unless otherwise documented below in the visit note. 

## 2014-07-27 NOTE — Telephone Encounter (Signed)
Dr Brodie, please advise. 

## 2014-07-27 NOTE — Patient Instructions (Signed)
Good to see you. Please stop by to see Deanna Cobb on your way out to set up your GI referral.  Stop by the lab.  Please update me next week.

## 2014-07-27 NOTE — Assessment & Plan Note (Signed)
Deteriorated. Refer for colonoscopy (due anyway)- Dr. Olevia Perches. Get stool sample- she would like to rule out C diff although I feel this is less likely. Discussed probiotics- she is already taking one- continue this. Start very low dose Bentyl- advised caution- and not to use for more than a week. The patient indicates understanding of these issues and agrees with the plan.

## 2014-07-28 DIAGNOSIS — R197 Diarrhea, unspecified: Secondary | ICD-10-CM | POA: Diagnosis not present

## 2014-07-28 DIAGNOSIS — K589 Irritable bowel syndrome without diarrhea: Secondary | ICD-10-CM | POA: Diagnosis not present

## 2014-07-28 NOTE — Telephone Encounter (Signed)
Patient advised of Dr Nichola Sizer response. She has scheduled a colonoscopy on 09/27/14.

## 2014-07-28 NOTE — Addendum Note (Signed)
Addended by: Ellamae Sia on: 07/28/2014 11:24 AM   Modules accepted: Orders

## 2014-07-29 LAB — C. DIFFICILE GDH AND TOXIN A/B
C. DIFF TOXIN A/B: NOT DETECTED
C. difficile GDH: DETECTED — AB

## 2014-07-29 LAB — CLOSTRIDIUM DIFFICILE BY PCR: Toxigenic C. Difficile by PCR: DETECTED — CR

## 2014-07-31 ENCOUNTER — Telehealth: Payer: Self-pay

## 2014-07-31 ENCOUNTER — Other Ambulatory Visit: Payer: Self-pay | Admitting: Family Medicine

## 2014-07-31 DIAGNOSIS — M545 Low back pain: Secondary | ICD-10-CM | POA: Diagnosis not present

## 2014-07-31 DIAGNOSIS — M5417 Radiculopathy, lumbosacral region: Secondary | ICD-10-CM | POA: Diagnosis not present

## 2014-07-31 DIAGNOSIS — M9905 Segmental and somatic dysfunction of pelvic region: Secondary | ICD-10-CM | POA: Diagnosis not present

## 2014-07-31 DIAGNOSIS — M5137 Other intervertebral disc degeneration, lumbosacral region: Secondary | ICD-10-CM | POA: Diagnosis not present

## 2014-07-31 DIAGNOSIS — M9902 Segmental and somatic dysfunction of thoracic region: Secondary | ICD-10-CM | POA: Diagnosis not present

## 2014-07-31 DIAGNOSIS — M9903 Segmental and somatic dysfunction of lumbar region: Secondary | ICD-10-CM | POA: Diagnosis not present

## 2014-07-31 MED ORDER — METRONIDAZOLE 500 MG PO TABS: 500.0000 mg | ORAL_TABLET | Freq: Three times a day (TID) | ORAL | Status: AC

## 2014-07-31 NOTE — Telephone Encounter (Signed)
Already spoke with pt

## 2014-07-31 NOTE — Telephone Encounter (Signed)
[facility] (After Hours Triage) Fax: [phone] From: [facility] Date/ Time: 07/31/2014 7:40 AM Taken By: Ardeen Garland, CSR Caller: [place]: home Patient: Deanna Cobb, Deanna Cobb DOB: 1935/02/08 Phone: [phone] Reason for Call: Patient is calling regarding lab work results and she was supposed to call Dr Deborra Medina this morning before office hours. Please call patient back ASAP. Thanks Regarding Appointment:

## 2014-08-08 ENCOUNTER — Encounter: Payer: Self-pay | Admitting: Internal Medicine

## 2014-08-08 DIAGNOSIS — M5417 Radiculopathy, lumbosacral region: Secondary | ICD-10-CM | POA: Diagnosis not present

## 2014-08-08 DIAGNOSIS — M545 Low back pain: Secondary | ICD-10-CM | POA: Diagnosis not present

## 2014-08-08 DIAGNOSIS — M9903 Segmental and somatic dysfunction of lumbar region: Secondary | ICD-10-CM | POA: Diagnosis not present

## 2014-08-08 DIAGNOSIS — M5137 Other intervertebral disc degeneration, lumbosacral region: Secondary | ICD-10-CM | POA: Diagnosis not present

## 2014-08-08 DIAGNOSIS — M9902 Segmental and somatic dysfunction of thoracic region: Secondary | ICD-10-CM | POA: Diagnosis not present

## 2014-08-08 DIAGNOSIS — M9905 Segmental and somatic dysfunction of pelvic region: Secondary | ICD-10-CM | POA: Diagnosis not present

## 2014-08-14 ENCOUNTER — Telehealth: Payer: Self-pay

## 2014-08-14 NOTE — Telephone Encounter (Signed)
Pt left v/m; pt has finished Flagyl and pt request repeat stool specimen to be sure c diff is gone. Pt request cb.

## 2014-08-15 ENCOUNTER — Other Ambulatory Visit: Payer: Self-pay | Admitting: Family Medicine

## 2014-08-15 DIAGNOSIS — R197 Diarrhea, unspecified: Secondary | ICD-10-CM

## 2014-08-15 NOTE — Telephone Encounter (Signed)
Spoke to pt and advised per Dr Deborra Medina; pt verbally expressed understanding and states that she will pickup re-test

## 2014-08-15 NOTE — Telephone Encounter (Signed)
Yes-ok to recheck

## 2014-08-16 ENCOUNTER — Other Ambulatory Visit: Payer: Medicare Other

## 2014-08-16 DIAGNOSIS — R197 Diarrhea, unspecified: Secondary | ICD-10-CM

## 2014-08-17 LAB — C. DIFFICILE GDH AND TOXIN A/B
C. DIFF TOXIN A/B: NOT DETECTED
C. difficile GDH: NOT DETECTED

## 2014-08-18 DIAGNOSIS — M9905 Segmental and somatic dysfunction of pelvic region: Secondary | ICD-10-CM | POA: Diagnosis not present

## 2014-08-18 DIAGNOSIS — M545 Low back pain: Secondary | ICD-10-CM | POA: Diagnosis not present

## 2014-08-18 DIAGNOSIS — M5417 Radiculopathy, lumbosacral region: Secondary | ICD-10-CM | POA: Diagnosis not present

## 2014-08-18 DIAGNOSIS — M5137 Other intervertebral disc degeneration, lumbosacral region: Secondary | ICD-10-CM | POA: Diagnosis not present

## 2014-08-18 DIAGNOSIS — M9902 Segmental and somatic dysfunction of thoracic region: Secondary | ICD-10-CM | POA: Diagnosis not present

## 2014-08-18 DIAGNOSIS — M9903 Segmental and somatic dysfunction of lumbar region: Secondary | ICD-10-CM | POA: Diagnosis not present

## 2014-08-25 DIAGNOSIS — M5137 Other intervertebral disc degeneration, lumbosacral region: Secondary | ICD-10-CM | POA: Diagnosis not present

## 2014-08-25 DIAGNOSIS — M5417 Radiculopathy, lumbosacral region: Secondary | ICD-10-CM | POA: Diagnosis not present

## 2014-08-25 DIAGNOSIS — M545 Low back pain: Secondary | ICD-10-CM | POA: Diagnosis not present

## 2014-08-25 DIAGNOSIS — M9905 Segmental and somatic dysfunction of pelvic region: Secondary | ICD-10-CM | POA: Diagnosis not present

## 2014-08-25 DIAGNOSIS — M9903 Segmental and somatic dysfunction of lumbar region: Secondary | ICD-10-CM | POA: Diagnosis not present

## 2014-08-25 DIAGNOSIS — M9902 Segmental and somatic dysfunction of thoracic region: Secondary | ICD-10-CM | POA: Diagnosis not present

## 2014-09-12 DIAGNOSIS — M9903 Segmental and somatic dysfunction of lumbar region: Secondary | ICD-10-CM | POA: Diagnosis not present

## 2014-09-12 DIAGNOSIS — M5417 Radiculopathy, lumbosacral region: Secondary | ICD-10-CM | POA: Diagnosis not present

## 2014-09-12 DIAGNOSIS — M545 Low back pain: Secondary | ICD-10-CM | POA: Diagnosis not present

## 2014-09-12 DIAGNOSIS — M9905 Segmental and somatic dysfunction of pelvic region: Secondary | ICD-10-CM | POA: Diagnosis not present

## 2014-09-12 DIAGNOSIS — M5137 Other intervertebral disc degeneration, lumbosacral region: Secondary | ICD-10-CM | POA: Diagnosis not present

## 2014-09-12 DIAGNOSIS — M9902 Segmental and somatic dysfunction of thoracic region: Secondary | ICD-10-CM | POA: Diagnosis not present

## 2014-09-20 ENCOUNTER — Telehealth: Payer: Self-pay | Admitting: Family Medicine

## 2014-09-20 ENCOUNTER — Ambulatory Visit (INDEPENDENT_AMBULATORY_CARE_PROVIDER_SITE_OTHER): Payer: Medicare Other | Admitting: Family Medicine

## 2014-09-20 ENCOUNTER — Encounter: Payer: Self-pay | Admitting: Family Medicine

## 2014-09-20 VITALS — BP 96/54 | HR 67 | Temp 97.5°F | Wt 95.5 lb

## 2014-09-20 DIAGNOSIS — T8189XS Other complications of procedures, not elsewhere classified, sequela: Secondary | ICD-10-CM

## 2014-09-20 DIAGNOSIS — T8189XA Other complications of procedures, not elsewhere classified, initial encounter: Secondary | ICD-10-CM | POA: Insufficient documentation

## 2014-09-20 MED ORDER — RED YEAST RICE 600 MG PO CAPS: ORAL_CAPSULE | ORAL | Status: DC

## 2014-09-20 NOTE — Progress Notes (Signed)
Subjective:   Patient ID: Deanna Cobb, female    DOB: 05-08-1935, 80 y.o.   MRN: [id number]  Deanna Cobb is a pleasant 80 y.o. year old female who presents to clinic today with Animal Bite  on 09/20/2014  HPI:  Irritated leg wound- in 04/2014, lacerated her left leg requiring four sutures.  Had healed well but there was one open area at base of wound that was left open - per pt, "to drain."  It seemed to be healing but over past 2 months, now painful to touch at times, sometimes feels there is "pus underneath it."  Pain is more frequent. No fevers or chills.  She does have a C diff s/p abx.  Current Outpatient Prescriptions on File Prior to Visit  Medication Sig Dispense Refill  . acidophilus (RISAQUAD) CAPS Take 1 capsule by mouth daily.    . Alpha-Lipoic Acid 600 MG CAPS Take by mouth.    Marland Kitchen aspirin 81 MG tablet Take 81 mg by mouth daily.      . cholecalciferol (VITAMIN D) 1000 UNITS tablet Take 1,000 Units by mouth daily.     . Coenzyme Q10 (COQ-10) 100 MG CAPS Take one by mouth daily    . DHEA 10 MG CAPS Take one by mouth daily    . EST ESTROGENS-METHYLTEST DS PO Take 1 capsule by mouth daily.      [name] (INTRINSI B12-FOLATE) 102-585-27 MCG-MCG-MG TABS Take 1 tablet by mouth daily.     . NON FORMULARY Tumeric 300 mg take one tablet daily.    Marland Kitchen thyroid (ARMOUR) 15 MG tablet Take 1 tablet (15 mg total) by mouth daily. 30 tablet 3   No current facility-administered medications on file prior to visit.    Allergies  Allergen Reactions  . Penicillins     REACTION: hives    Past Medical History  Diagnosis Date  . Mitral valve prolapse   . Coronary atherosclerosis     D/T lipid rich plaque  . TIA (transient ischemic attack) 04/2005  . Hypothyroidism   . Sarcoidosis 1966  . Osteoporosis   . Internal hemorrhoids   . Colitis 2012  . Allergy     multiple food allergies  . Asthma     as a child  . GERD (gastroesophageal reflux disease)   . Heart  murmur   . Hyperlipidemia   . H. pylori infection 2012  . Accidental fall from chair   . Visit for suture removal   . Insomnia     Past Surgical History  Procedure Laterality Date  . Abdominal hysterectomy  1972  . Tonsillectomy    . Lymph node biopsy  1966    Family History  Problem Relation Age of Onset  . Throat cancer Mother   . Alcohol abuse Brother     History   Social History  . Marital Status: Married    Spouse Name: N/A    Number of Children: 4  . Years of Education: N/A   Occupational History  . Retired-RN    Social History Main Topics  . Smoking status: Never Smoker   . Smokeless tobacco: Never Used  . Alcohol Use: Yes     Comment: 3-4 glasses of wine a week  . Drug Use: No  . Sexual Activity: Not on file   Other Topics Concern  . Not on file   Social History Narrative   Does have a living will.   Would desire CPR but would  not want prolonged life support.   The PMH, PSH, Social History, Family History, Medications, and allergies have been reviewed in [facility], and have been updated if relevant.   Review of Systems  Constitutional: Negative.   HENT: Negative.   Respiratory: Negative.   Cardiovascular: Negative.   Neurological: Negative.   Hematological: Negative.   Psychiatric/Behavioral: Negative.   All other systems reviewed and are negative.      Objective:    BP 96/54 mmHg  Pulse 67  Temp(Src) 97.5 F (36.4 C) (Oral)  Wt 95 lb 8 oz (43.319 kg)  SpO2 99%   Physical Exam  Constitutional: She is oriented to person, place, and time. She appears well-developed and well-nourished. No distress.  HENT:  Head: Normocephalic.  Eyes: Conjunctivae are normal.  Neck: Normal range of motion.  Cardiovascular: Normal rate.   Pulmonary/Chest: Effort normal.  Musculoskeletal: Normal range of motion.       Legs: Neurological: She is alert and oriented to person, place, and time. No cranial nerve deficit.  Skin: Skin is warm.  Psychiatric: She  has a normal mood and affect. Her behavior is normal. Judgment and thought content normal. Cognition and memory are normal.          Assessment & Plan:   Incisional irritation, sequela - Plan: Ambulatory referral to General Surgery No Follow-up on file.

## 2014-09-20 NOTE — Patient Instructions (Signed)
Good to see you. Happy New year and birthday! We will call you soon. Call [place] if you have not heard from [place] in 2-3 weeks.

## 2014-09-20 NOTE — Assessment & Plan Note (Signed)
Progressive. Discussed tx options- I suspect this is getting chronically inflamed or maybe even re infected. We agreed not to start abx as this time as no evidence of infection currently and given her h/o C diff. Refer to gen surgery to see if we can excise this area. The patient indicates understanding of these issues and agrees with the plan. Referral placed.

## 2014-09-20 NOTE — Telephone Encounter (Signed)
Thank you :)

## 2014-09-20 NOTE — Telephone Encounter (Signed)
I have electronically sent pt's info for [place] verification and will let you know once I have a response. Thank you.

## 2014-09-20 NOTE — Progress Notes (Signed)
Pre visit review using our clinic review tool, if applicable. No additional management support is needed unless otherwise documented below in the visit note. 

## 2014-09-25 ENCOUNTER — Ambulatory Visit: Payer: Medicare Other | Admitting: Family Medicine

## 2014-09-26 DIAGNOSIS — M9905 Segmental and somatic dysfunction of pelvic region: Secondary | ICD-10-CM | POA: Diagnosis not present

## 2014-09-26 DIAGNOSIS — M5417 Radiculopathy, lumbosacral region: Secondary | ICD-10-CM | POA: Diagnosis not present

## 2014-09-26 DIAGNOSIS — M9903 Segmental and somatic dysfunction of lumbar region: Secondary | ICD-10-CM | POA: Diagnosis not present

## 2014-09-26 DIAGNOSIS — M9902 Segmental and somatic dysfunction of thoracic region: Secondary | ICD-10-CM | POA: Diagnosis not present

## 2014-09-26 DIAGNOSIS — M5137 Other intervertebral disc degeneration, lumbosacral region: Secondary | ICD-10-CM | POA: Diagnosis not present

## 2014-09-26 DIAGNOSIS — M545 Low back pain: Secondary | ICD-10-CM | POA: Diagnosis not present

## 2014-09-27 ENCOUNTER — Encounter: Payer: Medicare Other | Admitting: Internal Medicine

## 2014-09-28 NOTE — Telephone Encounter (Signed)
Lm on pts vm requesting a call back 

## 2014-09-28 NOTE — Telephone Encounter (Signed)
I have rec'd pt's [employer] insurance verification.  Pt has an estimated responsibility of $0 plus $166 deductible.  Please make pt aware this is an estimate and we will not know an exact amt until both of her insurances have paid.  I have sent a copy of the summary of benefits to be scanned into her chart.  Please make pt aware that if she can't afford the $166, she can call [employer] at [phone] to see if she qualifies for one of their assistance programs.  If she qualifies they will instruct her how to proceed.  If you have any questions, please let me know. Thank you.

## 2014-09-28 NOTE — Telephone Encounter (Signed)
Please call pt with information below

## 2014-10-03 NOTE — Telephone Encounter (Signed)
Patient notified as instructed by telephone and verbalized understanding. Patient stated that she can go ahead and proceed with this and wants to make sure that Prolia has been ordered for her.

## 2014-10-04 ENCOUNTER — Other Ambulatory Visit (INDEPENDENT_AMBULATORY_CARE_PROVIDER_SITE_OTHER): Payer: Medicare Other

## 2014-10-04 DIAGNOSIS — M810 Age-related osteoporosis without current pathological fracture: Secondary | ICD-10-CM

## 2014-10-04 LAB — CALCIUM: CALCIUM: 9.6 mg/dL (ref 8.4–10.5)

## 2014-10-04 NOTE — Telephone Encounter (Signed)
Spoke to pt and scheduled Calcium lab. Pt advised injection ordered and once labs are resulted, if normal, prolia appt will be scheduled.

## 2014-10-06 ENCOUNTER — Telehealth: Payer: Self-pay | Admitting: Family Medicine

## 2014-10-06 DIAGNOSIS — T8189XS Other complications of procedures, not elsewhere classified, sequela: Secondary | ICD-10-CM

## 2014-10-06 NOTE — Telephone Encounter (Signed)
Will place referral to wound care.

## 2014-10-06 NOTE — Telephone Encounter (Signed)
[facility]. They recommended patient see [place]. Since CCS did not put stitches in and [name] is not healing properly, patient would need to return to place she received stitches or wound care. Please advise on how to move forward. Thanks!

## 2014-10-11 NOTE — Telephone Encounter (Signed)
Lm on pts vm advising her to contact office to schedule prolia injection

## 2014-10-11 NOTE — Telephone Encounter (Signed)
Calcium within normal limits.  Ok to schedule.

## 2014-10-16 NOTE — Telephone Encounter (Signed)
Pt scheduled nurse visit 10/19/14 at 3 pm (1st available that fit pts schedule) for prolia injection.

## 2014-10-17 DIAGNOSIS — M9903 Segmental and somatic dysfunction of lumbar region: Secondary | ICD-10-CM | POA: Diagnosis not present

## 2014-10-17 DIAGNOSIS — M5417 Radiculopathy, lumbosacral region: Secondary | ICD-10-CM | POA: Diagnosis not present

## 2014-10-17 DIAGNOSIS — M545 Low back pain: Secondary | ICD-10-CM | POA: Diagnosis not present

## 2014-10-17 DIAGNOSIS — M9905 Segmental and somatic dysfunction of pelvic region: Secondary | ICD-10-CM | POA: Diagnosis not present

## 2014-10-17 DIAGNOSIS — M5137 Other intervertebral disc degeneration, lumbosacral region: Secondary | ICD-10-CM | POA: Diagnosis not present

## 2014-10-17 DIAGNOSIS — M9902 Segmental and somatic dysfunction of thoracic region: Secondary | ICD-10-CM | POA: Diagnosis not present

## 2014-10-18 DIAGNOSIS — S81802A Unspecified open wound, left lower leg, initial encounter: Secondary | ICD-10-CM | POA: Diagnosis not present

## 2014-10-19 ENCOUNTER — Ambulatory Visit (INDEPENDENT_AMBULATORY_CARE_PROVIDER_SITE_OTHER): Payer: Medicare Other | Admitting: *Deleted

## 2014-10-19 DIAGNOSIS — M810 Age-related osteoporosis without current pathological fracture: Secondary | ICD-10-CM | POA: Diagnosis not present

## 2014-10-19 MED ORDER — DENOSUMAB 60 MG/ML ~~LOC~~ SOLN
60.0000 mg | Freq: Once | SUBCUTANEOUS | Status: AC
  Administered 2014-10-19: 60 mg via SUBCUTANEOUS

## 2014-11-07 DIAGNOSIS — M545 Low back pain: Secondary | ICD-10-CM | POA: Diagnosis not present

## 2014-11-07 DIAGNOSIS — M9902 Segmental and somatic dysfunction of thoracic region: Secondary | ICD-10-CM | POA: Diagnosis not present

## 2014-11-07 DIAGNOSIS — M9903 Segmental and somatic dysfunction of lumbar region: Secondary | ICD-10-CM | POA: Diagnosis not present

## 2014-11-07 DIAGNOSIS — M9905 Segmental and somatic dysfunction of pelvic region: Secondary | ICD-10-CM | POA: Diagnosis not present

## 2014-11-07 DIAGNOSIS — M5137 Other intervertebral disc degeneration, lumbosacral region: Secondary | ICD-10-CM | POA: Diagnosis not present

## 2014-11-07 DIAGNOSIS — M5417 Radiculopathy, lumbosacral region: Secondary | ICD-10-CM | POA: Diagnosis not present

## 2014-11-13 DIAGNOSIS — H0013 Chalazion right eye, unspecified eyelid: Secondary | ICD-10-CM | POA: Diagnosis not present

## 2014-11-17 DIAGNOSIS — M791 Myalgia: Secondary | ICD-10-CM | POA: Diagnosis not present

## 2014-11-17 DIAGNOSIS — Z043 Encounter for examination and observation following other accident: Secondary | ICD-10-CM | POA: Diagnosis not present

## 2014-11-30 DIAGNOSIS — C44729 Squamous cell carcinoma of skin of left lower limb, including hip: Secondary | ICD-10-CM | POA: Diagnosis not present

## 2014-11-30 DIAGNOSIS — L089 Local infection of the skin and subcutaneous tissue, unspecified: Secondary | ICD-10-CM | POA: Diagnosis not present

## 2014-12-26 DIAGNOSIS — M5417 Radiculopathy, lumbosacral region: Secondary | ICD-10-CM | POA: Diagnosis not present

## 2014-12-26 DIAGNOSIS — M9905 Segmental and somatic dysfunction of pelvic region: Secondary | ICD-10-CM | POA: Diagnosis not present

## 2014-12-26 DIAGNOSIS — M545 Low back pain: Secondary | ICD-10-CM | POA: Diagnosis not present

## 2014-12-26 DIAGNOSIS — M5137 Other intervertebral disc degeneration, lumbosacral region: Secondary | ICD-10-CM | POA: Diagnosis not present

## 2014-12-26 DIAGNOSIS — M9903 Segmental and somatic dysfunction of lumbar region: Secondary | ICD-10-CM | POA: Diagnosis not present

## 2014-12-26 DIAGNOSIS — M9902 Segmental and somatic dysfunction of thoracic region: Secondary | ICD-10-CM | POA: Diagnosis not present

## 2014-12-28 DIAGNOSIS — C44729 Squamous cell carcinoma of skin of left lower limb, including hip: Secondary | ICD-10-CM | POA: Diagnosis not present

## 2014-12-30 NOTE — Op Note (Signed)
PATIENT NAME:  Deanna Cobb, Deanna Cobb MR#:  [id number] DATE OF BIRTH:  05/01/1935  DATE OF PROCEDURE:  01/03/2014  PREOPERATIVE DIAGNOSIS: Visually significant cataract of the left eye.   POSTOPERATIVE DIAGNOSIS: Visually significant cataract of the left eye.   OPERATIVE PROCEDURE: Cataract extraction by phacoemulsification with implant of intraocular lens to left eye.   SURGEON: Birder Robson, MD.   ANESTHESIA:  1.  Managed anesthesia care.  2.  Topical tetracaine drops followed by 2% Xylocaine jelly applied in the preoperative holding area.   COMPLICATIONS: None.   TECHNIQUE:  Stop and chop.  DESCRIPTION OF PROCEDURE: The patient was examined and consented in the preoperative holding area where the aforementioned topical anesthesia was applied to the left eye and then brought back to the Operating Room where the left eye was prepped and draped in the usual sterile ophthalmic fashion and a lid speculum was placed. A paracentesis was created with the side port blade and the anterior chamber was filled with viscoelastic. A near clear corneal incision was performed with the steel keratome. A continuous curvilinear capsulorrhexis was performed with a cystotome followed by the capsulorrhexis forceps. Hydrodissection and hydrodelineation were carried out with BSS on a blunt cannula. The lens was removed in a stop and chop  technique and the remaining cortical material was removed with the irrigation-aspiration handpiece. The capsular bag was inflated with viscoelastic and the Tecnis ZCB00 22.5-diopter lens, serial number [id number] was placed in the capsular bag without complication. The remaining viscoelastic was removed from the eye with the irrigation-aspiration handpiece. The wounds were hydrated. The anterior chamber was flushed with Miostat and the eye was inflated to physiologic pressure. 0.1 mL of cefuroxime concentration 10 mg/mL was placed in the anterior chamber. The wounds were found to be water  tight. The eye was dressed with Vigamox. The patient was given protective glasses to wear throughout the day and a shield with which to sleep tonight. The patient was also given drops with which to begin a drop regimen today and will follow-up with me in one day.      ____________________________ Livingston Diones. Brilyn Tuller, MD wlp:dmm D: 01/03/2014 21:26:40 ET T: 01/03/2014 22:07:45 ET JOB#: [id number]  cc: Danie Diehl L. Chauntay Paszkiewicz, MD, <Dictator> Livingston Diones Cortez Steelman MD ELECTRONICALLY SIGNED 01/04/2014 14:07

## 2014-12-30 NOTE — Op Note (Signed)
PATIENT NAME:  Deanna Cobb, Deanna Cobb MR#:  [id number] DATE OF BIRTH:  Jan 26, 1935  DATE OF PROCEDURE:  06/13/2014  PREOPERATIVE DIAGNOSIS: Visually significant cataract of the right eye.   POSTOPERATIVE DIAGNOSIS: Visually significant cataract of the right eye.   OPERATIVE PROCEDURE: Cataract extraction by phacoemulsification with implant of intraocular lens to right eye.   SURGEON: Birder Robson, MD.   ANESTHESIA:  1. Managed anesthesia care.  2. Topical tetracaine drops followed by 2% Xylocaine jelly applied in the preoperative holding area.   COMPLICATIONS: None.   TECHNIQUE:  Stop and chop.   DESCRIPTION OF PROCEDURE: The patient was examined and consented in the preoperative holding area where the aforementioned topical anesthesia was applied to the right eye and then brought back to the Operating Room where the right eye was prepped and draped in the usual sterile ophthalmic fashion and a lid speculum was placed. A paracentesis was created with the side port blade and the anterior chamber was filled with viscoelastic. A near clear corneal incision was performed with the steel keratome. A continuous curvilinear capsulorrhexis was performed with a cystotome followed by the capsulorrhexis forceps. Hydrodissection and hydrodelineation were carried out with BSS on a blunt cannula. The lens was removed in a stop and chop technique and the remaining cortical material was removed with the irrigation-aspiration handpiece. The capsular bag was inflated with viscoelastic and the Tecnis ZCB00 23.5-diopter lens, serial number [id number] was placed in the capsular bag without complication. The remaining viscoelastic was removed from the eye with the irrigation-aspiration handpiece. The wounds were hydrated. The anterior chamber was flushed with Miostat and the eye was inflated to physiologic pressure. 0.1 mL of cefuroxime concentration 10 mg/mL was placed in the anterior chamber. The wounds were found to be  water tight. The eye was dressed with Vigamox. The patient was given protective glasses to wear throughout the day and a shield with which to sleep tonight. The patient was also given drops with which to begin a drop regimen today and will follow-up with me in one day.    ____________________________ Livingston Diones. Rikia Sukhu, MD wlp:lt D: 06/13/2014 19:26:02 ET T: 06/13/2014 20:26:28 ET JOB#: [id number]  cc: Tory Septer L. Daiel Strohecker, MD, <Dictator> Livingston Diones Jaydy Fitzhenry MD ELECTRONICALLY SIGNED 06/14/2014 12:13

## 2015-01-12 DIAGNOSIS — M791 Myalgia: Secondary | ICD-10-CM | POA: Diagnosis not present

## 2015-01-12 DIAGNOSIS — M9903 Segmental and somatic dysfunction of lumbar region: Secondary | ICD-10-CM | POA: Diagnosis not present

## 2015-01-12 DIAGNOSIS — M4715 Other spondylosis with myelopathy, thoracolumbar region: Secondary | ICD-10-CM | POA: Diagnosis not present

## 2015-01-12 DIAGNOSIS — M4716 Other spondylosis with myelopathy, lumbar region: Secondary | ICD-10-CM | POA: Diagnosis not present

## 2015-01-12 DIAGNOSIS — M5431 Sciatica, right side: Secondary | ICD-10-CM | POA: Diagnosis not present

## 2015-01-12 DIAGNOSIS — M9902 Segmental and somatic dysfunction of thoracic region: Secondary | ICD-10-CM | POA: Diagnosis not present

## 2015-01-12 DIAGNOSIS — M9905 Segmental and somatic dysfunction of pelvic region: Secondary | ICD-10-CM | POA: Diagnosis not present

## 2015-01-17 DIAGNOSIS — M9902 Segmental and somatic dysfunction of thoracic region: Secondary | ICD-10-CM | POA: Diagnosis not present

## 2015-01-17 DIAGNOSIS — M4716 Other spondylosis with myelopathy, lumbar region: Secondary | ICD-10-CM | POA: Diagnosis not present

## 2015-01-17 DIAGNOSIS — M4715 Other spondylosis with myelopathy, thoracolumbar region: Secondary | ICD-10-CM | POA: Diagnosis not present

## 2015-01-17 DIAGNOSIS — M9905 Segmental and somatic dysfunction of pelvic region: Secondary | ICD-10-CM | POA: Diagnosis not present

## 2015-01-17 DIAGNOSIS — M9903 Segmental and somatic dysfunction of lumbar region: Secondary | ICD-10-CM | POA: Diagnosis not present

## 2015-01-17 DIAGNOSIS — M5431 Sciatica, right side: Secondary | ICD-10-CM | POA: Diagnosis not present

## 2015-01-18 DIAGNOSIS — T814XXA Infection following a procedure, initial encounter: Secondary | ICD-10-CM | POA: Diagnosis not present

## 2015-01-23 DIAGNOSIS — M5431 Sciatica, right side: Secondary | ICD-10-CM | POA: Diagnosis not present

## 2015-01-23 DIAGNOSIS — M9905 Segmental and somatic dysfunction of pelvic region: Secondary | ICD-10-CM | POA: Diagnosis not present

## 2015-01-23 DIAGNOSIS — M4716 Other spondylosis with myelopathy, lumbar region: Secondary | ICD-10-CM | POA: Diagnosis not present

## 2015-01-23 DIAGNOSIS — M9902 Segmental and somatic dysfunction of thoracic region: Secondary | ICD-10-CM | POA: Diagnosis not present

## 2015-01-23 DIAGNOSIS — M9903 Segmental and somatic dysfunction of lumbar region: Secondary | ICD-10-CM | POA: Diagnosis not present

## 2015-01-23 DIAGNOSIS — M4715 Other spondylosis with myelopathy, thoracolumbar region: Secondary | ICD-10-CM | POA: Diagnosis not present

## 2015-01-24 DIAGNOSIS — Z961 Presence of intraocular lens: Secondary | ICD-10-CM | POA: Diagnosis not present

## 2015-02-22 ENCOUNTER — Telehealth: Payer: Self-pay

## 2015-02-22 NOTE — Telephone Encounter (Signed)
Pt left v/m requesting prolia to be approved by ins co and med ordered. Pt last received prolia on 10/19/14 and has appt on 04/20/15 for nurse visit for prolia.Please advise.

## 2015-02-27 NOTE — Telephone Encounter (Signed)
I have electronically sent pt's info for [employer] insurance verification and will notify you once I have a response. Thank you. °

## 2015-02-28 DIAGNOSIS — M9902 Segmental and somatic dysfunction of thoracic region: Secondary | ICD-10-CM | POA: Diagnosis not present

## 2015-02-28 DIAGNOSIS — M9903 Segmental and somatic dysfunction of lumbar region: Secondary | ICD-10-CM | POA: Diagnosis not present

## 2015-02-28 DIAGNOSIS — M4716 Other spondylosis with myelopathy, lumbar region: Secondary | ICD-10-CM | POA: Diagnosis not present

## 2015-02-28 DIAGNOSIS — M5431 Sciatica, right side: Secondary | ICD-10-CM | POA: Diagnosis not present

## 2015-02-28 DIAGNOSIS — M4715 Other spondylosis with myelopathy, thoracolumbar region: Secondary | ICD-10-CM | POA: Diagnosis not present

## 2015-02-28 DIAGNOSIS — M9905 Segmental and somatic dysfunction of pelvic region: Secondary | ICD-10-CM | POA: Diagnosis not present

## 2015-03-05 DIAGNOSIS — M9902 Segmental and somatic dysfunction of thoracic region: Secondary | ICD-10-CM | POA: Diagnosis not present

## 2015-03-05 DIAGNOSIS — M5431 Sciatica, right side: Secondary | ICD-10-CM | POA: Diagnosis not present

## 2015-03-05 DIAGNOSIS — M4716 Other spondylosis with myelopathy, lumbar region: Secondary | ICD-10-CM | POA: Diagnosis not present

## 2015-03-05 DIAGNOSIS — M9903 Segmental and somatic dysfunction of lumbar region: Secondary | ICD-10-CM | POA: Diagnosis not present

## 2015-03-05 DIAGNOSIS — M9905 Segmental and somatic dysfunction of pelvic region: Secondary | ICD-10-CM | POA: Diagnosis not present

## 2015-03-05 DIAGNOSIS — M4715 Other spondylosis with myelopathy, thoracolumbar region: Secondary | ICD-10-CM | POA: Diagnosis not present

## 2015-03-05 NOTE — Telephone Encounter (Signed)
Lm on pts vm requesting a call back 

## 2015-03-05 NOTE — Telephone Encounter (Signed)
I have rec'd pt's insurance verification for [employer].  Pt has an estimated responsibility of 0% plus $166 deductible if not already met.  Please advise her this is an estimate and we will not know an exact amt until her insurance has pd.  I have sent a copy of the summary of benefits to be scanned into her chart.  If pt has not met her $166 and cannot afford it for her injection then please advise her to contact [employer] at [phone], option #1 to see if she qualifies for one of their assistance programs.  Also, once she has rec'd her injection, please let me know so I can update the [employer] portal.  If you have any questions, please let me know.  Thank you.

## 2015-03-06 NOTE — Telephone Encounter (Signed)
Lm on pts vm requesting a call back 

## 2015-03-06 NOTE — Telephone Encounter (Signed)
Pt left v/m requesting cb at [phone].

## 2015-03-06 NOTE — Telephone Encounter (Signed)
Spoke to pt and advised. Calcium lab scheduled.

## 2015-03-07 DIAGNOSIS — M9905 Segmental and somatic dysfunction of pelvic region: Secondary | ICD-10-CM | POA: Diagnosis not present

## 2015-03-07 DIAGNOSIS — M4715 Other spondylosis with myelopathy, thoracolumbar region: Secondary | ICD-10-CM | POA: Diagnosis not present

## 2015-03-07 DIAGNOSIS — M9902 Segmental and somatic dysfunction of thoracic region: Secondary | ICD-10-CM | POA: Diagnosis not present

## 2015-03-07 DIAGNOSIS — M4716 Other spondylosis with myelopathy, lumbar region: Secondary | ICD-10-CM | POA: Diagnosis not present

## 2015-03-07 DIAGNOSIS — M9903 Segmental and somatic dysfunction of lumbar region: Secondary | ICD-10-CM | POA: Diagnosis not present

## 2015-03-07 DIAGNOSIS — M5431 Sciatica, right side: Secondary | ICD-10-CM | POA: Diagnosis not present

## 2015-03-21 ENCOUNTER — Other Ambulatory Visit: Payer: Self-pay | Admitting: Family Medicine

## 2015-03-21 DIAGNOSIS — M4716 Other spondylosis with myelopathy, lumbar region: Secondary | ICD-10-CM | POA: Diagnosis not present

## 2015-03-21 DIAGNOSIS — M5431 Sciatica, right side: Secondary | ICD-10-CM | POA: Diagnosis not present

## 2015-03-21 DIAGNOSIS — M9905 Segmental and somatic dysfunction of pelvic region: Secondary | ICD-10-CM | POA: Diagnosis not present

## 2015-03-21 DIAGNOSIS — M810 Age-related osteoporosis without current pathological fracture: Secondary | ICD-10-CM

## 2015-03-21 DIAGNOSIS — M9902 Segmental and somatic dysfunction of thoracic region: Secondary | ICD-10-CM | POA: Diagnosis not present

## 2015-03-21 DIAGNOSIS — M4715 Other spondylosis with myelopathy, thoracolumbar region: Secondary | ICD-10-CM | POA: Diagnosis not present

## 2015-03-21 DIAGNOSIS — M9903 Segmental and somatic dysfunction of lumbar region: Secondary | ICD-10-CM | POA: Diagnosis not present

## 2015-03-27 ENCOUNTER — Other Ambulatory Visit (INDEPENDENT_AMBULATORY_CARE_PROVIDER_SITE_OTHER): Payer: Medicare Other

## 2015-03-27 DIAGNOSIS — M810 Age-related osteoporosis without current pathological fracture: Secondary | ICD-10-CM

## 2015-03-27 LAB — CALCIUM: Calcium: 9.5 mg/dL (ref 8.4–10.5)

## 2015-03-28 DIAGNOSIS — M4716 Other spondylosis with myelopathy, lumbar region: Secondary | ICD-10-CM | POA: Diagnosis not present

## 2015-03-28 DIAGNOSIS — M9903 Segmental and somatic dysfunction of lumbar region: Secondary | ICD-10-CM | POA: Diagnosis not present

## 2015-03-28 DIAGNOSIS — M4715 Other spondylosis with myelopathy, thoracolumbar region: Secondary | ICD-10-CM | POA: Diagnosis not present

## 2015-03-28 DIAGNOSIS — M9902 Segmental and somatic dysfunction of thoracic region: Secondary | ICD-10-CM | POA: Diagnosis not present

## 2015-03-28 DIAGNOSIS — M9905 Segmental and somatic dysfunction of pelvic region: Secondary | ICD-10-CM | POA: Diagnosis not present

## 2015-03-28 DIAGNOSIS — M5431 Sciatica, right side: Secondary | ICD-10-CM | POA: Diagnosis not present

## 2015-03-29 DIAGNOSIS — Z85828 Personal history of other malignant neoplasm of skin: Secondary | ICD-10-CM | POA: Diagnosis not present

## 2015-04-18 DIAGNOSIS — M9903 Segmental and somatic dysfunction of lumbar region: Secondary | ICD-10-CM | POA: Diagnosis not present

## 2015-04-18 DIAGNOSIS — M9905 Segmental and somatic dysfunction of pelvic region: Secondary | ICD-10-CM | POA: Diagnosis not present

## 2015-04-18 DIAGNOSIS — M5431 Sciatica, right side: Secondary | ICD-10-CM | POA: Diagnosis not present

## 2015-04-18 DIAGNOSIS — M4716 Other spondylosis with myelopathy, lumbar region: Secondary | ICD-10-CM | POA: Diagnosis not present

## 2015-04-18 DIAGNOSIS — M4715 Other spondylosis with myelopathy, thoracolumbar region: Secondary | ICD-10-CM | POA: Diagnosis not present

## 2015-04-18 DIAGNOSIS — M9902 Segmental and somatic dysfunction of thoracic region: Secondary | ICD-10-CM | POA: Diagnosis not present

## 2015-04-20 ENCOUNTER — Ambulatory Visit (INDEPENDENT_AMBULATORY_CARE_PROVIDER_SITE_OTHER): Payer: Medicare Other

## 2015-04-20 DIAGNOSIS — M810 Age-related osteoporosis without current pathological fracture: Secondary | ICD-10-CM | POA: Diagnosis not present

## 2015-04-20 MED ORDER — DENOSUMAB 60 MG/ML ~~LOC~~ SOLN
60.0000 mg | Freq: Once | SUBCUTANEOUS | Status: AC
  Administered 2015-04-20: 60 mg via SUBCUTANEOUS

## 2015-05-21 ENCOUNTER — Other Ambulatory Visit (INDEPENDENT_AMBULATORY_CARE_PROVIDER_SITE_OTHER): Payer: Medicare Other

## 2015-05-21 ENCOUNTER — Other Ambulatory Visit: Payer: Self-pay | Admitting: Family Medicine

## 2015-05-21 DIAGNOSIS — E782 Mixed hyperlipidemia: Secondary | ICD-10-CM

## 2015-05-21 DIAGNOSIS — E039 Hypothyroidism, unspecified: Secondary | ICD-10-CM | POA: Diagnosis not present

## 2015-05-21 DIAGNOSIS — Z0000 Encounter for general adult medical examination without abnormal findings: Secondary | ICD-10-CM | POA: Diagnosis not present

## 2015-05-21 DIAGNOSIS — M4716 Other spondylosis with myelopathy, lumbar region: Secondary | ICD-10-CM | POA: Diagnosis not present

## 2015-05-21 DIAGNOSIS — M9903 Segmental and somatic dysfunction of lumbar region: Secondary | ICD-10-CM | POA: Diagnosis not present

## 2015-05-21 DIAGNOSIS — M5431 Sciatica, right side: Secondary | ICD-10-CM | POA: Diagnosis not present

## 2015-05-21 DIAGNOSIS — M4715 Other spondylosis with myelopathy, thoracolumbar region: Secondary | ICD-10-CM | POA: Diagnosis not present

## 2015-05-21 DIAGNOSIS — M9905 Segmental and somatic dysfunction of pelvic region: Secondary | ICD-10-CM | POA: Diagnosis not present

## 2015-05-21 DIAGNOSIS — M9902 Segmental and somatic dysfunction of thoracic region: Secondary | ICD-10-CM | POA: Diagnosis not present

## 2015-05-21 LAB — COMPREHENSIVE METABOLIC PANEL
ALBUMIN: 4.4 g/dL (ref 3.5–5.2)
ALK PHOS: 36 U/L — AB (ref 39–117)
ALT: 13 U/L (ref 0–35)
AST: 17 U/L (ref 0–37)
BUN: 16 mg/dL (ref 6–23)
CO2: 31 mEq/L (ref 19–32)
CREATININE: 0.76 mg/dL (ref 0.40–1.20)
Calcium: 9.5 mg/dL (ref 8.4–10.5)
Chloride: 104 mEq/L (ref 96–112)
GFR: 78.1 mL/min (ref >60.00)
GLUCOSE: 88 mg/dL (ref 70–99)
POTASSIUM: 4.6 meq/L (ref 3.5–5.1)
SODIUM: 142 meq/L (ref 135–145)
TOTAL PROTEIN: 6.4 g/dL (ref 6.0–8.3)
Total Bilirubin: 0.9 mg/dL (ref 0.2–1.2)

## 2015-05-21 LAB — LIPID PANEL
CHOLESTEROL: 204 mg/dL — AB (ref 0–200)
HDL: 72.2 mg/dL (ref >39.00)
LDL CALC: 120 mg/dL — AB (ref 0–99)
NONHDL: 131.97
Total CHOL/HDL Ratio: 3
Triglycerides: 62 mg/dL (ref 0.0–149.0)
VLDL: 12.4 mg/dL (ref 0.0–40.0)

## 2015-05-21 LAB — TSH: TSH: 1.88 u[IU]/mL (ref 0.35–4.50)

## 2015-05-21 LAB — T4, FREE: FREE T4: 0.74 ng/dL (ref 0.60–1.60)

## 2015-05-21 NOTE — Addendum Note (Signed)
Addended by: Ellamae Sia on: 05/21/2015 03:36 PM   Modules accepted: Orders

## 2015-05-23 ENCOUNTER — Encounter: Payer: Medicare Other | Admitting: Family Medicine

## 2015-05-30 ENCOUNTER — Encounter: Payer: Self-pay | Admitting: Family Medicine

## 2015-05-30 ENCOUNTER — Other Ambulatory Visit: Payer: Self-pay | Admitting: Family Medicine

## 2015-05-30 ENCOUNTER — Ambulatory Visit (INDEPENDENT_AMBULATORY_CARE_PROVIDER_SITE_OTHER): Payer: Medicare Other | Admitting: Family Medicine

## 2015-05-30 VITALS — BP 128/68 | HR 70 | Temp 97.9°F | Ht 62.5 in | Wt 94.0 lb

## 2015-05-30 DIAGNOSIS — I341 Nonrheumatic mitral (valve) prolapse: Secondary | ICD-10-CM | POA: Diagnosis not present

## 2015-05-30 DIAGNOSIS — C44721 Squamous cell carcinoma of skin of unspecified lower limb, including hip: Secondary | ICD-10-CM | POA: Insufficient documentation

## 2015-05-30 DIAGNOSIS — E039 Hypothyroidism, unspecified: Secondary | ICD-10-CM | POA: Diagnosis not present

## 2015-05-30 DIAGNOSIS — Z1239 Encounter for other screening for malignant neoplasm of breast: Secondary | ICD-10-CM

## 2015-05-30 DIAGNOSIS — E782 Mixed hyperlipidemia: Secondary | ICD-10-CM

## 2015-05-30 DIAGNOSIS — M810 Age-related osteoporosis without current pathological fracture: Secondary | ICD-10-CM

## 2015-05-30 DIAGNOSIS — R197 Diarrhea, unspecified: Secondary | ICD-10-CM | POA: Diagnosis not present

## 2015-05-30 DIAGNOSIS — Z0000 Encounter for general adult medical examination without abnormal findings: Secondary | ICD-10-CM | POA: Diagnosis not present

## 2015-05-30 DIAGNOSIS — Z8673 Personal history of transient ischemic attack (TIA), and cerebral infarction without residual deficits: Secondary | ICD-10-CM

## 2015-05-30 DIAGNOSIS — K589 Irritable bowel syndrome without diarrhea: Secondary | ICD-10-CM

## 2015-05-30 DIAGNOSIS — Z1231 Encounter for screening mammogram for malignant neoplasm of breast: Secondary | ICD-10-CM

## 2015-05-30 DIAGNOSIS — G4700 Insomnia, unspecified: Secondary | ICD-10-CM

## 2015-05-30 DIAGNOSIS — C44729 Squamous cell carcinoma of skin of left lower limb, including hip: Secondary | ICD-10-CM

## 2015-05-30 MED ORDER — PROGESTERONE MICRONIZED 100 MG PO CAPS: 150.0000 mg | ORAL_CAPSULE | Freq: Every day | ORAL | Status: DC

## 2015-05-30 MED ORDER — THYROID 15 MG PO TABS: 15.0000 mg | ORAL_TABLET | Freq: Every day | ORAL | Status: DC

## 2015-05-30 MED ORDER — BIEST/PROGESTERONE TD CREA: 0.5000 mL | TOPICAL_CREAM | Freq: Every day | TRANSDERMAL | Status: DC

## 2015-05-30 MED ORDER — PROGESTERONE MICRONIZED 100 MG PO CAPS: ORAL_CAPSULE | ORAL | Status: DC

## 2015-05-30 NOTE — Progress Notes (Signed)
Pre visit review using our clinic review tool, if applicable. No additional management support is needed unless otherwise documented below in the visit note. 

## 2015-05-30 NOTE — Assessment & Plan Note (Signed)
Refusing statin. Followed by cards. Continue red yeast rice.

## 2015-05-30 NOTE — Assessment & Plan Note (Signed)
S/p excision. Has follow up scheduled with derm next month.

## 2015-05-30 NOTE — Assessment & Plan Note (Signed)
Improved. No changes made today.

## 2015-05-30 NOTE — Assessment & Plan Note (Signed)
Continue prolia. Due for repeat DEXA, order placed.

## 2015-05-30 NOTE — Addendum Note (Signed)
Addended by: Lucille Passy on: 05/30/2015 09:56 AM   Modules accepted: Orders, SmartSet

## 2015-05-30 NOTE — Assessment & Plan Note (Signed)
Euthyroid.  No changes made today. 

## 2015-05-30 NOTE — Progress Notes (Signed)
HPI Deanna Cobb is a very pleasant 80 yo female here today for medicare annual wellness visit and follow up of chronic medical conditions.  I have personally reviewed the Medicare Annual Wellness questionnaire and have noted 1. The patient's medical and social history 2. Their use of alcohol, tobacco or illicit drugs 3. Their current medications and supplements 4. The patient's functional ability including ADL's, fall risks, home safety risks and hearing or visual             impairment. 5. Diet and physical activities 6. Evidence for depression or mood disorders  Zoster 03/16/08 Td 03/29/09 Pneumovax 03/11/09 Prevnar 13 05/11/14 Mammogram 09/27/13 Colonoscopy- [facility]- 12/18/08- recall in 5 years- but according to Dr. Nichola Cobb note from 08/26/11- she is not due for colonoscopy until 2020. Has dermatologist- sees her again October 2016.  Had squamous cell of left leg excised earlier this year.  End of life wishes discussed and updated in Social History.  The roster of all physicians providing medical care to patient - is listed in the Snapshot section of the chart.   Was followed by Dr. Verl Cobb for management of her history of a TIA , mitral valve prolapse, and hyperlipidemia. Taking low-dose aspirin.  She stopped taking Crestor because she felt it was making her "mind cloudy.  Taking red yeast rice.  Sees Dr. Rockey Cobb- has follow up next month.  Has been having more right hip pain.  Able to walk and exercise now again.  Was not sleeping well but this is improving. Wt Readings from Last 3 Encounters:  05/30/15 94 lb (42.638 kg)  09/20/14 95 lb 8 oz (43.319 kg)  07/27/14 95 lb 8 oz (43.319 kg)    Lab Results  Component Value Date   CHOL 204* 05/21/2015   HDL 72.20 05/21/2015   LDLCALC 120* 05/21/2015   LDLDIRECT 109.0 06/15/2012   TRIG 62.0 05/21/2015   CHOLHDL 3 05/21/2015    Hypothyroidism- on armour.  Denies symptoms of hypo or hyperthyroidism.  Sees a "holistic doctor" who checks her  thyroid.   Lab Results  Component Value Date   TSH 1.88 05/21/2015    Osteoporosis- DEXA 05/22/14.  Has been receiving prolia.  Last injection 04/20/15.    Past Medical History  Diagnosis Date  . Mitral valve prolapse   . Coronary atherosclerosis     D/T lipid rich plaque  . TIA (transient ischemic attack) 04/2005  . Hypothyroidism   . Sarcoidosis 1966  . Osteoporosis   . Internal hemorrhoids   . Colitis 2012  . Allergy     multiple food allergies  . Asthma     as a child  . GERD (gastroesophageal reflux disease)   . Heart murmur   . Hyperlipidemia   . H. pylori infection 2012  . Accidental fall from chair   . Visit for suture removal   . Insomnia     Past Surgical History  Procedure Laterality Date  . Abdominal hysterectomy  1972  . Tonsillectomy    . Lymph node biopsy  1966    Family History  Problem Relation Age of Onset  . Throat cancer Mother   . Alcohol abuse Brother     Social History   Social History  . Marital Status: Married    Spouse Name: N/A  . Number of Children: 4  . Years of Education: N/A   Occupational History  . Retired-RN    Social History Main Topics  . Smoking status: Never Smoker   .  Smokeless tobacco: Never Used  . Alcohol Use: Yes     Comment: 3-4 glasses of wine a week  . Drug Use: No  . Sexual Activity: Not on file   Other Topics Concern  . Not on file   Social History Narrative   Does have a living will.   Would desire CPR but would not want prolonged life support.    Allergies  Allergen Reactions  . Penicillins     REACTION: hives    Current Outpatient Prescriptions  Medication Sig Dispense Refill  . acidophilus (RISAQUAD) CAPS Take 1 capsule by mouth daily.    . Alpha-Lipoic Acid 600 MG CAPS Take by mouth.    Marland Kitchen aspirin 81 MG tablet Take 81 mg by mouth daily.      . cholecalciferol (VITAMIN D) 1000 UNITS tablet Take 1,000 Units by mouth daily.     . Coenzyme Q10 (COQ-10) 100 MG CAPS Take one by mouth  daily    . DHEA 10 MG CAPS Take one by mouth daily    . EST ESTROGENS-METHYLTEST DS PO Take 1 capsule by mouth daily.      Deanna Cobb Factor (INTRINSI B12-FOLATE) 155-208-02 MCG-MCG-MG TABS Take 1 tablet by mouth daily.     . NON FORMULARY Tumeric 300 mg take one tablet daily.    . Red Yeast Rice 600 MG CAPS 1 tab by mouth daily    . thyroid (ARMOUR) 15 MG tablet Take 1 tablet (15 mg total) by mouth daily. 30 tablet 3   No current facility-administered medications for this visit.    Review of Systems  Constitutional: Negative.   HENT: Negative.   Eyes: Negative.   Respiratory: Negative.   Cardiovascular: Negative.   Gastrointestinal: Negative.   Endocrine: Negative.   Genitourinary: Negative.   Musculoskeletal: Positive for back pain.  Allergic/Immunologic: Negative.   Neurological: Negative.   Hematological: Negative.   Psychiatric/Behavioral: Negative.   All other systems reviewed and are negative.     Physical exam  BP 128/68 mmHg  Pulse 70  Temp(Src) 97.9 F (36.6 C) (Oral)  Ht 5' 2.5" (1.588 m)  Wt 94 lb (42.638 kg)  BMI 16.91 kg/m2  SpO2 99% Physical Exam  Constitutional: She is oriented to person, place, and time and well-developed, well-nourished, and in no distress. No distress.  HENT:  Head: Normocephalic and atraumatic.  Eyes: Conjunctivae are normal.  Neck: Normal range of motion. Neck supple.  Cardiovascular: Normal rate, regular rhythm and normal heart sounds.   Pulmonary/Chest: Effort normal and breath sounds normal.  Abdominal: Soft. Bowel sounds are normal.  Musculoskeletal: Normal range of motion. She exhibits no edema or tenderness.  Neurological: She is alert and oriented to person, place, and time.  Skin: Skin is warm and dry. She is not diaphoretic.  Psychiatric: Mood, memory, affect and judgment normal.  Nursing note and vitals reviewed.

## 2015-05-30 NOTE — Patient Instructions (Addendum)
Good to see you. Please call the breast center to set up your appointment for your bone density and mammogram. Have a great time on your river cruise and say hi to Dr. Keenan Bachelor for me.

## 2015-05-30 NOTE — Assessment & Plan Note (Signed)
The patients weight, height, BMI and visual acuity have been recorded in the chart.  Cognitive function assessed.   I have made referrals, counseling and provided education to the patient based review of the above and I have provided the pt with a written personalized care plan for preventive services.  Influenza vaccine given today. 

## 2015-05-31 ENCOUNTER — Telehealth: Payer: Self-pay | Admitting: Family Medicine

## 2015-05-31 NOTE — Telephone Encounter (Signed)
Noted! Thank you

## 2015-05-31 NOTE — Telephone Encounter (Signed)
Spoke with pt about her bone density.  She stated she had one last year and insurance won't pay to have another one this year

## 2015-06-05 DIAGNOSIS — M4715 Other spondylosis with myelopathy, thoracolumbar region: Secondary | ICD-10-CM | POA: Diagnosis not present

## 2015-06-05 DIAGNOSIS — M9903 Segmental and somatic dysfunction of lumbar region: Secondary | ICD-10-CM | POA: Diagnosis not present

## 2015-06-05 DIAGNOSIS — M9905 Segmental and somatic dysfunction of pelvic region: Secondary | ICD-10-CM | POA: Diagnosis not present

## 2015-06-05 DIAGNOSIS — M9902 Segmental and somatic dysfunction of thoracic region: Secondary | ICD-10-CM | POA: Diagnosis not present

## 2015-06-05 DIAGNOSIS — M4716 Other spondylosis with myelopathy, lumbar region: Secondary | ICD-10-CM | POA: Diagnosis not present

## 2015-06-05 DIAGNOSIS — M5431 Sciatica, right side: Secondary | ICD-10-CM | POA: Diagnosis not present

## 2015-06-06 ENCOUNTER — Telehealth: Payer: Self-pay

## 2015-06-06 NOTE — Telephone Encounter (Signed)
Pt has decided wants thyroid to [place]; advised pt to call [facility] and have thyroid med transferred from [place]. Pt voiced understanding.

## 2015-06-08 NOTE — Telephone Encounter (Signed)
Pt request me to ck with [facility] about status of refill for thyroid med; spoke with Richardson Landry at [facility] and rx will be ready in 30 mins. Pt voiced understanding.

## 2015-06-18 DIAGNOSIS — M19031 Primary osteoarthritis, right wrist: Secondary | ICD-10-CM | POA: Diagnosis not present

## 2015-06-18 DIAGNOSIS — M79644 Pain in right finger(s): Secondary | ICD-10-CM | POA: Diagnosis not present

## 2015-06-26 ENCOUNTER — Ambulatory Visit: Payer: Medicare Other

## 2015-07-02 ENCOUNTER — Encounter: Payer: Self-pay | Admitting: Cardiovascular Disease

## 2015-07-02 ENCOUNTER — Ambulatory Visit (INDEPENDENT_AMBULATORY_CARE_PROVIDER_SITE_OTHER): Payer: Medicare Other | Admitting: Cardiovascular Disease

## 2015-07-02 VITALS — BP 100/70 | HR 70 | Ht 62.75 in | Wt 94.5 lb

## 2015-07-02 DIAGNOSIS — I6529 Occlusion and stenosis of unspecified carotid artery: Secondary | ICD-10-CM

## 2015-07-02 DIAGNOSIS — Z8673 Personal history of transient ischemic attack (TIA), and cerebral infarction without residual deficits: Secondary | ICD-10-CM

## 2015-07-02 DIAGNOSIS — I341 Nonrheumatic mitral (valve) prolapse: Secondary | ICD-10-CM

## 2015-07-02 DIAGNOSIS — E782 Mixed hyperlipidemia: Secondary | ICD-10-CM | POA: Diagnosis not present

## 2015-07-02 MED ORDER — EZETIMIBE 10 MG PO TABS: 10.0000 mg | ORAL_TABLET | Freq: Every day | ORAL | Status: DC

## 2015-07-02 NOTE — Assessment & Plan Note (Signed)
No further TIA symptoms

## 2015-07-02 NOTE — Progress Notes (Signed)
Patient ID: Deanna Cobb, female    DOB: 1935-06-21, 80 y.o.   MRN: [id number]  HPI Comments: [name] is a pleasant 80 year old woman with history of TIA in 2006, workup at that time showing mild atherosclerosis of the carotids bilaterally,  echocardiogram in 2006 suggesting borderline mitral valve prolapse, trace MR, normal ejection fraction who presents for routine follow-up of her carotid disease and hyperlipidemia  Overall she reports that she is doing well. Denies any chest pain or symptoms concerning for angina. Active, weight is stable. Currently on no prescription medications. No recent carotid ultrasound. Prior carotid ultrasounds knot in her system  EKG on today's visit shows normal sinus rhythm with rate 70 bpm, APC, no other significant ST or T-wave changes   Allergies  Allergen Reactions  . Clindamycin/Lincomycin   . Penicillins     REACTION: hives    Current Outpatient Prescriptions on File Prior to Visit  Medication Sig Dispense Refill  . acidophilus (RISAQUAD) CAPS Take 1 capsule by mouth daily.    . Alpha-Lipoic Acid 600 MG CAPS Take by mouth.    Marland Kitchen aspirin 81 MG tablet Take 81 mg by mouth daily.      . cholecalciferol (VITAMIN D) 1000 UNITS tablet Take 1,000 Units by mouth daily.     . Coenzyme Q10 (COQ-10) 100 MG CAPS Take one by mouth daily    . DHEA 10 MG CAPS Take one by mouth daily    . Estradiol-Estriol-Progesterone (BIEST/PROGESTERONE) CREA Place 0.5 mLs onto the skin daily. 60 g 3  . Folate-B12-Intrinsic Factor (INTRINSI B12-FOLATE) 235-361-44 MCG-MCG-MG TABS Take 1 tablet by mouth daily.     . NON FORMULARY Tumeric 300 mg take one tablet daily.    . progesterone (PROMETRIUM) 100 MG capsule 150 mg daily 30 capsule 3  . Red Yeast Rice 600 MG CAPS 1 tab by mouth daily    . thyroid (ARMOUR) 15 MG tablet Take 1 tablet (15 mg total) by mouth daily. 30 tablet 3   No current facility-administered medications on file prior to visit.    Past Medical History   Diagnosis Date  . Mitral valve prolapse   . Coronary atherosclerosis     D/T lipid rich plaque  . TIA (transient ischemic attack) 04/2005  . Hypothyroidism   . Sarcoidosis ([place]) 1966  . Osteoporosis   . Internal hemorrhoids   . Colitis 2012  . Allergy     multiple food allergies  . Asthma     as a child  . GERD (gastroesophageal reflux disease)   . Heart murmur   . Hyperlipidemia   . H. pylori infection 2012  . Accidental fall from chair   . Visit for suture removal   . Insomnia     Past Surgical History  Procedure Laterality Date  . Abdominal hysterectomy  1972  . Tonsillectomy    . Lymph node biopsy  1966    Social History  reports that she has never smoked. She has never used smokeless tobacco. She reports that she drinks alcohol. She reports that she does not use illicit drugs.  Family History family history includes Alcohol abuse in her brother; Throat cancer in her mother.    Review of Systems  Constitutional: Negative.   Respiratory: Negative.   Cardiovascular: Negative.   Gastrointestinal: Negative.   Musculoskeletal: Negative.   Neurological: Negative.   Hematological: Negative.   Psychiatric/Behavioral: Negative.   All other systems reviewed and are negative.   BP 100/70 mmHg  Pulse 70  Ht 5' 2.75" (1.594 m)  Wt 94 lb 8 oz (42.865 kg)  BMI 16.87 kg/m2  Physical Exam  Constitutional: She is oriented to person, place, and time. She appears well-developed and well-nourished.  HENT:  Head: Normocephalic.  Nose: Nose normal.  Mouth/Throat: Oropharynx is clear and moist.  Eyes: Conjunctivae are normal. Pupils are equal, round, and reactive to light.  Neck: Normal range of motion. Neck supple. No JVD present.  Cardiovascular: Normal rate, regular rhythm, S1 normal, S2 normal, normal heart sounds and intact distal pulses.  Exam reveals no gallop and no friction rub.   No murmur heard. Pulmonary/Chest: Effort normal and breath sounds normal. No  respiratory distress. She has no wheezes. She has no rales. She exhibits no tenderness.  Abdominal: Soft. Bowel sounds are normal. She exhibits no distension. There is no tenderness.  Musculoskeletal: Normal range of motion. She exhibits no edema or tenderness.  Lymphadenopathy:    She has no cervical adenopathy.  Neurological: She is alert and oriented to person, place, and time. Coordination normal.  Skin: Skin is warm and dry. No rash noted. No erythema.  Psychiatric: She has a normal mood and affect. Her behavior is normal. Judgment and thought content normal.    Assessment and Plan  Nursing note and vitals reviewed.

## 2015-07-02 NOTE — Assessment & Plan Note (Signed)
No symptoms concerning for worsening mitral valve regurgitation

## 2015-07-02 NOTE — Assessment & Plan Note (Signed)
Cholesterol discussed with her. Ideally this should be less than 150 We did suggest she research zetia, a non-statin alternative. Prescription has been sent in for her review and pricing We will base our cholesterol management based on her carotid ultrasound. Higher level of stenosis, would be more aggressive with her cholesterol

## 2015-07-02 NOTE — Assessment & Plan Note (Signed)
Repeat carotid ultrasound ordered.  Previously mild. Not in our system Will use the results to guide our cholesterol management

## 2015-07-02 NOTE — Patient Instructions (Addendum)
You are doing well. No medication changes were made.  Please research zetia for cholesterol  We will schedule a carotid u/s for known carotid atherosclerosis  ____________________________________________________  Please call us if you have new issues that need to be addressed before your next appt.  Your physician wants you to follow-up in: 12 months.  You will receive a reminder letter in the mail two months in advance. If you don't receive a letter, please call our office to schedule the follow-up appointment.    Cholesterol Cholesterol is a white, waxy, fat-like substance needed by your body in small amounts. The liver makes all the cholesterol you need. Cholesterol is carried from the liver by the blood through the blood vessels. Deposits of cholesterol (plaque) may build up on blood vessel walls. These make the arteries narrower and stiffer. Cholesterol plaques increase the risk for heart attack and stroke.  You cannot feel your cholesterol level even if it is very high. The only way to know it is high is with a blood test. Once you know your cholesterol levels, you should keep a record of the test results. Work with your health care provider to keep your levels in the desired range.  WHAT DO THE RESULTS MEAN?  Total cholesterol is a rough measure of all the cholesterol in your blood.   LDL is the so-called bad cholesterol. This is the type that deposits cholesterol in the walls of the arteries. You want this level to be low.   HDL is the good cholesterol because it cleans the arteries and carries the LDL away. You want this level to be high.  Triglycerides are fat that the body can either burn for energy or store. High levels are closely linked to heart disease.  WHAT ARE THE DESIRED LEVELS OF CHOLESTEROL?  Total cholesterol below 200.   LDL below 100 for people at risk, below 70 for those at very high risk.   HDL above 50 is good, above 60 is best.   Triglycerides  below 150.  HOW CAN I LOWER MY CHOLESTEROL?  Diet. Follow your diet programs as directed by your health care provider.   Choose fish or white meat chicken and [place], roasted or baked. Limit fatty cuts of red meat, fried foods, and processed meats, such as sausage and lunch meats.   Eat lots of fresh fruits and vegetables.  Choose whole grains, beans, pasta, potatoes, and cereals.   Use only small amounts of olive, corn, or canola oils.   Avoid butter, mayonnaise, shortening, or palm kernel oils.  Avoid foods with trans fats.   Drink skim or nonfat milk and eat low-fat or nonfat yogurt and cheeses. Avoid whole milk, cream, ice cream, egg yolks, and full-fat cheeses.   Healthy desserts include angel food cake, ginger snaps, animal crackers, hard candy, popsicles, and low-fat or nonfat frozen yogurt. Avoid pastries, cakes, pies, and cookies.   Exercise. Follow your exercise programs as directed by your health care provider.   A regular program helps decrease LDL and raise HDL.   A regular program helps with weight control.   Do things that increase your activity level like gardening, walking, or taking the stairs. Ask your health care provider about how you can be more active in your daily life.   Medicine. Take medicine only as directed by your health care provider.   Medicine may be prescribed by your health care provider to help lower cholesterol and decrease the risk for heart disease.  If you have several risk factors, you may need medicine even if your levels are normal.   This information is not intended to replace advice given to you by your health care provider. Make sure you discuss any questions you have with your health care provider.   Document Released: 05/20/2001 Document Revised: 09/15/2014 Document Reviewed: 06/08/2013 Elsevier Interactive Patient Education 2016 Elsevier Inc. Ezetimibe Tablets What is this medicine? EZETIMIBE (ez ET i mibe)  blocks the absorption of cholesterol from the stomach. It can help lower blood cholesterol for patients who are at risk of getting heart disease or a stroke. It is only for patients whose cholesterol level is not controlled by diet. This medicine may be used for other purposes; ask your health care provider or pharmacist if you have questions. What should I tell my health care provider before I take this medicine? They need to know if you have any of these conditions: -liver disease -an unusual or allergic reaction to ezetimibe, medicines, foods, dyes, or preservatives -pregnant or trying to get pregnant -breast-feeding How should I use this medicine? Take this medicine by mouth with a glass of water. Follow the directions on the prescription label. This medicine can be taken with or without food. Take your doses at regular intervals. Do not take your medicine more often than directed. Talk to your pediatrician regarding the use of this medicine in children. Special care may be needed. Overdosage: If you think you have taken too much of this medicine contact a poison control center or emergency room at once. NOTE: This medicine is only for you. Do not share this medicine with others. What if I miss a dose? If you miss a dose, take it as soon as you can. If it is almost time for your next dose, take only that dose. Do not take double or extra doses. What may interact with this medicine? Do not take this medicine with any of the following medications: -fenofibrate -gemfibrozil This medicine may also interact with the following medications: -antacids -cyclosporine -herbal medicines like red yeast rice -other medicines to lower cholesterol or triglycerides This list may not describe all possible interactions. Give your health care provider a list of all the medicines, herbs, non-prescription drugs, or dietary supplements you use. Also tell them if you smoke, drink alcohol, or use illegal drugs.  Some items may interact with your medicine. What should I watch for while using this medicine? Visit your doctor or health care professional for regular checks on your progress. You will need to have your cholesterol levels checked. If you are also taking some other cholesterol medicines, you will also need to have tests to make sure your liver is working properly. Tell your doctor or health care professional if you get any unexplained muscle pain, tenderness, or weakness, especially if you also have a fever and tiredness. You need to follow a low-cholesterol, low-fat diet while you are taking this medicine. This will decrease your risk of getting heart and blood vessel disease. Exercising and avoiding alcohol and smoking can also help. Ask your doctor or dietician for advice. What side effects may I notice from receiving this medicine? Side effects that you should report to your doctor or health care professional as soon as possible: -allergic reactions like skin rash, itching or hives, swelling of the face, lips, or tongue -dark yellow or brown urine -unusually weak or tired -yellowing of the skin or eyes Side effects that usually do not require medical attention (report to  your doctor or health care professional if they continue or are bothersome): -diarrhea -dizziness -headache -stomach upset or pain This list may not describe all possible side effects. Call your doctor for medical advice about side effects. You may report side effects to FDA at 1-800-FDA-1088. Where should I keep my medicine? Keep out of the reach of children. Store at room temperature between 15 and 30 degrees C (59 and 86 degrees F). Protect from moisture. Keep container tightly closed. Throw away any unused medicine after the expiration date. NOTE: This sheet is a summary. It may not cover all possible information. If you have questions about this medicine, talk to your doctor, pharmacist, or health care provider.     2016, Elsevier/Gold Standard. (2012-03-01 15:39:09)

## 2015-07-19 ENCOUNTER — Other Ambulatory Visit: Payer: Self-pay | Admitting: Cardiovascular Disease

## 2015-07-19 DIAGNOSIS — I6529 Occlusion and stenosis of unspecified carotid artery: Secondary | ICD-10-CM

## 2015-07-19 DIAGNOSIS — I341 Nonrheumatic mitral (valve) prolapse: Secondary | ICD-10-CM

## 2015-07-19 DIAGNOSIS — Z8679 Personal history of other diseases of the circulatory system: Secondary | ICD-10-CM

## 2015-07-24 ENCOUNTER — Ambulatory Visit (INDEPENDENT_AMBULATORY_CARE_PROVIDER_SITE_OTHER): Payer: Medicare Other

## 2015-07-24 DIAGNOSIS — I6529 Occlusion and stenosis of unspecified carotid artery: Secondary | ICD-10-CM

## 2015-07-24 DIAGNOSIS — Z8679 Personal history of other diseases of the circulatory system: Secondary | ICD-10-CM | POA: Diagnosis not present

## 2015-07-24 DIAGNOSIS — I341 Nonrheumatic mitral (valve) prolapse: Secondary | ICD-10-CM

## 2015-07-31 ENCOUNTER — Ambulatory Visit
Admission: RE | Admit: 2015-07-31 | Discharge: 2015-07-31 | Disposition: A | Discharge status: Home / Self Care | Payer: Medicare Other | Source: Ambulatory Visit | Attending: Family Medicine | Admitting: Family Medicine

## 2015-07-31 DIAGNOSIS — Z1231 Encounter for screening mammogram for malignant neoplasm of breast: Secondary | ICD-10-CM | POA: Diagnosis not present

## 2015-08-07 DIAGNOSIS — M9902 Segmental and somatic dysfunction of thoracic region: Secondary | ICD-10-CM | POA: Diagnosis not present

## 2015-08-07 DIAGNOSIS — M4716 Other spondylosis with myelopathy, lumbar region: Secondary | ICD-10-CM | POA: Diagnosis not present

## 2015-08-07 DIAGNOSIS — M5431 Sciatica, right side: Secondary | ICD-10-CM | POA: Diagnosis not present

## 2015-08-07 DIAGNOSIS — M9903 Segmental and somatic dysfunction of lumbar region: Secondary | ICD-10-CM | POA: Diagnosis not present

## 2015-08-07 DIAGNOSIS — M9905 Segmental and somatic dysfunction of pelvic region: Secondary | ICD-10-CM | POA: Diagnosis not present

## 2015-08-07 DIAGNOSIS — M4715 Other spondylosis with myelopathy, thoracolumbar region: Secondary | ICD-10-CM | POA: Diagnosis not present

## 2015-08-17 DIAGNOSIS — M5431 Sciatica, right side: Secondary | ICD-10-CM | POA: Diagnosis not present

## 2015-08-17 DIAGNOSIS — M9905 Segmental and somatic dysfunction of pelvic region: Secondary | ICD-10-CM | POA: Diagnosis not present

## 2015-08-17 DIAGNOSIS — M9902 Segmental and somatic dysfunction of thoracic region: Secondary | ICD-10-CM | POA: Diagnosis not present

## 2015-08-17 DIAGNOSIS — M4716 Other spondylosis with myelopathy, lumbar region: Secondary | ICD-10-CM | POA: Diagnosis not present

## 2015-08-17 DIAGNOSIS — M4715 Other spondylosis with myelopathy, thoracolumbar region: Secondary | ICD-10-CM | POA: Diagnosis not present

## 2015-08-17 DIAGNOSIS — M9903 Segmental and somatic dysfunction of lumbar region: Secondary | ICD-10-CM | POA: Diagnosis not present

## 2015-09-11 DIAGNOSIS — M9902 Segmental and somatic dysfunction of thoracic region: Secondary | ICD-10-CM | POA: Diagnosis not present

## 2015-09-11 DIAGNOSIS — M4716 Other spondylosis with myelopathy, lumbar region: Secondary | ICD-10-CM | POA: Diagnosis not present

## 2015-09-11 DIAGNOSIS — M9905 Segmental and somatic dysfunction of pelvic region: Secondary | ICD-10-CM | POA: Diagnosis not present

## 2015-09-11 DIAGNOSIS — M9903 Segmental and somatic dysfunction of lumbar region: Secondary | ICD-10-CM | POA: Diagnosis not present

## 2015-09-11 DIAGNOSIS — M4715 Other spondylosis with myelopathy, thoracolumbar region: Secondary | ICD-10-CM | POA: Diagnosis not present

## 2015-09-11 DIAGNOSIS — M5431 Sciatica, right side: Secondary | ICD-10-CM | POA: Diagnosis not present

## 2015-09-19 DIAGNOSIS — M9903 Segmental and somatic dysfunction of lumbar region: Secondary | ICD-10-CM | POA: Diagnosis not present

## 2015-09-19 DIAGNOSIS — M9902 Segmental and somatic dysfunction of thoracic region: Secondary | ICD-10-CM | POA: Diagnosis not present

## 2015-09-19 DIAGNOSIS — M5431 Sciatica, right side: Secondary | ICD-10-CM | POA: Diagnosis not present

## 2015-09-19 DIAGNOSIS — M4715 Other spondylosis with myelopathy, thoracolumbar region: Secondary | ICD-10-CM | POA: Diagnosis not present

## 2015-09-19 DIAGNOSIS — M9905 Segmental and somatic dysfunction of pelvic region: Secondary | ICD-10-CM | POA: Diagnosis not present

## 2015-09-19 DIAGNOSIS — M4716 Other spondylosis with myelopathy, lumbar region: Secondary | ICD-10-CM | POA: Diagnosis not present

## 2015-09-20 ENCOUNTER — Telehealth: Payer: Self-pay | Admitting: Family Medicine

## 2015-09-20 NOTE — Telephone Encounter (Signed)
Pt is having trouble with hip and has been seeing chiropractor. He told her that is she got it through Dr Deborra Medina, that medicare will pay for it., cb number is [phone]

## 2015-09-20 NOTE — Telephone Encounter (Signed)
Notified pt of answer.  Pt will be in for xray

## 2015-09-20 NOTE — Telephone Encounter (Signed)
Ok to schedule hip xray here.

## 2015-09-21 ENCOUNTER — Ambulatory Visit (INDEPENDENT_AMBULATORY_CARE_PROVIDER_SITE_OTHER)
Admission: RE | Admit: 2015-09-21 | Discharge: 2015-09-21 | Disposition: A | Discharge status: Home / Self Care | Payer: Medicare Other | Source: Ambulatory Visit | Attending: Family Medicine | Admitting: Family Medicine

## 2015-09-21 ENCOUNTER — Other Ambulatory Visit: Payer: Self-pay | Admitting: Family Medicine

## 2015-09-21 ENCOUNTER — Telehealth: Payer: Self-pay | Admitting: Family Medicine

## 2015-09-21 DIAGNOSIS — M25551 Pain in right hip: Secondary | ICD-10-CM | POA: Diagnosis not present

## 2015-09-21 NOTE — Telephone Encounter (Signed)
Pt returned call about xray,  Informed that results were normal per Dr. Deborra Medina.

## 2015-09-27 DIAGNOSIS — M4715 Other spondylosis with myelopathy, thoracolumbar region: Secondary | ICD-10-CM | POA: Diagnosis not present

## 2015-09-27 DIAGNOSIS — M5431 Sciatica, right side: Secondary | ICD-10-CM | POA: Diagnosis not present

## 2015-09-27 DIAGNOSIS — M9905 Segmental and somatic dysfunction of pelvic region: Secondary | ICD-10-CM | POA: Diagnosis not present

## 2015-09-27 DIAGNOSIS — M4716 Other spondylosis with myelopathy, lumbar region: Secondary | ICD-10-CM | POA: Diagnosis not present

## 2015-09-27 DIAGNOSIS — M9902 Segmental and somatic dysfunction of thoracic region: Secondary | ICD-10-CM | POA: Diagnosis not present

## 2015-09-27 DIAGNOSIS — M9903 Segmental and somatic dysfunction of lumbar region: Secondary | ICD-10-CM | POA: Diagnosis not present

## 2015-10-04 DIAGNOSIS — M9902 Segmental and somatic dysfunction of thoracic region: Secondary | ICD-10-CM | POA: Diagnosis not present

## 2015-10-04 DIAGNOSIS — M9905 Segmental and somatic dysfunction of pelvic region: Secondary | ICD-10-CM | POA: Diagnosis not present

## 2015-10-04 DIAGNOSIS — M4715 Other spondylosis with myelopathy, thoracolumbar region: Secondary | ICD-10-CM | POA: Diagnosis not present

## 2015-10-04 DIAGNOSIS — M9903 Segmental and somatic dysfunction of lumbar region: Secondary | ICD-10-CM | POA: Diagnosis not present

## 2015-10-04 DIAGNOSIS — M4716 Other spondylosis with myelopathy, lumbar region: Secondary | ICD-10-CM | POA: Diagnosis not present

## 2015-10-04 DIAGNOSIS — M5431 Sciatica, right side: Secondary | ICD-10-CM | POA: Diagnosis not present

## 2015-10-11 DIAGNOSIS — M9902 Segmental and somatic dysfunction of thoracic region: Secondary | ICD-10-CM | POA: Diagnosis not present

## 2015-10-11 DIAGNOSIS — M9903 Segmental and somatic dysfunction of lumbar region: Secondary | ICD-10-CM | POA: Diagnosis not present

## 2015-10-11 DIAGNOSIS — M4715 Other spondylosis with myelopathy, thoracolumbar region: Secondary | ICD-10-CM | POA: Diagnosis not present

## 2015-10-11 DIAGNOSIS — M9905 Segmental and somatic dysfunction of pelvic region: Secondary | ICD-10-CM | POA: Diagnosis not present

## 2015-10-11 DIAGNOSIS — M4716 Other spondylosis with myelopathy, lumbar region: Secondary | ICD-10-CM | POA: Diagnosis not present

## 2015-10-11 DIAGNOSIS — M5431 Sciatica, right side: Secondary | ICD-10-CM | POA: Diagnosis not present

## 2015-11-28 ENCOUNTER — Other Ambulatory Visit: Payer: Self-pay

## 2015-11-28 NOTE — Telephone Encounter (Signed)
Pt left v/m; the last time pt got armour thyroid was 15 mg and previously pt had been taking 30 mg. Pt has 2 tabs left and request cb. Pt last seen 05/30/15 for annual exam and armour thyroid 15 mg last refilled # 30 x 3 on 05/30/15.Please advise. [facility].

## 2015-11-29 DIAGNOSIS — M9905 Segmental and somatic dysfunction of pelvic region: Secondary | ICD-10-CM | POA: Diagnosis not present

## 2015-11-29 DIAGNOSIS — M4715 Other spondylosis with myelopathy, thoracolumbar region: Secondary | ICD-10-CM | POA: Diagnosis not present

## 2015-11-29 DIAGNOSIS — M9903 Segmental and somatic dysfunction of lumbar region: Secondary | ICD-10-CM | POA: Diagnosis not present

## 2015-11-29 DIAGNOSIS — M4716 Other spondylosis with myelopathy, lumbar region: Secondary | ICD-10-CM | POA: Diagnosis not present

## 2015-11-29 DIAGNOSIS — M9902 Segmental and somatic dysfunction of thoracic region: Secondary | ICD-10-CM | POA: Diagnosis not present

## 2015-11-29 DIAGNOSIS — M5431 Sciatica, right side: Secondary | ICD-10-CM | POA: Diagnosis not present

## 2015-11-29 MED ORDER — THYROID 15 MG PO TABS: 15.0000 mg | ORAL_TABLET | Freq: Every day | ORAL | Status: DC

## 2015-11-29 NOTE — Telephone Encounter (Signed)
Spoke to pt and who states that she had been taking 15mg  BID, but recently reduced to 15mg  daily. States she will continue with 15mg  and new Rx sent to pharmacy

## 2015-11-29 NOTE — Telephone Encounter (Signed)
We have 15 mg on her med list and thyroid function normal in 05/2015.  How has she been taking it?

## 2015-12-04 DIAGNOSIS — M9903 Segmental and somatic dysfunction of lumbar region: Secondary | ICD-10-CM | POA: Diagnosis not present

## 2015-12-04 DIAGNOSIS — M9905 Segmental and somatic dysfunction of pelvic region: Secondary | ICD-10-CM | POA: Diagnosis not present

## 2015-12-04 DIAGNOSIS — M4715 Other spondylosis with myelopathy, thoracolumbar region: Secondary | ICD-10-CM | POA: Diagnosis not present

## 2015-12-04 DIAGNOSIS — M5431 Sciatica, right side: Secondary | ICD-10-CM | POA: Diagnosis not present

## 2015-12-04 DIAGNOSIS — M9902 Segmental and somatic dysfunction of thoracic region: Secondary | ICD-10-CM | POA: Diagnosis not present

## 2015-12-04 DIAGNOSIS — M4716 Other spondylosis with myelopathy, lumbar region: Secondary | ICD-10-CM | POA: Diagnosis not present

## 2015-12-25 ENCOUNTER — Telehealth: Payer: Self-pay | Admitting: Family Medicine

## 2015-12-25 NOTE — Telephone Encounter (Signed)
TH scheduled appt with Dr Deborra Medina on 12/26/15 at 9 AM.

## 2015-12-25 NOTE — Telephone Encounter (Signed)
Patient Name: Deanna Cobb  DOB: January 01, 1935    Initial Comment caller states she is having diarrhea and her stools have changed color   Nurse Assessment  Nurse: Leilani Merl, RN, Heather Date/Time (Eastern Time): 12/25/2015 9:47:04 AM  Confirm and document reason for call. If symptomatic, describe symptoms. You must click the next button to save text entered. ---caller states she is having diarrhea and her stools have changed color, she had an episode on Saturday and 4 episodes today. She took pepto on Saturday.  Has the patient traveled out of the country within the last 30 days? ---Not Applicable  Does the patient have any new or worsening symptoms? ---Yes  Will a triage be completed? ---Yes  Related visit to physician within the last 2 weeks? ---No  Does the PT have any chronic conditions? (i.e. diabetes, asthma, etc.) ---Yes  List chronic conditions. ---See MR  Is this a behavioral health or substance abuse call? ---No     Guidelines    Guideline Title Affirmed Question Affirmed Notes  Rectal Bleeding Age > 50 years    Final Disposition User   See Physician within 24 Hours Standifer, RN, Water quality scientist    Comments  Appt with Dr. Deborra Medina tomorrow at 9:00am.   Referrals  REFERRED TO PCP OFFICE   Disagree/Comply: Comply

## 2015-12-26 ENCOUNTER — Ambulatory Visit (INDEPENDENT_AMBULATORY_CARE_PROVIDER_SITE_OTHER): Payer: Medicare Other | Admitting: Family Medicine

## 2015-12-26 ENCOUNTER — Encounter: Payer: Self-pay | Admitting: *Deleted

## 2015-12-26 ENCOUNTER — Encounter: Payer: Self-pay | Admitting: Family Medicine

## 2015-12-26 VITALS — BP 100/60 | HR 62 | Temp 97.4°F | Wt 96.2 lb

## 2015-12-26 DIAGNOSIS — R197 Diarrhea, unspecified: Secondary | ICD-10-CM | POA: Diagnosis not present

## 2015-12-26 DIAGNOSIS — K921 Melena: Secondary | ICD-10-CM

## 2015-12-26 DIAGNOSIS — Z1211 Encounter for screening for malignant neoplasm of colon: Secondary | ICD-10-CM

## 2015-12-26 DIAGNOSIS — E039 Hypothyroidism, unspecified: Secondary | ICD-10-CM

## 2015-12-26 LAB — TSH: TSH: 2.58 u[IU]/mL (ref 0.35–4.50)

## 2015-12-26 LAB — T3, FREE: T3 FREE: 3.2 pg/mL (ref 2.3–4.2)

## 2015-12-26 LAB — T4, FREE: Free T4: 0.86 ng/dL (ref 0.60–1.60)

## 2015-12-26 NOTE — Assessment & Plan Note (Signed)
Resolved.  Likely due to her IBS symptoms but I did encourage her to have her screening colonoscopy which is overdue- referral placed.

## 2015-12-26 NOTE — Progress Notes (Signed)
Pre visit review using our clinic review tool, if applicable. No additional management support is needed unless otherwise documented below in the visit note. 

## 2015-12-26 NOTE — Patient Instructions (Signed)
Great to see you. Please stop by to see [facility] on your way out.   

## 2015-12-26 NOTE — Addendum Note (Signed)
Addended by: Lucille Passy on: 12/26/2015 09:21 AM   Modules accepted: Orders

## 2015-12-26 NOTE — Progress Notes (Signed)
Subjective:   Patient ID: Deanna Cobb, female    DOB: 1935-08-30, 81 y.o.   MRN: [id number]  Deanna Cobb is a pleasant 81 y.o. year old female who presents to clinic today with Diarrhea and dark stool  on 12/26/2015  HPI:  H/o IBS. Frequently has bouts of diarrhea and takes immodium for this.  This weekend, she took both immondium and pepto bismol and had black stools the next two days. Has not had a BM since.  No blood in stool. No abdominal pain.  No nausea or vomiting. No fever.  Has been followed by Dr. Olevia Perches in past. Neg endoscopy in 2012.  Was scheduled to have colonoscopy in 09/2014 but had to cancel.  Asking to be referred to another provider since Dr. Olevia Perches retired for her screening colonoscopy.  Current Outpatient Prescriptions on File Prior to Visit  Medication Sig Dispense Refill  . acidophilus (RISAQUAD) CAPS Take 1 capsule by mouth daily.    . Alpha-Lipoic Acid 600 MG CAPS Take by mouth.    Marland Kitchen aspirin 81 MG tablet Take 81 mg by mouth daily.      . cholecalciferol (VITAMIN D) 1000 UNITS tablet Take 1,000 Units by mouth daily.     . Coenzyme Q10 (COQ-10) 100 MG CAPS Take one by mouth daily    . DHEA 10 MG CAPS Take one by mouth daily    . Estradiol-Estriol-Progesterone (BIEST/PROGESTERONE) CREA Place 0.5 mLs onto the skin daily. 60 g 3  . Folate-B12-Intrinsic Factor (INTRINSI B12-FOLATE) [id number] MCG-MCG-MG TABS Take 1 tablet by mouth daily.     . NON FORMULARY Tumeric 300 mg take one tablet daily.    . progesterone (PROMETRIUM) 100 MG capsule 150 mg daily 30 capsule 3  . Red Yeast Rice 600 MG CAPS 1 tab by mouth daily    . thyroid (ARMOUR) 15 MG tablet Take 1 tablet (15 mg total) by mouth daily. 90 tablet 1  . ezetimibe (ZETIA) 10 MG tablet Take 1 tablet (10 mg total) by mouth daily. (Patient not taking: Reported on 12/26/2015) 30 tablet 12   No current facility-administered medications on file prior to visit.    Allergies  Allergen Reactions  .  Clindamycin/Lincomycin   . Penicillins     REACTION: hives    Past Medical History  Diagnosis Date  . Mitral valve prolapse   . Coronary atherosclerosis     D/T lipid rich plaque  . TIA (transient ischemic attack) 04/2005  . Hypothyroidism   . Sarcoidosis ([place]) 1966  . Osteoporosis   . Internal hemorrhoids   . Colitis 2012  . Allergy     multiple food allergies  . Asthma     as a child  . GERD (gastroesophageal reflux disease)   . Heart murmur   . Hyperlipidemia   . H. pylori infection 2012  . Accidental fall from chair   . Visit for suture removal   . Insomnia     Past Surgical History  Procedure Laterality Date  . Abdominal hysterectomy  1972  . Tonsillectomy    . Lymph node biopsy  1966    Family History  Problem Relation Age of Onset  . Throat cancer Mother   . Alcohol abuse Brother     Social History   Social History  . Marital Status: Married    Spouse Name: N/A  . Number of Children: 4  . Years of Education: N/A   Occupational History  . Retired-RN  Social History Main Topics  . Smoking status: Never Smoker   . Smokeless tobacco: Never Used  . Alcohol Use: Yes     Comment: 3-4 glasses of wine a week  . Drug Use: No  . Sexual Activity: Not on file   Other Topics Concern  . Not on file   Social History Narrative   Does have a living will.   Would desire CPR but would not want prolonged life support.   The PMH, PSH, Social History, Family History, Medications, and allergies have been reviewed in [facility], and have been updated if relevant.    Review of Systems  Gastrointestinal: Positive for diarrhea. Negative for nausea, vomiting, abdominal pain, blood in stool, abdominal distention, anal bleeding and rectal pain.  All other systems reviewed and are negative.      Objective:    BP 100/60 mmHg  Pulse 62  Temp(Src) 97.4 F (36.3 C) (Oral)  Wt 96 lb 4 oz (43.659 kg)  SpO2 100%   Physical Exam  Constitutional: She is oriented to  person, place, and time. She appears well-developed and well-nourished. No distress.  HENT:  Head: Normocephalic.  Eyes: Conjunctivae are normal.  Pulmonary/Chest: Effort normal.  Genitourinary: Rectum normal. Rectal exam shows no external hemorrhoid and no fissure. Guaiac negative stool.  Musculoskeletal: Normal range of motion.  Neurological: She is alert and oriented to person, place, and time. No cranial nerve deficit.  Skin: Skin is warm and dry. She is not diaphoretic.  Psychiatric: She has a normal mood and affect. Her behavior is normal. Judgment and thought content normal.  Nursing note and vitals reviewed.         Assessment & Plan:   Diarrhea, unspecified type  Black stool No Follow-up on file.

## 2015-12-26 NOTE — Assessment & Plan Note (Signed)
New- resolved. Guaic neg. Likely due to pepto bismol. Call or return to clinic prn if these symptoms worsen or fail to improve as anticipated. The patient indicates understanding of these issues and agrees with the plan.

## 2015-12-28 DIAGNOSIS — M9902 Segmental and somatic dysfunction of thoracic region: Secondary | ICD-10-CM | POA: Diagnosis not present

## 2015-12-28 DIAGNOSIS — M4716 Other spondylosis with myelopathy, lumbar region: Secondary | ICD-10-CM | POA: Diagnosis not present

## 2015-12-28 DIAGNOSIS — M9905 Segmental and somatic dysfunction of pelvic region: Secondary | ICD-10-CM | POA: Diagnosis not present

## 2015-12-28 DIAGNOSIS — M4715 Other spondylosis with myelopathy, thoracolumbar region: Secondary | ICD-10-CM | POA: Diagnosis not present

## 2015-12-28 DIAGNOSIS — M5431 Sciatica, right side: Secondary | ICD-10-CM | POA: Diagnosis not present

## 2015-12-28 DIAGNOSIS — M9903 Segmental and somatic dysfunction of lumbar region: Secondary | ICD-10-CM | POA: Diagnosis not present

## 2016-01-02 DIAGNOSIS — Z86010 Personal history of colonic polyps: Secondary | ICD-10-CM | POA: Diagnosis not present

## 2016-01-02 DIAGNOSIS — A09 Infectious gastroenteritis and colitis, unspecified: Secondary | ICD-10-CM | POA: Diagnosis not present

## 2016-01-03 DIAGNOSIS — K5730 Diverticulosis of large intestine without perforation or abscess without bleeding: Secondary | ICD-10-CM | POA: Diagnosis not present

## 2016-01-03 DIAGNOSIS — Z86010 Personal history of colonic polyps: Secondary | ICD-10-CM | POA: Diagnosis not present

## 2016-01-11 ENCOUNTER — Encounter: Payer: Self-pay | Admitting: Family Medicine

## 2016-01-22 DIAGNOSIS — M9903 Segmental and somatic dysfunction of lumbar region: Secondary | ICD-10-CM | POA: Diagnosis not present

## 2016-01-22 DIAGNOSIS — M4716 Other spondylosis with myelopathy, lumbar region: Secondary | ICD-10-CM | POA: Diagnosis not present

## 2016-01-22 DIAGNOSIS — M4715 Other spondylosis with myelopathy, thoracolumbar region: Secondary | ICD-10-CM | POA: Diagnosis not present

## 2016-01-22 DIAGNOSIS — M9902 Segmental and somatic dysfunction of thoracic region: Secondary | ICD-10-CM | POA: Diagnosis not present

## 2016-01-22 DIAGNOSIS — M5431 Sciatica, right side: Secondary | ICD-10-CM | POA: Diagnosis not present

## 2016-01-22 DIAGNOSIS — M9905 Segmental and somatic dysfunction of pelvic region: Secondary | ICD-10-CM | POA: Diagnosis not present

## 2016-01-25 DIAGNOSIS — M3501 Sicca syndrome with keratoconjunctivitis: Secondary | ICD-10-CM | POA: Diagnosis not present

## 2016-01-28 DIAGNOSIS — M3501 Sicca syndrome with keratoconjunctivitis: Secondary | ICD-10-CM | POA: Diagnosis not present

## 2016-02-11 ENCOUNTER — Encounter: Payer: Self-pay | Admitting: Family Medicine

## 2016-02-11 ENCOUNTER — Ambulatory Visit (INDEPENDENT_AMBULATORY_CARE_PROVIDER_SITE_OTHER): Payer: Medicare Other | Admitting: Family Medicine

## 2016-02-11 ENCOUNTER — Telehealth: Payer: Self-pay | Admitting: Family Medicine

## 2016-02-11 VITALS — BP 103/53 | HR 69 | Temp 98.5°F | Ht 62.5 in | Wt 93.2 lb

## 2016-02-11 DIAGNOSIS — J069 Acute upper respiratory infection, unspecified: Secondary | ICD-10-CM | POA: Insufficient documentation

## 2016-02-11 DIAGNOSIS — B9789 Other viral agents as the cause of diseases classified elsewhere: Principal | ICD-10-CM

## 2016-02-11 MED ORDER — GUAIFENESIN-CODEINE 100-10 MG/5ML PO SYRP: 5.0000 mL | ORAL_SOLUTION | Freq: Every evening | ORAL | Status: DC | PRN

## 2016-02-11 NOTE — Progress Notes (Signed)
Pre visit review using our clinic review tool, if applicable. No additional management support is needed unless otherwise documented below in the visit note. 

## 2016-02-11 NOTE — Assessment & Plan Note (Signed)
Symptomatic care. Sent in rx for prescription cough suppressant to use at night.

## 2016-02-11 NOTE — Telephone Encounter (Signed)
Ms. Cabada requested her next Prolia injection when checking out w/Marion.  Ok to go ahead and check this?  Thank you!

## 2016-02-11 NOTE — Progress Notes (Signed)
   Subjective:    Patient ID: Deanna Cobb, female    DOB: 1935-03-12, 81 y.o.   MRN: [id number]  Cough This is a new problem. The current episode started in the past 7 days. The problem has been gradually worsening. The cough is non-productive. Associated symptoms include ear pain and a sore throat. Pertinent negatives include no fever, headaches, nasal congestion, postnasal drip, rhinorrhea, shortness of breath or wheezing. The symptoms are aggravated by lying down. Risk factors: nonsmoker. Treatments tried: mucinex. The treatment provided mild relief. There is no history of asthma or COPD.  Sore Throat  Associated symptoms include coughing and ear pain. Pertinent negatives include no headaches or shortness of breath.   Social History /Family History/Past Medical History reviewed and updated if needed.    Review of Systems  Constitutional: Negative for fever.  HENT: Positive for ear pain and sore throat. Negative for postnasal drip and rhinorrhea.   Respiratory: Positive for cough. Negative for shortness of breath and wheezing.   Neurological: Negative for headaches.       Objective:   Physical Exam  Constitutional: Vital signs are normal. She appears well-developed and well-nourished. She is cooperative.  Non-toxic appearance. She does not appear ill. No distress.  HENT:  Head: Normocephalic.  Right Ear: Hearing, tympanic membrane, external ear and ear canal normal. Tympanic membrane is not erythematous, not retracted and not bulging.  Left Ear: Hearing, tympanic membrane, external ear and ear canal normal. Tympanic membrane is not erythematous, not retracted and not bulging.  Nose: Mucosal edema and rhinorrhea present. Right sinus exhibits no maxillary sinus tenderness and no frontal sinus tenderness. Left sinus exhibits no maxillary sinus tenderness and no frontal sinus tenderness.  Mouth/Throat: Uvula is midline, oropharynx is clear and moist and mucous membranes are normal.    Eyes: Conjunctivae, EOM and lids are normal. Pupils are equal, round, and reactive to light. Lids are everted and swept, no foreign bodies found.  Neck: Trachea normal and normal range of motion. Neck supple. Carotid bruit is not present. No thyroid mass and no thyromegaly present.  Cardiovascular: Normal rate, regular rhythm, S1 normal, S2 normal, normal heart sounds, intact distal pulses and normal pulses.  Exam reveals no gallop and no friction rub.   No murmur heard. Pulmonary/Chest: Effort normal and breath sounds normal. No tachypnea. No respiratory distress. She has no decreased breath sounds. She has no wheezes. She has no rhonchi. She has no rales.  Neurological: She is alert.  Skin: Skin is warm, dry and intact. No rash noted.  Psychiatric: Her speech is normal and behavior is normal. Judgment normal. Her mood appears not anxious. Cognition and memory are normal. She does not exhibit a depressed mood.          Assessment & Plan:

## 2016-02-11 NOTE — Telephone Encounter (Signed)
I have electronically submitted pt's info for Prol[employer] insurance verification and will notify you once I have a response. Thank you. °

## 2016-02-11 NOTE — Patient Instructions (Signed)
Cough suppressant at night. Mucinex DM during the day. Rest, fluids.  Call if not improving as expected.

## 2016-02-11 NOTE — Telephone Encounter (Signed)
Yes ok 

## 2016-02-12 ENCOUNTER — Telehealth: Payer: Self-pay | Admitting: *Deleted

## 2016-02-12 MED ORDER — BENZONATATE 200 MG PO CAPS: 200.0000 mg | ORAL_CAPSULE | Freq: Two times a day (BID) | ORAL | Status: DC | PRN

## 2016-02-12 NOTE — Telephone Encounter (Signed)
Sent in rx for benzonatate which does not cause drowsiness and will hopefully help with cough with out causing oversedation.

## 2016-02-12 NOTE — Telephone Encounter (Signed)
Patient's husband called stating that she was seen yesterday and given a cough medication. [name] stated that the cough medication must be a long acting medication because patient was still hung over this morning. [name] stated that the cough medication did not help the patient's cough and she even had to get up during the night to take a second dose. [name] wants to know if there is something else that can be given that will help with her cough since this did not? Pharmacy [facility]

## 2016-02-12 NOTE — Telephone Encounter (Signed)
Left message for Mrs. Peris that a prescription for benzonatate has been sent to [facility] [address]

## 2016-02-12 NOTE — Telephone Encounter (Signed)
I have rec'd Ms. Eltringham insurance verification for Prolia.  She will have an estimated responsibility of $0 after she has met her $183 deductible.  Please make pt aware this is an estimate and we will not know an exact amt until insurance(s) has/have paid.  I have sent a copy of the summary of benefits to be scanned into pt's chart.    If pt  Has not met her deductible and cannot afford the remaining for her injection, please advise her to contact [employer] at [phone] and select option #1 to see if she qualifies for one of their assistance programs.  If she qualifies they will instruct her how to proceed.   Once pt recs injection, please let me know actual injection date so I can update the [employer] portal.  If you have any questions, please let me know.  Thank you!  Cc: Beatriz Stallion (for ordering purposes)

## 2016-02-12 NOTE — Telephone Encounter (Signed)
Spoke to pt and advised of benefits. Ca lab scheduled.

## 2016-02-13 ENCOUNTER — Other Ambulatory Visit (INDEPENDENT_AMBULATORY_CARE_PROVIDER_SITE_OTHER): Payer: Medicare Other

## 2016-02-13 DIAGNOSIS — M810 Age-related osteoporosis without current pathological fracture: Secondary | ICD-10-CM

## 2016-02-13 LAB — CALCIUM: Calcium: 9.6 mg/dL (ref 8.4–10.5)

## 2016-02-13 NOTE — Telephone Encounter (Signed)
I ordered a Prolia

## 2016-02-18 NOTE — Telephone Encounter (Signed)
Pt left v/m requesting cb about CA lab results and scheduling prolia inj.

## 2016-02-19 ENCOUNTER — Ambulatory Visit (INDEPENDENT_AMBULATORY_CARE_PROVIDER_SITE_OTHER): Payer: Medicare Other | Admitting: *Deleted

## 2016-02-19 DIAGNOSIS — M810 Age-related osteoporosis without current pathological fracture: Secondary | ICD-10-CM

## 2016-02-19 MED ORDER — DENOSUMAB 60 MG/ML ~~LOC~~ SOLN
60.0000 mg | Freq: Once | SUBCUTANEOUS | Status: AC
  Administered 2016-02-19: 60 mg via SUBCUTANEOUS

## 2016-02-19 NOTE — Telephone Encounter (Signed)
Spoke to pt and informed her of results. Injection scheduled.

## 2016-04-22 ENCOUNTER — Telehealth: Payer: Self-pay | Admitting: Family Medicine

## 2016-04-22 ENCOUNTER — Ambulatory Visit: Payer: Medicare Other | Admitting: Internal Medicine

## 2016-04-22 ENCOUNTER — Encounter (HOSPITAL_COMMUNITY): Payer: Self-pay | Admitting: *Deleted

## 2016-04-22 ENCOUNTER — Emergency Department (HOSPITAL_COMMUNITY)
Admission: EM | Admit: 2016-04-22 | Discharge: 2016-04-22 | Disposition: A | Discharge status: Home / Self Care | Payer: Medicare Other | Attending: Emergency Medicine | Admitting: Emergency Medicine

## 2016-04-22 DIAGNOSIS — M4715 Other spondylosis with myelopathy, thoracolumbar region: Secondary | ICD-10-CM | POA: Diagnosis not present

## 2016-04-22 DIAGNOSIS — Z79899 Other long term (current) drug therapy: Secondary | ICD-10-CM | POA: Insufficient documentation

## 2016-04-22 DIAGNOSIS — J45909 Unspecified asthma, uncomplicated: Secondary | ICD-10-CM | POA: Diagnosis not present

## 2016-04-22 DIAGNOSIS — M9903 Segmental and somatic dysfunction of lumbar region: Secondary | ICD-10-CM | POA: Diagnosis not present

## 2016-04-22 DIAGNOSIS — S90512A Abrasion, left ankle, initial encounter: Secondary | ICD-10-CM | POA: Diagnosis not present

## 2016-04-22 DIAGNOSIS — M9905 Segmental and somatic dysfunction of pelvic region: Secondary | ICD-10-CM | POA: Diagnosis not present

## 2016-04-22 DIAGNOSIS — Z7982 Long term (current) use of aspirin: Secondary | ICD-10-CM | POA: Insufficient documentation

## 2016-04-22 DIAGNOSIS — E039 Hypothyroidism, unspecified: Secondary | ICD-10-CM | POA: Insufficient documentation

## 2016-04-22 DIAGNOSIS — W1800XA Striking against unspecified object with subsequent fall, initial encounter: Secondary | ICD-10-CM | POA: Diagnosis not present

## 2016-04-22 DIAGNOSIS — S8992XA Unspecified injury of left lower leg, initial encounter: Secondary | ICD-10-CM | POA: Diagnosis present

## 2016-04-22 DIAGNOSIS — Y929 Unspecified place or not applicable: Secondary | ICD-10-CM | POA: Insufficient documentation

## 2016-04-22 DIAGNOSIS — I2510 Atherosclerotic heart disease of native coronary artery without angina pectoris: Secondary | ICD-10-CM | POA: Insufficient documentation

## 2016-04-22 DIAGNOSIS — Y999 Unspecified external cause status: Secondary | ICD-10-CM | POA: Diagnosis not present

## 2016-04-22 DIAGNOSIS — Y9301 Activity, walking, marching and hiking: Secondary | ICD-10-CM | POA: Diagnosis not present

## 2016-04-22 DIAGNOSIS — M5431 Sciatica, right side: Secondary | ICD-10-CM | POA: Diagnosis not present

## 2016-04-22 DIAGNOSIS — W19XXXA Unspecified fall, initial encounter: Secondary | ICD-10-CM

## 2016-04-22 DIAGNOSIS — S80212A Abrasion, left knee, initial encounter: Secondary | ICD-10-CM | POA: Insufficient documentation

## 2016-04-22 DIAGNOSIS — M4716 Other spondylosis with myelopathy, lumbar region: Secondary | ICD-10-CM | POA: Diagnosis not present

## 2016-04-22 DIAGNOSIS — S0083XA Contusion of other part of head, initial encounter: Secondary | ICD-10-CM | POA: Diagnosis not present

## 2016-04-22 DIAGNOSIS — M9902 Segmental and somatic dysfunction of thoracic region: Secondary | ICD-10-CM | POA: Diagnosis not present

## 2016-04-22 NOTE — ED Provider Notes (Signed)
[facility] DEPT Provider Note   CSN: [id number] Arrival date & time: 04/22/16  1239     History   Chief Complaint Chief Complaint  Patient presents with  . Fall    HPI Deanna Cobb is a 81 y.o. female.   Fall  Pertinent negatives include no chest pain, no abdominal pain, no headaches and no shortness of breath.  10 days ago patient was walking her dog and states the leash got caught on her sandals and she fell forward. She went in her knees and hit her face. She states she hurts a little in her face and her knee. She has had black eyes and say the getting better. States that today she had a normal visit with her chiropractor for her back pain. She has no back pain from a fall but states sometimes with her flights she gets pain. She is due to fly to [place] in 3 days. She states that because of the black eyes the chiropractor did some tests and found that she was weak. She states that she had to walk with her eyes closed and consider facing straight she is facing slightly off to the side. States she has noticed no difference herself. She has not had these tests done before the fall. No loss consciousness and fall. No fluid coming from ears or nose. Patient states she was told to come in here because the chiropractor thought she had a break somewhere. Patient had no neck pain either now or with the fall. She's had no back pain either. She's noticed no differences herself and her husband also noticed no weakness or difficulty doing things.  Past Medical History:  Diagnosis Date  . Accidental fall from chair   . Allergy    multiple food allergies  . Asthma    as a child  . Colitis 2012  . Coronary atherosclerosis    D/T lipid rich plaque  . GERD (gastroesophageal reflux disease)   . H. pylori infection 2012  . Heart murmur   . Hyperlipidemia   . Hypothyroidism   . Insomnia   . Internal hemorrhoids   . Mitral valve prolapse   . Osteoporosis   . Sarcoidosis ([place]) 1966  . TIA  (transient ischemic attack) 04/2005  . Visit for suture removal     Patient Active Problem List   Diagnosis Date Noted  . Viral URI with cough 02/11/2016  . Diarrhea 12/26/2015  . Black stool 12/26/2015  . Squamous cell carcinoma, leg 05/30/2015  . IBS (irritable bowel syndrome) 07/27/2014  . Carotid atherosclerosis 06/29/2014  . Medicare annual wellness visit, subsequent 05/11/2014  . Insomnia   . H. pylori infection 06/15/2012  . Osteoporosis 07/03/2011  . History of TIAs 06/02/2011  . Colitis 05/20/2011  . Hypothyroidism 03/18/2010  . GLUTEN ENTEROPATHY 03/18/2010  . HYPERLIPIDEMIA TYPE IIB / III 02/13/2009  . Mitral valve prolapse 02/02/2009    Past Surgical History:  Procedure Laterality Date  . ABDOMINAL HYSTERECTOMY  1972  . LYMPH NODE BIOPSY  1966  . TONSILLECTOMY      OB History    No data available       Home Medications    Prior to Admission medications   Medication Sig Start Date End Date Taking? Authorizing Provider  acidophilus (RISAQUAD) CAPS Take 1 capsule by mouth daily.    Historical Provider, MD  Alpha-Lipoic Acid 600 MG CAPS Take by mouth.    Historical Provider, MD  aspirin 81 MG tablet Take 81 mg  by mouth daily.      Historical Provider, MD  benzonatate (TESSALON) 200 MG capsule Take 1 capsule (200 mg total) by mouth 2 (two) times daily as needed for cough. 02/12/16   Amy Cletis Athens, MD  cholecalciferol (VITAMIN D) 1000 UNITS tablet Take 1,000 Units by mouth daily.     Historical Provider, MD  Coenzyme Q10 (COQ-10) 100 MG CAPS Take one by mouth daily    Historical Provider, MD  DHEA 10 MG CAPS Take one by mouth daily    Historical Provider, MD  Estradiol-Estriol-Progesterone (BIEST/PROGESTERONE) CREA Place 0.5 mLs onto the skin daily. 05/30/15   Lucille Passy, MD  ezetimibe (ZETIA) 10 MG tablet Take 1 tablet (10 mg total) by mouth daily. 07/02/15   Minna Merritts, MD  Folate-B12-Intrinsic Factor (INTRINSI B12-FOLATE) B4643994 MCG-MCG-MG TABS Take  1 tablet by mouth daily.     Historical Provider, MD  guaiFENesin-codeine (ROBITUSSIN AC) 100-10 MG/5ML syrup Take 5-10 mLs by mouth at bedtime as needed for cough. 02/11/16   Amy Cletis Athens, MD  NON FORMULARY Tumeric 300 mg take one tablet daily.    Historical Provider, MD  progesterone (PROMETRIUM) 100 MG capsule 150 mg daily 05/30/15   Lucille Passy, MD  Red Yeast Rice 600 MG CAPS 1 tab by mouth daily 09/20/14   Lucille Passy, MD  thyroid (ARMOUR) 15 MG tablet Take 1 tablet (15 mg total) by mouth daily. 11/29/15   Lucille Passy, MD    Family History Family History  Problem Relation Age of Onset  . Throat cancer Mother   . Alcohol abuse Brother     Social History Social History  Substance Use Topics  . Smoking status: Never Smoker  . Smokeless tobacco: Never Used  . Alcohol use Yes     Comment: 3-4 glasses of wine a week     Allergies   Clindamycin/lincomycin and Penicillins   Review of Systems Review of Systems  Constitutional: Negative for activity change, appetite change and fatigue.  Eyes: Negative for pain.  Respiratory: Negative for chest tightness and shortness of breath.   Cardiovascular: Negative for chest pain and leg swelling.  Gastrointestinal: Negative for abdominal pain, diarrhea, nausea and vomiting.  Genitourinary: Negative for flank pain.  Musculoskeletal: Negative for back pain, neck pain and neck stiffness.  Skin: Positive for wound. Negative for rash.  Neurological: Negative for weakness, numbness and headaches.  Psychiatric/Behavioral: Negative for behavioral problems.     Physical Exam Updated Vital Signs BP 141/66 (BP Location: Left Arm)   Pulse 66   Temp 98.1 F (36.7 C) (Oral)   Resp 16   Ht 5' 2.75" (1.594 m)   Wt 94 lb (42.6 kg)   SpO2 100%   BMI 16.78 kg/m   Physical Exam  Constitutional: She is oriented to person, place, and time. She appears well-developed and well-nourished.  HENT:  Bilateral mild lower periorbital ecchymosis. Slight  tenderness over the bridge of her nose also. No septal hematoma. Bilateral TMs normal.  Eyes: EOM are normal. Pupils are equal, round, and reactive to light.  Neck: Neck supple.  Cardiovascular: Normal rate.   Pulmonary/Chest: Effort normal.  Abdominal: Soft.  Musculoskeletal: Normal range of motion.  Mild abrasions to left ankle area and left knee area.  Neurological: She is alert and oriented to person, place, and time. No cranial nerve deficit. She exhibits normal muscle tone. Coordination normal.  Extraocular movements intact. Face symmetric. Good grip strength bilaterally. Good flexion-extension and extension  and bilateral shoulders elbows and wrists. Finger-nose intact bilaterally. Good radial medial and ulnar strength and hand. No Romberg. Good straight leg raise bilaterally. Normal gait. Forward and lateral backward. Heel shin normal bilaterally.  Skin: Skin is warm.     ED Treatments / Results  Labs (all labs ordered are listed, but only abnormal results are displayed) Labs Reviewed - No data to display  EKG  EKG Interpretation None       Radiology No results found.  Procedures Procedures (including critical care time)  Medications Ordered in ED Medications - No data to display   Initial Impression / Assessment and Plan / ED Course  I have reviewed the triage vital signs and the nursing notes.  Pertinent labs & imaging results that were available during my care of the patient were reviewed by me and considered in my medical decision making (see chart for details).  Clinical Course    Patient with fall 10 days ago. Sent in from chiropractor for her neurologic deficits. I found no neurologic deficits on her. Patient has not noted any deficits. No headaches. No neck pain. No back pain. The injury itself was 10 days ago. At this point I doubt there been a severe injury. Doubt fracture doubt ligamentous injury. Central cord syndrome felt less likely also with no  weakness in upper or lower extremities. At this point if there had been an injury to his likely stable but I did not see any deficits. She has plans to fly to [place] in 3 days think this would likely be safe. Discharge home.  Final Clinical Impressions(s) / ED Diagnoses   Final diagnoses:  Fall, initial encounter  Facial contusion, initial encounter    New Prescriptions New Prescriptions   No medications on file     Davonna Belling, MD 04/22/16 1541

## 2016-04-22 NOTE — Telephone Encounter (Signed)
°  Patient Name: Deanna Cobb  DOB: 01-17-1935    Initial Comment caller states wife fell and injured her face and doctor is concerned about weakness on one side of body   Nurse Assessment  Nurse: Julien Girt, RN, Almyra Free Date/Time Eilene Ghazi Time): 04/22/2016 11:25:01 AM  Confirm and document reason for call. If symptomatic, describe symptoms. You must click the next button to save text entered. ---Caller states his wife fell 8-10 days ago walking her dog. She had 2 black eyes, large cut on her lip, hit her nose and has abrasions on both knees. 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 There was no loss of consciousness. She has been driving and is actually gone to [place] now. Adds all her wounds/abrasions are healing. She saw the chiropractor today , was told she had "weakness on one side", and needs an evaluation. He is asking for advice. Caller declined triage. They are leaving to go out of the country on Friday and he needs to know where to take her today.  Has the patient traveled out of the country within the last 30 days? ---Not Applicable  Does the patient have any new or worsening symptoms? ---Yes  Will a triage be completed? ---No  Select reason for no triage. ---Patient declined  Please document clinical information provided and list any resource used. ---After talking with Dr. Keenan Bachelor, due to nature of injury, the existing and new sx his wife is having, I recommended ED outcome asap. He agreed, will go get her now and proceed to [facility] ED. Advised to cb as needed if sx worsen, he verbalized understanding.     Guidelines    Guideline Title Affirmed Question Affirmed Notes       Final Disposition User   Clinical Call Julien Girt, RN, Almyra Free

## 2016-04-22 NOTE — ED Triage Notes (Signed)
Pt reports tripped on their dog's leash last Saturday and fell.  Pt presents with ecchymosis under bila eyes.  Pt reports she landed on her face.  States she went to see her chiropractor this am and was told that he noted some neurological changes that he advised her to come to the ED.  Pt reports they are flying to [place] Friday and also wants to make sure that she is cleared to travelled.  Pt is A&Ox 4. Pt denies LOC after the fall.  Pt also denies dizziness or h/a or nausea at this time.

## 2016-04-22 NOTE — ED Notes (Signed)
Patient was alert, oriented and stable upon discharge. RN went over AVS and patient had no further questions.  

## 2016-05-13 DIAGNOSIS — M9903 Segmental and somatic dysfunction of lumbar region: Secondary | ICD-10-CM | POA: Diagnosis not present

## 2016-05-13 DIAGNOSIS — M4716 Other spondylosis with myelopathy, lumbar region: Secondary | ICD-10-CM | POA: Diagnosis not present

## 2016-05-13 DIAGNOSIS — M4715 Other spondylosis with myelopathy, thoracolumbar region: Secondary | ICD-10-CM | POA: Diagnosis not present

## 2016-05-13 DIAGNOSIS — M9905 Segmental and somatic dysfunction of pelvic region: Secondary | ICD-10-CM | POA: Diagnosis not present

## 2016-05-13 DIAGNOSIS — M9902 Segmental and somatic dysfunction of thoracic region: Secondary | ICD-10-CM | POA: Diagnosis not present

## 2016-05-13 DIAGNOSIS — M5431 Sciatica, right side: Secondary | ICD-10-CM | POA: Diagnosis not present

## 2016-05-27 ENCOUNTER — Other Ambulatory Visit: Payer: Self-pay | Admitting: Family Medicine

## 2016-06-02 ENCOUNTER — Encounter: Payer: Self-pay | Admitting: Family Medicine

## 2016-06-02 ENCOUNTER — Ambulatory Visit (INDEPENDENT_AMBULATORY_CARE_PROVIDER_SITE_OTHER): Payer: Medicare Other | Admitting: Family Medicine

## 2016-06-02 VITALS — BP 120/88 | HR 67 | Ht 62.5 in | Wt 95.8 lb

## 2016-06-02 DIAGNOSIS — Z0000 Encounter for general adult medical examination without abnormal findings: Secondary | ICD-10-CM

## 2016-06-02 DIAGNOSIS — E782 Mixed hyperlipidemia: Secondary | ICD-10-CM

## 2016-06-02 DIAGNOSIS — H9192 Unspecified hearing loss, left ear: Secondary | ICD-10-CM

## 2016-06-02 DIAGNOSIS — E039 Hypothyroidism, unspecified: Secondary | ICD-10-CM

## 2016-06-02 DIAGNOSIS — G4700 Insomnia, unspecified: Secondary | ICD-10-CM

## 2016-06-02 DIAGNOSIS — M810 Age-related osteoporosis without current pathological fracture: Secondary | ICD-10-CM

## 2016-06-02 DIAGNOSIS — Z23 Encounter for immunization: Secondary | ICD-10-CM

## 2016-06-02 LAB — CBC WITH DIFFERENTIAL/PLATELET
BASOS ABS: 0 10*3/uL (ref 0.0–0.1)
Basophils Relative: 0.6 % (ref 0.0–3.0)
EOS ABS: 0.1 10*3/uL (ref 0.0–0.7)
Eosinophils Relative: 1.8 % (ref 0.0–5.0)
HEMATOCRIT: 44.2 % (ref 36.0–46.0)
HEMOGLOBIN: 14.9 g/dL (ref 12.0–15.0)
LYMPHS PCT: 35.1 % (ref 12.0–46.0)
Lymphs Abs: 2.2 10*3/uL (ref 0.7–4.0)
MCHC: 33.7 g/dL (ref 30.0–36.0)
MCV: 95.7 fl (ref 78.0–100.0)
Monocytes Absolute: 0.5 10*3/uL (ref 0.1–1.0)
Monocytes Relative: 8.4 % (ref 3.0–12.0)
Neutro Abs: 3.4 10*3/uL (ref 1.4–7.7)
Neutrophils Relative %: 54.1 % (ref 43.0–77.0)
Platelets: 248 10*3/uL (ref 150.0–400.0)
RBC: 4.62 Mil/uL (ref 3.87–5.11)
RDW: 13.8 % (ref 11.5–15.5)
WBC: 6.4 10*3/uL (ref 4.0–10.5)

## 2016-06-02 LAB — C-REACTIVE PROTEIN: CRP: 0.1 mg/dL — AB (ref 0.5–20.0)

## 2016-06-02 LAB — COMPREHENSIVE METABOLIC PANEL
ALBUMIN: 4.1 g/dL (ref 3.5–5.2)
ALT: 14 U/L (ref 0–35)
AST: 19 U/L (ref 0–37)
Alkaline Phosphatase: 44 U/L (ref 39–117)
BILIRUBIN TOTAL: 1.3 mg/dL — AB (ref 0.2–1.2)
BUN: 14 mg/dL (ref 6–23)
CALCIUM: 8.8 mg/dL (ref 8.4–10.5)
CO2: 31 meq/L (ref 19–32)
CREATININE: 0.75 mg/dL (ref 0.40–1.20)
Chloride: 102 mEq/L (ref 96–112)
GFR: 79.1 mL/min (ref >60.00)
Glucose, Bld: 92 mg/dL (ref 70–99)
Potassium: 4.2 mEq/L (ref 3.5–5.1)
Sodium: 139 mEq/L (ref 135–145)
Total Protein: 6.6 g/dL (ref 6.0–8.3)

## 2016-06-02 LAB — LIPID PANEL
CHOL/HDL RATIO: 3
Cholesterol: 212 mg/dL — ABNORMAL HIGH (ref 0–200)
HDL: 78.9 mg/dL (ref >39.00)
LDL Cholesterol: 120 mg/dL — ABNORMAL HIGH (ref 0–99)
NonHDL: 133.56
TRIGLYCERIDES: 68 mg/dL (ref 0.0–149.0)
VLDL: 13.6 mg/dL (ref 0.0–40.0)

## 2016-06-02 LAB — TSH: TSH: 3.22 u[IU]/mL (ref 0.35–4.50)

## 2016-06-02 LAB — VITAMIN D 25 HYDROXY (VIT D DEFICIENCY, FRACTURES): VITD: 64.15 ng/mL (ref 30.00–100.00)

## 2016-06-02 MED ORDER — THYROID 15 MG PO TABS: 15.0000 mg | ORAL_TABLET | Freq: Every day | ORAL | 3 refills | Status: DC

## 2016-06-02 NOTE — Assessment & Plan Note (Signed)
New- refer to audiology.  Prefers to go to audiologist associated with her ENT, Dr. Pryor Ochoa.

## 2016-06-02 NOTE — Assessment & Plan Note (Signed)
Continue prolia. Due for dexa. Order entered.

## 2016-06-02 NOTE — Assessment & Plan Note (Signed)
The patients weight, height, BMI and visual acuity have been recorded in the chart.  Cognitive function assessed.   I have made referrals, counseling and provided education to the patient based review of the above and I have provided the pt with a written personalized care plan for preventive services.  

## 2016-06-02 NOTE — Progress Notes (Signed)
HPI Deanna Cobb is a very pleasant 81 yo female here today for medicare annual wellness visit and follow up of chronic medical conditions.  I have personally reviewed the Medicare Annual Wellness questionnaire and have noted 1. The patient's medical and social history 2. Their use of alcohol, tobacco or illicit drugs 3. Their current medications and supplements 4. The patient's functional ability including ADL's, fall risks, home safety risks and hearing or visual             impairment. 5. Diet and physical activities 6. Evidence for depression or mood disorders  Zoster 03/16/08 Td 03/29/09 Pneumovax 03/11/09 Prevnar 13 05/11/14 Mammogram 07/31/15 Colonoscopy- 01/03/16  End of life wishes discussed and updated in Social History.  The roster of all physicians providing medical care to patient - is listed in the Snapshot section of the chart.  Doing well.  Did have a fall last month but tripped over a curb in flip flops.  Went to ER.  Notes reviewed.  Was followed by Dr. Verl Blalock for management of her history of a TIA , mitral valve prolapse, and hyperlipidemia. Taking low-dose aspirin.  She stopped taking Crestor because she felt it was making her "mind cloudy.  Taking red yeast rice.  Sees Dr. Rockey Situ- has follow up next month.   Wt Readings from Last 3 Encounters:  06/02/16 95 lb 12.8 oz (43.5 kg)  04/22/16 94 lb (42.6 kg)  02/11/16 93 lb 4 oz (42.3 kg)    Lab Results  Component Value Date   CHOL 204 (H) 05/21/2015   HDL 72.20 05/21/2015   LDLCALC 120 (H) 05/21/2015   LDLDIRECT 109.0 06/15/2012   TRIG 62.0 05/21/2015   CHOLHDL 3 05/21/2015    Hypothyroidism- on armour.  Denies symptoms of hypo or hyperthyroidism.  Sees a "holistic doctor" who checks her thyroid.   Lab Results  Component Value Date   TSH 2.58 12/26/2015    Osteoporosis- DEXA 05/22/14.  Has been receiving prolia.  Last injection 02/19/16.    Past Medical History:  Diagnosis Date  . Accidental fall from chair    . Allergy    multiple food allergies  . Asthma    as a child  . Colitis 2012  . Coronary atherosclerosis    D/T lipid rich plaque  . GERD (gastroesophageal reflux disease)   . H. pylori infection 2012  . Heart murmur   . Hyperlipidemia   . Hypothyroidism   . Insomnia   . Internal hemorrhoids   . Mitral valve prolapse   . Osteoporosis   . Sarcoidosis ([place]) 1966  . TIA (transient ischemic attack) 04/2005  . Visit for suture removal     Past Surgical History:  Procedure Laterality Date  . ABDOMINAL HYSTERECTOMY  1972  . LYMPH NODE BIOPSY  1966  . TONSILLECTOMY      Family History  Problem Relation Age of Onset  . Throat cancer Mother   . Alcohol abuse Brother     Social History   Social History  . Marital status: Married    Spouse name: N/A  . Number of children: 4  . Years of education: N/A   Occupational History  . Retired-RN    Social History Main Topics  . Smoking status: Never Smoker  . Smokeless tobacco: Never Used  . Alcohol use Yes     Comment: 3-4 glasses of wine a week  . Drug use: No  . Sexual activity: Not on file   Other Topics Concern  .  Not on file   Social History Narrative   Does have a living will.   Would desire CPR but would not want prolonged life support.    Allergies  Allergen Reactions  . Clindamycin/Lincomycin   . Penicillins     REACTION: hives    Current Outpatient Prescriptions  Medication Sig Dispense Refill  . acidophilus (RISAQUAD) CAPS Take 1 capsule by mouth daily.    . Alpha-Lipoic Acid 600 MG CAPS Take by mouth.    Francia Greaves THYROID 15 MG tablet TAKE 1 TABLET (15 MG TOTAL) BY MOUTH DAILY. 90 tablet 1  . aspirin 81 MG tablet Take 81 mg by mouth daily.      . cholecalciferol (VITAMIN D) 1000 UNITS tablet Take 1,000 Units by mouth daily.     . Coenzyme Q10 (COQ-10) 100 MG CAPS Take one by mouth daily    . DHEA 10 MG CAPS Take one by mouth daily    . Folate-B12-Intrinsic Factor (INTRINSI B12-FOLATE) [id number]  MCG-MCG-MG TABS Take 1 tablet by mouth daily.     . Multiple Vitamins-Minerals (RA VISION-VITE PRESERVE PO) Take by mouth.    . NON FORMULARY Tumeric 300 mg take one tablet daily.    . Red Yeast Rice 600 MG CAPS 1 tab by mouth daily     No current facility-administered medications for this visit.     Review of Systems  Constitutional: Negative.   HENT: Positive for hearing loss.   Eyes: Negative.   Respiratory: Negative.   Cardiovascular: Negative.   Gastrointestinal: Negative.   Endocrine: Negative.   Genitourinary: Negative.   Musculoskeletal: Negative for back pain.  Allergic/Immunologic: Negative.   Neurological: Negative.   Hematological: Negative.   Psychiatric/Behavioral: Negative.   All other systems reviewed and are negative.     Physical exam  BP 120/88   Pulse 67   Ht 5' 2.5" (1.588 m)   Wt 95 lb 12.8 oz (43.5 kg)   SpO2 98%   BMI 17.24 kg/m  Physical Exam  Constitutional: She is oriented to person, place, and time and well-developed, well-nourished, and in no distress. No distress.  HENT:  Head: Normocephalic and atraumatic.  Eyes: Conjunctivae are normal.  Neck: Normal range of motion. Neck supple.  Cardiovascular: Normal rate, regular rhythm and normal heart sounds.   Pulmonary/Chest: Effort normal and breath sounds normal.  Abdominal: Soft. Bowel sounds are normal.  Musculoskeletal: Normal range of motion. She exhibits no edema or tenderness.  Neurological: She is alert and oriented to person, place, and time.  Skin: Skin is warm and dry. She is not diaphoretic.  Psychiatric: Mood, memory, affect and judgment normal.  Nursing note and vitals reviewed.

## 2016-06-02 NOTE — Assessment & Plan Note (Signed)
Due for labs today. 

## 2016-06-02 NOTE — Patient Instructions (Addendum)
Great to see you. Please call breast center to schedule your bone density.  We will call you with your lab results and you can view them online.  We will call you with an audiology appointment as well.

## 2016-06-02 NOTE — Assessment & Plan Note (Signed)
Due for labs. No changes made to rxs today. 

## 2016-06-10 DIAGNOSIS — M9903 Segmental and somatic dysfunction of lumbar region: Secondary | ICD-10-CM | POA: Diagnosis not present

## 2016-06-10 DIAGNOSIS — M4716 Other spondylosis with myelopathy, lumbar region: Secondary | ICD-10-CM | POA: Diagnosis not present

## 2016-06-10 DIAGNOSIS — M9902 Segmental and somatic dysfunction of thoracic region: Secondary | ICD-10-CM | POA: Diagnosis not present

## 2016-06-10 DIAGNOSIS — M4715 Other spondylosis with myelopathy, thoracolumbar region: Secondary | ICD-10-CM | POA: Diagnosis not present

## 2016-06-10 DIAGNOSIS — M9905 Segmental and somatic dysfunction of pelvic region: Secondary | ICD-10-CM | POA: Diagnosis not present

## 2016-06-10 DIAGNOSIS — M5431 Sciatica, right side: Secondary | ICD-10-CM | POA: Diagnosis not present

## 2016-06-13 ENCOUNTER — Other Ambulatory Visit: Payer: Self-pay | Admitting: Family Medicine

## 2016-06-13 DIAGNOSIS — M810 Age-related osteoporosis without current pathological fracture: Secondary | ICD-10-CM

## 2016-06-17 ENCOUNTER — Other Ambulatory Visit: Payer: Medicare Other

## 2016-06-17 ENCOUNTER — Ambulatory Visit
Admission: RE | Admit: 2016-06-17 | Discharge: 2016-06-17 | Disposition: A | Discharge status: Home / Self Care | Payer: Medicare Other | Source: Ambulatory Visit | Attending: Family Medicine | Admitting: Family Medicine

## 2016-06-17 DIAGNOSIS — M4715 Other spondylosis with myelopathy, thoracolumbar region: Secondary | ICD-10-CM | POA: Diagnosis not present

## 2016-06-17 DIAGNOSIS — M9905 Segmental and somatic dysfunction of pelvic region: Secondary | ICD-10-CM | POA: Diagnosis not present

## 2016-06-17 DIAGNOSIS — M9903 Segmental and somatic dysfunction of lumbar region: Secondary | ICD-10-CM | POA: Diagnosis not present

## 2016-06-17 DIAGNOSIS — M5431 Sciatica, right side: Secondary | ICD-10-CM | POA: Diagnosis not present

## 2016-06-17 DIAGNOSIS — M9902 Segmental and somatic dysfunction of thoracic region: Secondary | ICD-10-CM | POA: Diagnosis not present

## 2016-06-17 DIAGNOSIS — M810 Age-related osteoporosis without current pathological fracture: Secondary | ICD-10-CM

## 2016-06-17 DIAGNOSIS — H90A32 Mixed conductive and sensorineural hearing loss, unilateral, left ear with restricted hearing on the contralateral side: Secondary | ICD-10-CM | POA: Diagnosis not present

## 2016-06-17 DIAGNOSIS — M4716 Other spondylosis with myelopathy, lumbar region: Secondary | ICD-10-CM | POA: Diagnosis not present

## 2016-06-17 DIAGNOSIS — Z780 Asymptomatic menopausal state: Secondary | ICD-10-CM | POA: Diagnosis not present

## 2016-06-17 DIAGNOSIS — H6982 Other specified disorders of Eustachian tube, left ear: Secondary | ICD-10-CM | POA: Diagnosis not present

## 2016-07-01 DIAGNOSIS — M9905 Segmental and somatic dysfunction of pelvic region: Secondary | ICD-10-CM | POA: Diagnosis not present

## 2016-07-01 DIAGNOSIS — M9903 Segmental and somatic dysfunction of lumbar region: Secondary | ICD-10-CM | POA: Diagnosis not present

## 2016-07-01 DIAGNOSIS — M4715 Other spondylosis with myelopathy, thoracolumbar region: Secondary | ICD-10-CM | POA: Diagnosis not present

## 2016-07-01 DIAGNOSIS — M4716 Other spondylosis with myelopathy, lumbar region: Secondary | ICD-10-CM | POA: Diagnosis not present

## 2016-07-01 DIAGNOSIS — M5431 Sciatica, right side: Secondary | ICD-10-CM | POA: Diagnosis not present

## 2016-07-01 DIAGNOSIS — M9902 Segmental and somatic dysfunction of thoracic region: Secondary | ICD-10-CM | POA: Diagnosis not present

## 2016-07-02 ENCOUNTER — Ambulatory Visit (INDEPENDENT_AMBULATORY_CARE_PROVIDER_SITE_OTHER): Payer: Medicare Other | Admitting: Cardiovascular Disease

## 2016-07-02 ENCOUNTER — Encounter: Payer: Self-pay | Admitting: Cardiovascular Disease

## 2016-07-02 VITALS — BP 130/62 | HR 59 | Ht 62.5 in | Wt 95.8 lb

## 2016-07-02 DIAGNOSIS — E782 Mixed hyperlipidemia: Secondary | ICD-10-CM

## 2016-07-02 DIAGNOSIS — I341 Nonrheumatic mitral (valve) prolapse: Secondary | ICD-10-CM | POA: Diagnosis not present

## 2016-07-02 DIAGNOSIS — I6523 Occlusion and stenosis of bilateral carotid arteries: Secondary | ICD-10-CM | POA: Diagnosis not present

## 2016-07-02 NOTE — Patient Instructions (Addendum)
Medication Instructions:   No medication changes made  Labwork:  No new labs needed  Testing/Procedures:  No further testing at this time  Research PFO (patent foramen ovale)   Follow-Up: It was a pleasure seeing you in the office today. Please call us if you have new issues that need to be addressed before your next appt.  [phone]  Your physician wants you to follow-up in: 12 months.  You will receive a reminder letter in the mail two months in advance. If you don't receive a letter, please call our office to schedule the follow-up appointment.  If you need a refill on your cardiac medications before your next appointment, please call your pharmacy.

## 2016-07-02 NOTE — Progress Notes (Signed)
Cardiology Office Note  Date:  07/02/2016   ID:  Deanna Cobb, DOB [birth year], MRN [id number]  PCP:  Arnette Norris, MD   Chief Complaint  Patient presents with  . other    1 yr f/u no complaints today. Meds reviewed verbally with pt.    HPI:  Ms. Mueck is a pleasant 81 year old woman with history of TIA in 2006, workup at that time showing mild atherosclerosis of the carotids bilaterally,  echocardiogram in 2006 suggesting borderline mitral valve prolapse, trace MR, normal ejection fraction who presents for routine follow-up of her carotid disease and hyperlipidemia  Carotid ultrasound done November 2016 showing no significant stenosis, only intimal thickening  Lab work reviewed CRP 0.1, total cholesterol greater than 200  CT scan abdomen from 2012 reviewed with her details, images pulled up in the office one spot with minimal CA athero, ostial left common iliac artery otherwise no significant disease  In general she has been doing well, feels well, no symptoms of chest pain or shortness of breath Active, weight is stable. Currently on no prescription medications.  EKG on today's visit shows normal sinus rhythm with rate 59 bpm, APC, no other significant ST or T-wave changes  PMH:   has a past medical history of Accidental fall from chair; Allergy; Asthma; Colitis (2012); Coronary atherosclerosis; GERD (gastroesophageal reflux disease); H. pylori infection (2012); Heart murmur; Hyperlipidemia; Hypothyroidism; Insomnia; Internal hemorrhoids; Mitral valve prolapse; Osteoporosis; Sarcoidosis ([place]) (1966); TIA (transient ischemic attack) (04/2005); and Visit for suture removal.  PSH:    Past Surgical History:  Procedure Laterality Date  . ABDOMINAL HYSTERECTOMY  1972  . LYMPH NODE BIOPSY  1966  . TONSILLECTOMY      Current Outpatient Prescriptions  Medication Sig Dispense Refill  . acidophilus (RISAQUAD) CAPS Take 1 capsule by mouth daily.    . Alpha-Lipoic Acid 600 MG CAPS Take by  mouth.    Marland Kitchen aspirin 81 MG tablet Take 81 mg by mouth daily.      . cholecalciferol (VITAMIN D) 1000 UNITS tablet Take 1,000 Units by mouth daily.     . Coenzyme Q10 (COQ-10) 100 MG CAPS Take one by mouth daily    . DHEA 10 MG CAPS Take one by mouth daily    . Folate-B12-Intrinsic Factor (INTRINSI B12-FOLATE) [id number] MCG-MCG-MG TABS Take 1 tablet by mouth daily.     . Multiple Vitamins-Minerals (RA VISION-VITE PRESERVE PO) Take by mouth.    . NON FORMULARY Tumeric 300 mg take one tablet daily.    Marland Kitchen thyroid (ARMOUR THYROID) 15 MG tablet Take 1 tablet (15 mg total) by mouth daily. 90 tablet 3  . TURMERIC PO Take by mouth daily.     No current facility-administered medications for this visit.      Allergies:   Clindamycin/lincomycin and Penicillins   Social History:  The patient  reports that she has never smoked. She has never used smokeless tobacco. She reports that she drinks alcohol. She reports that she does not use drugs.   Family History:   family history includes Alcohol abuse in her brother; Throat cancer in her mother.    Review of Systems: Review of Systems  Constitutional: Negative.   Respiratory: Negative.   Cardiovascular: Negative.   Gastrointestinal: Negative.   Musculoskeletal: Negative.   Neurological: Negative.   Psychiatric/Behavioral: Negative.   All other systems reviewed and are negative.    PHYSICAL EXAM: VS:  BP 130/62 (BP Location: Left Arm, Patient Position: Sitting, Cuff Size: Normal)  Pulse (!) 59   Ht 5' 2.5" (1.588 m)   Wt 95 lb 12 oz (43.4 kg)   BMI 17.23 kg/m  , BMI Body mass index is 17.23 kg/m. GEN: Well nourished, well developed, in no acute distress  HEENT: normal  Neck: no JVD, carotid bruits, or masses Cardiac: RRR; no murmurs, rubs, or gallops,no edema  Respiratory:  clear to auscultation bilaterally, normal work of breathing GI: soft, nontender, nondistended, + BS MS: no deformity or atrophy  Skin: warm and dry, no  rash Neuro:  Strength and sensation are intact Psych: euthymic mood, full affect    Recent Labs: 06/02/2016: ALT 14; BUN 14; Creatinine, Ser 0.75; Hemoglobin 14.9; Platelets 248.0; Potassium 4.2; Sodium 139; TSH 3.22    Lipid Panel Lab Results  Component Value Date   CHOL 212 (H) 06/02/2016   HDL 78.90 06/02/2016   LDLCALC 120 (H) 06/02/2016   TRIG 68.0 06/02/2016      Wt Readings from Last 3 Encounters:  07/02/16 95 lb 12 oz (43.4 kg)  06/02/16 95 lb 12.8 oz (43.5 kg)  04/22/16 94 lb (42.6 kg)       ASSESSMENT AND PLAN:  HYPERLIPIDEMIA TYPE IIB / III Cholesterol mildly elevated but given no significant carotid or aortic atherosclerosis on ultrasound and CT scan, we'll not treat aggressively at this time  Atherosclerosis of both carotid arteries Intimal thickening No need for repeat scan   Total encounter time more than 15 minutes  Greater than 50% was spent in counseling and coordination of care with the patient   Disposition:   F/U  12 months   Orders Placed This Encounter  Procedures  . EKG 12-Lead     Signed, Esmond Plants, M.D., Ph.D. 07/02/2016  [place], [place]

## 2016-07-03 DIAGNOSIS — Z85828 Personal history of other malignant neoplasm of skin: Secondary | ICD-10-CM | POA: Diagnosis not present

## 2016-07-03 DIAGNOSIS — X32XXXA Exposure to sunlight, initial encounter: Secondary | ICD-10-CM | POA: Diagnosis not present

## 2016-07-03 DIAGNOSIS — L570 Actinic keratosis: Secondary | ICD-10-CM | POA: Diagnosis not present

## 2016-07-03 DIAGNOSIS — L821 Other seborrheic keratosis: Secondary | ICD-10-CM | POA: Diagnosis not present

## 2016-07-03 DIAGNOSIS — Z08 Encounter for follow-up examination after completed treatment for malignant neoplasm: Secondary | ICD-10-CM | POA: Diagnosis not present

## 2016-07-14 ENCOUNTER — Telehealth: Payer: Self-pay | Admitting: Family Medicine

## 2016-07-14 NOTE — Telephone Encounter (Signed)
I have electronically submitted pt's info for Prol[employer] insurance verification and will notify you once I have a response. Thank you. °

## 2016-07-14 NOTE — Telephone Encounter (Signed)
Pt has prolia appt, pt is calling ahead so that it can be ordered Thanks  No call back needed

## 2016-07-15 DIAGNOSIS — M9903 Segmental and somatic dysfunction of lumbar region: Secondary | ICD-10-CM | POA: Diagnosis not present

## 2016-07-15 DIAGNOSIS — M4716 Other spondylosis with myelopathy, lumbar region: Secondary | ICD-10-CM | POA: Diagnosis not present

## 2016-07-15 DIAGNOSIS — M5431 Sciatica, right side: Secondary | ICD-10-CM | POA: Diagnosis not present

## 2016-07-15 DIAGNOSIS — M9902 Segmental and somatic dysfunction of thoracic region: Secondary | ICD-10-CM | POA: Diagnosis not present

## 2016-07-21 DIAGNOSIS — H90A32 Mixed conductive and sensorineural hearing loss, unilateral, left ear with restricted hearing on the contralateral side: Secondary | ICD-10-CM | POA: Diagnosis not present

## 2016-07-21 DIAGNOSIS — H8012 Otosclerosis involving oval window, obliterative, left ear: Secondary | ICD-10-CM | POA: Diagnosis not present

## 2016-07-24 DIAGNOSIS — M9905 Segmental and somatic dysfunction of pelvic region: Secondary | ICD-10-CM | POA: Diagnosis not present

## 2016-07-24 DIAGNOSIS — M9902 Segmental and somatic dysfunction of thoracic region: Secondary | ICD-10-CM | POA: Diagnosis not present

## 2016-07-24 DIAGNOSIS — M9903 Segmental and somatic dysfunction of lumbar region: Secondary | ICD-10-CM | POA: Diagnosis not present

## 2016-07-29 DIAGNOSIS — M9902 Segmental and somatic dysfunction of thoracic region: Secondary | ICD-10-CM | POA: Diagnosis not present

## 2016-07-29 DIAGNOSIS — M9905 Segmental and somatic dysfunction of pelvic region: Secondary | ICD-10-CM | POA: Diagnosis not present

## 2016-07-29 DIAGNOSIS — M9903 Segmental and somatic dysfunction of lumbar region: Secondary | ICD-10-CM | POA: Diagnosis not present

## 2016-08-12 DIAGNOSIS — M9905 Segmental and somatic dysfunction of pelvic region: Secondary | ICD-10-CM | POA: Diagnosis not present

## 2016-08-12 DIAGNOSIS — M9902 Segmental and somatic dysfunction of thoracic region: Secondary | ICD-10-CM | POA: Diagnosis not present

## 2016-08-12 DIAGNOSIS — M9903 Segmental and somatic dysfunction of lumbar region: Secondary | ICD-10-CM | POA: Diagnosis not present

## 2016-08-21 ENCOUNTER — Ambulatory Visit: Payer: Medicare Other

## 2016-08-21 NOTE — Telephone Encounter (Signed)
Pt said no one called her to let her know ins verification was done and prolia ordered; pt had scheduled this appt in June. Please check status. Pt request cb and to reschedule prolia injection.

## 2016-08-22 ENCOUNTER — Other Ambulatory Visit (INDEPENDENT_AMBULATORY_CARE_PROVIDER_SITE_OTHER): Payer: Medicare Other

## 2016-08-22 DIAGNOSIS — M810 Age-related osteoporosis without current pathological fracture: Secondary | ICD-10-CM

## 2016-08-22 LAB — CALCIUM: CALCIUM: 9.1 mg/dL (ref 8.4–10.5)

## 2016-08-22 NOTE — Telephone Encounter (Signed)
Deanna Cobb, this is one that I believe is in the book I tried to review w/you on Monday.  I just checked online and it appears that the summary of benefit is one of those "instant" summaries that I explained to you have been wrong in the past and you need to contact [employer] directly to ask them to run a manual verification.   Deanna Cobb has reached out to [name] to get him in there, hopefully soon.    Unfortunately, [name] showed up for an appointment and had to be turned away.  Could you please contact [employer] today to request a manual verification and ask them to place high priority on it so they can get it faxed over to you as quickly as possible.   In the meantime, I will try to get in touch w/[name] and inquire about the instant summaries of benefit so he can be looking into it prior to coming in to meet w/you and [name].  If you have any questions, please let me know.  Thank you.

## 2016-08-22 NOTE — Telephone Encounter (Signed)
Spoke to pt and advised.Ca lab scheduled.

## 2016-08-22 NOTE — Telephone Encounter (Signed)
Per [employer]'s summary of benefits, [name] estimated responsibility will be $0.  Please make her aware this is only estimate and we will not know the exact amt until both insurances have paid.    Deanna Cobb, the summary of benefits should be clipped to her card in the green notebook as we discussed.  Could you please get it and send to scan?  If you have any questions let me know.  Thank you.

## 2016-08-25 DIAGNOSIS — M9902 Segmental and somatic dysfunction of thoracic region: Secondary | ICD-10-CM | POA: Diagnosis not present

## 2016-08-25 DIAGNOSIS — M9903 Segmental and somatic dysfunction of lumbar region: Secondary | ICD-10-CM | POA: Diagnosis not present

## 2016-08-25 DIAGNOSIS — M9905 Segmental and somatic dysfunction of pelvic region: Secondary | ICD-10-CM | POA: Diagnosis not present

## 2016-08-25 NOTE — Telephone Encounter (Signed)
Spoke to pt and scheduled prolia injection. 

## 2016-08-27 ENCOUNTER — Ambulatory Visit (INDEPENDENT_AMBULATORY_CARE_PROVIDER_SITE_OTHER): Payer: Medicare Other | Admitting: *Deleted

## 2016-08-27 DIAGNOSIS — M810 Age-related osteoporosis without current pathological fracture: Secondary | ICD-10-CM | POA: Diagnosis not present

## 2016-08-27 MED ORDER — DENOSUMAB 60 MG/ML ~~LOC~~ SOLN
60.0000 mg | SUBCUTANEOUS | Status: DC
  Administered 2016-08-27: 60 mg via SUBCUTANEOUS

## 2016-09-16 ENCOUNTER — Other Ambulatory Visit: Payer: Self-pay | Admitting: Family Medicine

## 2016-09-16 DIAGNOSIS — Z1231 Encounter for screening mammogram for malignant neoplasm of breast: Secondary | ICD-10-CM

## 2016-09-17 ENCOUNTER — Ambulatory Visit
Admission: RE | Admit: 2016-09-17 | Discharge: 2016-09-17 | Disposition: A | Discharge status: Home / Self Care | Payer: Medicare Other | Source: Ambulatory Visit | Attending: Family Medicine | Admitting: Family Medicine

## 2016-09-17 DIAGNOSIS — Z1231 Encounter for screening mammogram for malignant neoplasm of breast: Secondary | ICD-10-CM | POA: Diagnosis not present

## 2016-10-09 DIAGNOSIS — M9905 Segmental and somatic dysfunction of pelvic region: Secondary | ICD-10-CM | POA: Diagnosis not present

## 2016-10-09 DIAGNOSIS — M9902 Segmental and somatic dysfunction of thoracic region: Secondary | ICD-10-CM | POA: Diagnosis not present

## 2016-10-09 DIAGNOSIS — M9903 Segmental and somatic dysfunction of lumbar region: Secondary | ICD-10-CM | POA: Diagnosis not present

## 2016-10-14 DIAGNOSIS — M9905 Segmental and somatic dysfunction of pelvic region: Secondary | ICD-10-CM | POA: Diagnosis not present

## 2016-10-14 DIAGNOSIS — M9902 Segmental and somatic dysfunction of thoracic region: Secondary | ICD-10-CM | POA: Diagnosis not present

## 2016-10-14 DIAGNOSIS — M9903 Segmental and somatic dysfunction of lumbar region: Secondary | ICD-10-CM | POA: Diagnosis not present

## 2016-10-20 ENCOUNTER — Encounter: Payer: Self-pay | Admitting: Podiatry

## 2016-10-20 ENCOUNTER — Ambulatory Visit (INDEPENDENT_AMBULATORY_CARE_PROVIDER_SITE_OTHER): Payer: Medicare Other | Admitting: Podiatry

## 2016-10-20 ENCOUNTER — Other Ambulatory Visit: Payer: Self-pay | Admitting: *Deleted

## 2016-10-20 DIAGNOSIS — L600 Ingrowing nail: Secondary | ICD-10-CM | POA: Diagnosis not present

## 2016-10-20 MED ORDER — NEOMYCIN-POLYMYXIN-HC 1 % OT SOLN: OTIC | 1 refills | Status: DC

## 2016-10-20 NOTE — Progress Notes (Signed)
   Subjective:    Patient ID: Deanna Cobb, female    DOB: June 20, 1935, 82 y.o.   MRN: [id number]  HPI: She presents today with chief complaint of a painful ingrown toenail to the hallux left tibial border. She states that is involving me for many months now.    Review of Systems  All other systems reviewed and are negative.      Objective:   Physical Exam: Vital signs are stable alert and oriented 3. Pulses are strongly palpable. Neurologic sensorium is intact. Deep tendon reflexes are intact. Muscle strength 5 over 5 dorsiflexion plantar flexors and inverters everters onto the musculature is intact. Orthopedic evaluation of his returns all joints distal to the ankle range of motion without crepitation. Cutaneous evaluation of straight supple acute sharp incurvated nail margins with mild erythema along the tibial border of the hallux left.          Assessment & Plan:  Assessment: Ingrown nail tibial border hallux left.  Plan: Discussed etiology pathology conservative or surgical therapies. At this point performed a chemical matricectomy to the tibial border. This was performed after local anesthesia was diminished. She tolerated the procedure well. She was provided with both oral and then home-going instructions for care and soaking of her toe as well as a prescription for Cortisporin Otic to be applied twice daily soaking.

## 2016-10-20 NOTE — Patient Instructions (Signed)

## 2016-10-30 ENCOUNTER — Ambulatory Visit (INDEPENDENT_AMBULATORY_CARE_PROVIDER_SITE_OTHER): Payer: Self-pay | Admitting: Podiatry

## 2016-10-30 ENCOUNTER — Telehealth: Payer: Self-pay

## 2016-10-30 ENCOUNTER — Encounter: Payer: Self-pay | Admitting: Podiatry

## 2016-10-30 DIAGNOSIS — L600 Ingrowing nail: Secondary | ICD-10-CM

## 2016-10-30 NOTE — Patient Instructions (Signed)

## 2016-10-30 NOTE — Telephone Encounter (Signed)
Pt left v/m to get 90 day rx for thyroid to [place]. Should be that way already. I called [facility] and spoke with [facility] . Ins will only allow 30 day fill. Pt voiced understanding.

## 2016-10-30 NOTE — Progress Notes (Signed)
She presents today for follow-up of matrixectomy to the hallux nail left. She states that is doing just great I have no problems. She's been soaking it.  Objective: No erythema edema cellulitis drainage or odor she states she is doing quite well. No pain on palpation.  Assessment: Well-healed matricectomy hallux left.  Plan: Discontinue Betadine spelled Epsom salts and warm water soaks covered in the daytime and leave open at bedtime. Continue sick for the next week until there is no soreness on palpation. She watched signs symptoms of infection if she notifies V of these we will see her immediately.

## 2016-11-03 ENCOUNTER — Ambulatory Visit: Payer: Self-pay | Admitting: Podiatry

## 2016-11-20 DIAGNOSIS — M9902 Segmental and somatic dysfunction of thoracic region: Secondary | ICD-10-CM | POA: Diagnosis not present

## 2016-11-20 DIAGNOSIS — M9903 Segmental and somatic dysfunction of lumbar region: Secondary | ICD-10-CM | POA: Diagnosis not present

## 2016-11-20 DIAGNOSIS — M9905 Segmental and somatic dysfunction of pelvic region: Secondary | ICD-10-CM | POA: Diagnosis not present

## 2016-12-01 ENCOUNTER — Encounter: Payer: Self-pay | Admitting: Family Medicine

## 2016-12-01 ENCOUNTER — Encounter (INDEPENDENT_AMBULATORY_CARE_PROVIDER_SITE_OTHER): Payer: Self-pay

## 2016-12-01 ENCOUNTER — Ambulatory Visit (INDEPENDENT_AMBULATORY_CARE_PROVIDER_SITE_OTHER): Payer: Medicare Other | Admitting: Family Medicine

## 2016-12-01 VITALS — BP 122/86 | HR 74 | Temp 97.4°F | Ht 62.5 in | Wt 96.0 lb

## 2016-12-01 DIAGNOSIS — K589 Irritable bowel syndrome without diarrhea: Secondary | ICD-10-CM

## 2016-12-01 DIAGNOSIS — R109 Unspecified abdominal pain: Secondary | ICD-10-CM | POA: Insufficient documentation

## 2016-12-01 NOTE — Progress Notes (Signed)
Subjective:   Patient ID: [name], female    DOB: 28-Oct-1934, 82 y.o.   MRN: [id number]  NYIA TSAO is a pleasant 82 y.o. year old female who presents to clinic today with Abdominal Pain (pt stated lower left side of the stomach pain for about 1 week.)  on 12/01/2016  HPI:  Last week, acute onset of lower left sided abdominal pain with bloating and diarrhea.  No fever or blood in her stool.  Felt different than her typical IBS flares.  She is asking for nutritionist referral for IBS diet.  Colonoscopy 01/03/16- reviewed. Multiple small diverticula, otherwise unremarkable.  Her abdominal pain has resolved.  Still has some loose stools but she feels this is her baseline.  Current Outpatient Prescriptions on File Prior to Visit  Medication Sig Dispense Refill  . acidophilus (RISAQUAD) CAPS Take 1 capsule by mouth daily.    . Alpha-Lipoic Acid 600 MG CAPS Take by mouth.    Marland Kitchen aspirin 81 MG tablet Take 81 mg by mouth daily.      . cholecalciferol (VITAMIN D) 1000 UNITS tablet Take 1,000 Units by mouth daily.     . Coenzyme Q10 (COQ-10) 100 MG CAPS Take one by mouth daily    . DHEA 10 MG CAPS Take one by mouth daily    . Folate-B12-Intrinsic Factor (INTRINSI B12-FOLATE) 700-174-94 MCG-MCG-MG TABS Take 1 tablet by mouth daily.     . Multiple Vitamins-Minerals (RA VISION-VITE PRESERVE PO) Take by mouth.    . NEOMYCIN-POLYMYXIN-HYDROCORTISONE (CORTISPORIN) 1 % SOLN otic solution Apply 1-2 drops to toe BID after soaking 10 mL 1  . NON FORMULARY Tumeric 300 mg take one tablet daily.    Marland Kitchen thyroid (ARMOUR THYROID) 15 MG tablet Take 1 tablet (15 mg total) by mouth daily. 90 tablet 3  . TURMERIC PO Take by mouth daily.     Current Facility-Administered Medications on File Prior to Visit  Medication Dose Route Frequency Provider Last Rate Last Dose  . denosumab (PROLIA) injection 60 mg  60 mg Subcutaneous Q6 months Lucille Passy, MD   60 mg at 08/27/16 1640    Allergies  Allergen  Reactions  . Clindamycin/Lincomycin   . Penicillins     REACTION: hives    Past Medical History:  Diagnosis Date  . Accidental fall from chair   . Allergy    multiple food allergies  . Asthma    as a child  . Colitis 2012  . Coronary atherosclerosis    D/T lipid rich plaque  . GERD (gastroesophageal reflux disease)   . H. pylori infection 2012  . Heart murmur   . Hyperlipidemia   . Hypothyroidism   . Insomnia   . Internal hemorrhoids   . Mitral valve prolapse   . Osteoporosis   . Sarcoidosis ([place]) 1966  . TIA (transient ischemic attack) 04/2005  . Visit for suture removal     Past Surgical History:  Procedure Laterality Date  . ABDOMINAL HYSTERECTOMY  1972  . LYMPH NODE BIOPSY  1966  . TONSILLECTOMY      Family History  Problem Relation Age of Onset  . Throat cancer Mother   . Alcohol abuse Brother     Social History   Social History  . Marital status: Married    Spouse name: N/A  . Number of children: 4  . Years of education: N/A   Occupational History  . Retired-RN    Social History Main Topics  . Smoking  status: Never Smoker  . Smokeless tobacco: Never Used  . Alcohol use Yes     Comment: 3-4 glasses of wine a week  . Drug use: No  . Sexual activity: Not on file   Other Topics Concern  . Not on file   Social History Narrative   Does have a living will.   Would desire CPR but would not want prolonged life support.   The PMH, PSH, Social History, Family History, Medications, and allergies have been reviewed in [facility], and have been updated if relevant.  Review of Systems  Constitutional: Negative.   Gastrointestinal: Positive for abdominal distention, abdominal pain and diarrhea. Negative for anal bleeding, blood in stool, constipation, nausea, rectal pain and vomiting.  All other systems reviewed and are negative.      Objective:    BP 122/86   Pulse 74   Temp 97.4 F (36.3 C)   Ht 5' 2.5" (1.588 m)   Wt 96 lb (43.5 kg)   SpO2 98%    BMI 17.28 kg/m    Physical Exam  Constitutional: She is oriented to person, place, and time. She appears well-developed and well-nourished. No distress.  Eyes: Conjunctivae are normal.  Pulmonary/Chest: Effort normal.  Abdominal: Soft. Bowel sounds are normal. She exhibits no distension and no mass. There is no tenderness. There is no rebound and no guarding.  Neurological: She is alert and oriented to person, place, and time. No cranial nerve deficit.  Skin: Skin is warm and dry. She is not diaphoretic.  Psychiatric: She has a normal mood and affect. Her behavior is normal. Judgment and thought content normal.  Nursing note and vitals reviewed.         Assessment & Plan:   Left sided abdominal pain - Plan: Amb ref to Medical Nutrition Therapy-MNT  Irritable bowel syndrome, unspecified type No Follow-up on file.

## 2016-12-01 NOTE — Assessment & Plan Note (Signed)
Nutrition referral placed.

## 2016-12-01 NOTE — Addendum Note (Signed)
Addended by: Lucille Passy on: 12/01/2016 11:27 AM   Modules accepted: Orders

## 2016-12-01 NOTE — Assessment & Plan Note (Signed)
Resolved.  Exam reassuring. Unclear etiology- IBS vs diverticulitis but symptoms have resolved Call or return to clinic prn if these symptoms worsen or fail to improve as anticipated. If deteriorate, consider placing on course of cipro and flagyl (PCN allergic) to treat diverticulitis.

## 2016-12-01 NOTE — Patient Instructions (Signed)
Great to see you.  Please stop by to see Deanna Cobb on your way out.  Please let me know if symptoms worsen.

## 2016-12-03 ENCOUNTER — Telehealth: Payer: Self-pay | Admitting: Cardiovascular Disease

## 2016-12-03 NOTE — Telephone Encounter (Signed)
Etiology unclear, Could consider a event monitor if symptoms persist Unable to exclude arrhythmia Would recommend monitoring pulse rate when she has episodes. If heart rate is regular and normal speed, blood pressure normal, unclear if this is cardiac If there is sign of arrhythmia, event monitor could be ordered

## 2016-12-03 NOTE — Telephone Encounter (Signed)
Pt calling stating for the last couple days she's been having some Dizzy ness States she does have diverticulitis, so she is not sure if that could be related.  Saw her PCP, thought it was her left ear. But that checked out to be okay Not sure what else it could be Just doesn't feel right.

## 2016-12-03 NOTE — Telephone Encounter (Signed)
Pt reports "not feeling right" a few times a day for the past few days.. This feeling comes and goes and does not correlate with periods of exertion or rest. She felt well during yoga class today but as she was getting ready to paint, she felt "weird".  She sat down and symptoms resolved.Pt did not take BP or heart rate. VS while on the phone 157/80, HR 72. She is not taking any new medications. Denies chest, arm, or jaw pain, shortness of breath, nausea, diaphoresis. No fluttering heart, no tingling in arms or legs. She is unable to describe any symptoms.  Pt recently had a flare up of diverticulitis and saw PCP March 26. November 2016 US carotids showed no significant blockages.  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 . Pt agreeable to check vital signs during next episode.  She will monitor s/s and proceed to ER if symptoms worsen. Routed to Dr. Rockey Situ for further review.

## 2016-12-04 NOTE — Telephone Encounter (Signed)
Pt states she now remembers that sx began when her left ear became impacted with wax.  Reviewed Dr. Donivan Scull recommendations. She is agreeable with plan and would like to continue to monitor sx and will call back if she would like to wear the event monitor.

## 2016-12-05 DIAGNOSIS — M9905 Segmental and somatic dysfunction of pelvic region: Secondary | ICD-10-CM | POA: Diagnosis not present

## 2016-12-05 DIAGNOSIS — M9903 Segmental and somatic dysfunction of lumbar region: Secondary | ICD-10-CM | POA: Diagnosis not present

## 2016-12-05 DIAGNOSIS — M9902 Segmental and somatic dysfunction of thoracic region: Secondary | ICD-10-CM | POA: Diagnosis not present

## 2016-12-16 DIAGNOSIS — Z713 Dietary counseling and surveillance: Secondary | ICD-10-CM | POA: Diagnosis not present

## 2016-12-16 DIAGNOSIS — K580 Irritable bowel syndrome with diarrhea: Secondary | ICD-10-CM | POA: Diagnosis not present

## 2016-12-18 ENCOUNTER — Other Ambulatory Visit: Payer: Self-pay | Admitting: Otolaryngology

## 2016-12-18 DIAGNOSIS — R42 Dizziness and giddiness: Secondary | ICD-10-CM

## 2016-12-18 DIAGNOSIS — H6122 Impacted cerumen, left ear: Secondary | ICD-10-CM | POA: Diagnosis not present

## 2016-12-26 ENCOUNTER — Ambulatory Visit
Admission: RE | Admit: 2016-12-26 | Discharge: 2016-12-26 | Disposition: A | Discharge status: Home / Self Care | Payer: Medicare Other | Source: Ambulatory Visit | Attending: Otolaryngology | Admitting: Otolaryngology

## 2016-12-26 ENCOUNTER — Encounter: Payer: Self-pay | Admitting: Radiology

## 2016-12-26 DIAGNOSIS — R42 Dizziness and giddiness: Secondary | ICD-10-CM | POA: Diagnosis not present

## 2016-12-26 DIAGNOSIS — H9192 Unspecified hearing loss, left ear: Secondary | ICD-10-CM | POA: Diagnosis not present

## 2016-12-26 LAB — POCT I-STAT CREATININE: Creatinine, Ser: 0.7 mg/dL (ref 0.44–1.00)

## 2016-12-26 MED ORDER — GADOBENATE DIMEGLUMINE 529 MG/ML IV SOLN
10.0000 mL | Freq: Once | INTRAVENOUS | Status: AC | PRN
  Administered 2016-12-26: 8 mL via INTRAVENOUS

## 2016-12-30 DIAGNOSIS — R42 Dizziness and giddiness: Secondary | ICD-10-CM | POA: Diagnosis not present

## 2017-01-01 DIAGNOSIS — F0630 Mood disorder due to known physiological condition, unspecified: Secondary | ICD-10-CM | POA: Diagnosis not present

## 2017-01-01 DIAGNOSIS — R42 Dizziness and giddiness: Secondary | ICD-10-CM | POA: Diagnosis not present

## 2017-01-01 DIAGNOSIS — G2579 Other drug induced movement disorders: Secondary | ICD-10-CM | POA: Diagnosis not present

## 2017-01-16 ENCOUNTER — Telehealth: Payer: Self-pay | Admitting: *Deleted

## 2017-01-16 NOTE — Telephone Encounter (Signed)
Information has been submitted to pts insurance for verification of benefits. Awaiting response for coverage  

## 2017-01-26 NOTE — Telephone Encounter (Signed)
Verification of benefits have been processed and an approval has been received for pts prolia injection. Pts estimated cost are appx $0. This is only an estimate and cannot be confirmed until benefits are paid. Please advise pt and schedule if needed. If scheduled, once the injection is received, pls contact me back with the date it was received so that I am able to update prolia folder. Thanks  Lm on pts vm requesting a call back

## 2017-01-27 NOTE — Telephone Encounter (Signed)
Spoke to pt and advised. Pt is agreeable and Ca lab has been scheduled. Pls place orders as she is scheduled for 5/23

## 2017-01-28 ENCOUNTER — Other Ambulatory Visit (INDEPENDENT_AMBULATORY_CARE_PROVIDER_SITE_OTHER): Payer: Medicare Other

## 2017-01-28 DIAGNOSIS — M810 Age-related osteoporosis without current pathological fracture: Secondary | ICD-10-CM

## 2017-01-28 LAB — CALCIUM: Calcium: 9 mg/dL (ref 8.4–10.5)

## 2017-02-25 DIAGNOSIS — M9902 Segmental and somatic dysfunction of thoracic region: Secondary | ICD-10-CM | POA: Diagnosis not present

## 2017-02-25 DIAGNOSIS — M9903 Segmental and somatic dysfunction of lumbar region: Secondary | ICD-10-CM | POA: Diagnosis not present

## 2017-02-25 DIAGNOSIS — M5431 Sciatica, right side: Secondary | ICD-10-CM | POA: Diagnosis not present

## 2017-02-25 DIAGNOSIS — M4716 Other spondylosis with myelopathy, lumbar region: Secondary | ICD-10-CM | POA: Diagnosis not present

## 2017-03-04 ENCOUNTER — Ambulatory Visit: Payer: Medicare Other

## 2017-03-10 ENCOUNTER — Ambulatory Visit (INDEPENDENT_AMBULATORY_CARE_PROVIDER_SITE_OTHER): Payer: Medicare Other

## 2017-03-10 DIAGNOSIS — M810 Age-related osteoporosis without current pathological fracture: Secondary | ICD-10-CM | POA: Diagnosis not present

## 2017-03-10 MED ORDER — DENOSUMAB 60 MG/ML ~~LOC~~ SOLN
60.0000 mg | Freq: Once | SUBCUTANEOUS | Status: AC
  Administered 2017-03-10: 60 mg via SUBCUTANEOUS

## 2017-03-17 DIAGNOSIS — M9902 Segmental and somatic dysfunction of thoracic region: Secondary | ICD-10-CM | POA: Diagnosis not present

## 2017-03-17 DIAGNOSIS — M5431 Sciatica, right side: Secondary | ICD-10-CM | POA: Diagnosis not present

## 2017-03-17 DIAGNOSIS — M9903 Segmental and somatic dysfunction of lumbar region: Secondary | ICD-10-CM | POA: Diagnosis not present

## 2017-03-17 DIAGNOSIS — M4716 Other spondylosis with myelopathy, lumbar region: Secondary | ICD-10-CM | POA: Diagnosis not present

## 2017-04-07 DIAGNOSIS — M9902 Segmental and somatic dysfunction of thoracic region: Secondary | ICD-10-CM | POA: Diagnosis not present

## 2017-04-07 DIAGNOSIS — M5431 Sciatica, right side: Secondary | ICD-10-CM | POA: Diagnosis not present

## 2017-04-07 DIAGNOSIS — M9903 Segmental and somatic dysfunction of lumbar region: Secondary | ICD-10-CM | POA: Diagnosis not present

## 2017-04-07 DIAGNOSIS — M4716 Other spondylosis with myelopathy, lumbar region: Secondary | ICD-10-CM | POA: Diagnosis not present

## 2017-04-21 DIAGNOSIS — M9903 Segmental and somatic dysfunction of lumbar region: Secondary | ICD-10-CM | POA: Diagnosis not present

## 2017-04-21 DIAGNOSIS — M4716 Other spondylosis with myelopathy, lumbar region: Secondary | ICD-10-CM | POA: Diagnosis not present

## 2017-04-21 DIAGNOSIS — M9902 Segmental and somatic dysfunction of thoracic region: Secondary | ICD-10-CM | POA: Diagnosis not present

## 2017-04-21 DIAGNOSIS — M5431 Sciatica, right side: Secondary | ICD-10-CM | POA: Diagnosis not present

## 2017-04-23 ENCOUNTER — Other Ambulatory Visit: Payer: Self-pay | Admitting: Family Medicine

## 2017-05-05 DIAGNOSIS — M4716 Other spondylosis with myelopathy, lumbar region: Secondary | ICD-10-CM | POA: Diagnosis not present

## 2017-05-05 DIAGNOSIS — M9903 Segmental and somatic dysfunction of lumbar region: Secondary | ICD-10-CM | POA: Diagnosis not present

## 2017-05-05 DIAGNOSIS — M5431 Sciatica, right side: Secondary | ICD-10-CM | POA: Diagnosis not present

## 2017-05-05 DIAGNOSIS — M9902 Segmental and somatic dysfunction of thoracic region: Secondary | ICD-10-CM | POA: Diagnosis not present

## 2017-06-30 DIAGNOSIS — M9902 Segmental and somatic dysfunction of thoracic region: Secondary | ICD-10-CM | POA: Diagnosis not present

## 2017-06-30 DIAGNOSIS — M4716 Other spondylosis with myelopathy, lumbar region: Secondary | ICD-10-CM | POA: Diagnosis not present

## 2017-06-30 DIAGNOSIS — M5431 Sciatica, right side: Secondary | ICD-10-CM | POA: Diagnosis not present

## 2017-06-30 DIAGNOSIS — M9903 Segmental and somatic dysfunction of lumbar region: Secondary | ICD-10-CM | POA: Diagnosis not present

## 2017-07-03 DIAGNOSIS — Z08 Encounter for follow-up examination after completed treatment for malignant neoplasm: Secondary | ICD-10-CM | POA: Diagnosis not present

## 2017-07-03 DIAGNOSIS — Z85828 Personal history of other malignant neoplasm of skin: Secondary | ICD-10-CM | POA: Diagnosis not present

## 2017-07-03 DIAGNOSIS — L821 Other seborrheic keratosis: Secondary | ICD-10-CM | POA: Diagnosis not present

## 2017-07-04 DIAGNOSIS — Z23 Encounter for immunization: Secondary | ICD-10-CM | POA: Diagnosis not present

## 2017-08-11 DIAGNOSIS — H903 Sensorineural hearing loss, bilateral: Secondary | ICD-10-CM | POA: Diagnosis not present

## 2017-08-25 DIAGNOSIS — M9903 Segmental and somatic dysfunction of lumbar region: Secondary | ICD-10-CM | POA: Diagnosis not present

## 2017-08-25 DIAGNOSIS — M4716 Other spondylosis with myelopathy, lumbar region: Secondary | ICD-10-CM | POA: Diagnosis not present

## 2017-08-25 DIAGNOSIS — M9902 Segmental and somatic dysfunction of thoracic region: Secondary | ICD-10-CM | POA: Diagnosis not present

## 2017-08-25 DIAGNOSIS — M5431 Sciatica, right side: Secondary | ICD-10-CM | POA: Diagnosis not present

## 2017-09-11 ENCOUNTER — Telehealth: Payer: Self-pay | Admitting: *Deleted

## 2017-09-11 NOTE — Telephone Encounter (Signed)
This pt is no longer a pt at this office, but is due for prolia this month. Please process her benefits and contact her with updates. Pt has upcoming CPE. She has been removed from the [facility] prolia list.

## 2017-09-11 NOTE — Telephone Encounter (Signed)
RB-Here is a [employer] patient/thx dmf

## 2017-09-18 NOTE — Progress Notes (Signed)
Subjective:   Deanna Cobb is a 83 y.o. female who presents for Medicare Annual (Subsequent) preventive examination.  Review of Systems:  No ROS.  Medicare Wellness Visit. Additional risk factors are reflected in the social history.   Sleep patterns: Sleep patters vary. Does not wish to take anything for sleep.  Home Safety/Smoke Alarms: Feels safe in home. Smoke alarms in place. Lives with husband and 1 dogs.   Female:   Pap-  Hysterectomy     Mammo- :Last 09/17/16. Normal. Pt states she will schedule this month. Dexa scan- last 06/17/16: osteoporosis        CCS- Last 01/03/16. No recall on file.    Objective:     Vitals: BP 118/76 (BP Location: Left Arm, Patient Position: Sitting, Cuff Size: Normal)   Pulse 68   Wt 91 lb 3.2 oz (41.4 kg)   SpO2 95%   BMI 16.41 kg/m   Body mass index is 16.41 kg/m.  Advanced Directives 09/23/2017 04/22/2016  Does Patient Have a Medical Advance Directive? Yes No  Type of Paramedic of [place];Living will -  Does patient want to make changes to medical advance directive? No - Patient declined -  Copy of [place] in Chart? Yes -    Tobacco Social History   Tobacco Use  Smoking Status Never Smoker  Smokeless Tobacco Never Used     Counseling given: Not Answered   Clinical Intake: Pain : No/denies pain   Past Medical History:  Diagnosis Date  . Accidental fall from chair   . Allergy    multiple food allergies  . Asthma    as a child  . Colitis 2012  . Coronary atherosclerosis    D/T lipid rich plaque  . GERD (gastroesophageal reflux disease)   . H. pylori infection 2012  . Heart murmur   . Hyperlipidemia   . Hypothyroidism   . Insomnia   . Internal hemorrhoids   . Mitral valve prolapse   . Osteoporosis   . Sarcoidosis 1966  . TIA (transient ischemic attack) 04/2005  . Visit for suture removal    Past Surgical History:  Procedure Laterality Date  . ABDOMINAL HYSTERECTOMY   1972  . LYMPH NODE BIOPSY  1966  . TONSILLECTOMY     Family History  Problem Relation Age of Onset  . Throat cancer Mother   . Alcohol abuse Brother   . Heart disease Sister    Social History   Socioeconomic History  . Marital status: Married    Spouse name: None  . Number of children: 4  . Years of education: None  . Highest education level: None  Social Needs  . Financial resource strain: None  . Food insecurity - worry: None  . Food insecurity - inability: None  . Transportation needs - medical: None  . Transportation needs - non-medical: None  Occupational History  . Occupation: Retired-RN  Tobacco Use  . Smoking status: Never Smoker  . Smokeless tobacco: Never Used  Substance and Sexual Activity  . Alcohol use: Yes    Comment: 7 glasses of wine a week  . Drug use: No  . Sexual activity: No  Other Topics Concern  . None  Social History Narrative   Does have a living will.   Would desire CPR but would not want prolonged life support.    Outpatient Encounter Medications as of 09/23/2017  Medication Sig  . Alpha-Lipoic Acid 600 MG CAPS Take by mouth.  Marland Kitchen  ARMOUR THYROID 15 MG tablet TAKE 1 TABLET (15 MG TOTAL) BY MOUTH DAILY.  Marland Kitchen aspirin 81 MG tablet Take 81 mg by mouth daily.    . cholecalciferol (VITAMIN D) 1000 UNITS tablet Take 1,000 Units by mouth daily.   . Coenzyme Q10 (COQ-10) 100 MG CAPS Take one by mouth daily  . Folate-B12-Intrinsic Factor (INTRINSI B12-FOLATE) 322-025-42 MCG-MCG-MG TABS Take 1 tablet by mouth daily.   . Multiple Vitamins-Minerals (RA VISION-VITE PRESERVE PO) Take by mouth.  . NEOMYCIN-POLYMYXIN-HYDROCORTISONE (CORTISPORIN) 1 % SOLN otic solution Apply 1-2 drops to toe BID after soaking  . TURMERIC PO Take by mouth daily.  . [DISCONTINUED] NON FORMULARY Tumeric 300 mg take one tablet daily.  . [DISCONTINUED] acidophilus (RISAQUAD) CAPS Take 1 capsule by mouth daily.  . [DISCONTINUED] DHEA 10 MG CAPS Take one by mouth daily    Facility-Administered Encounter Medications as of 09/23/2017  Medication  . denosumab (PROLIA) injection 60 mg    Activities of Daily Living In your present state of health, do you have any difficulty performing the following activities: 09/23/2017  Hearing? Y  Comment left ear issues. Follows with ENT  Vision? N  Comment hx of cataract sx. wears  readers  Difficulty concentrating or making decisions? N  Walking or climbing stairs? N  Dressing or bathing? N  Doing errands, shopping? N  Preparing Food and eating ? N  Using the Toilet? N  In the past six months, have you accidently leaked urine? Y  Do you have problems with loss of bowel control? N  Managing your Medications? N  Managing your Finances? N  Housekeeping or managing your Housekeeping? N  Some recent data might be hidden    Patient Care Team: Lucille Passy, MD as PCP - General (Family Medicine) Richmond Campbell, MD as Consulting Physician (Gastroenterology) Barrie Dunker, MD as Consulting Physician (Dermatology) Lafayette Dragon, MD (Inactive) (Gastroenterology) Minna Merritts, MD as Consulting Physician (Cardiology)    Assessment:   This is a routine wellness examination for Deanna Cobb. Physical assessment deferred to PCP.  Exercise Activities and Dietary recommendations Current Exercise Habits: Home exercise routine, Type of exercise: yoga;strength training/weights, Time (Minutes): 30, Frequency (Times/Week): 5, Weekly Exercise (Minutes/Week): 150, Intensity: Mild   Diet (meal preparation, eat out, water intake, caffeinated beverages, dairy products, fruits and vegetables): in general, a "healthy" diet     Goals    . Maintain active healthy lifestyle. (pt-stated)       Fall Risk Fall Risk  09/23/2017 06/02/2016 05/30/2015  Falls in the past year? No Yes No  Injury with Fall? - Yes -  Risk for fall due to : - Other (Comment) -    Depression Screen PHQ 2/9 Scores 09/23/2017 06/02/2016 05/30/2015  05/11/2014  PHQ - 2 Score 0 0 0 0     Cognitive Function MMSE - Mini Mental State Exam 09/23/2017  Orientation to time 5  Orientation to Place 5  Registration 3  Attention/ Calculation 5  Recall 2  Language- name 2 objects 2  Language- repeat 1  Language- follow 3 step command 3  Language- read & follow direction 1  Write a sentence 1  Copy design 1  Total score 29        Immunization History  Administered Date(s) Administered  . Influenza Split 06/11/2011, 06/15/2012  . Influenza, High Dose Seasonal PF 07/04/2017  . Influenza,inj,Quad PF,6+ Mos 05/11/2014, 06/02/2016  . Pneumococcal Conjugate-13 05/11/2014  . Pneumococcal Polysaccharide-23 03/21/2009  . Td 03/29/2009  .  Zoster 03/16/2008    Screening Tests Health Maintenance  Topic Date Due  . TETANUS/TDAP  03/30/2019  . INFLUENZA VACCINE  Completed  . DEXA SCAN  Completed  . PNA vac Low Risk Adult  Completed      Plan:   Follow up with Dr.Aron as scheduled 10/01/17.   Continue to eat heart healthy diet (full of fruits, vegetables, whole grains, lean protein, water--limit salt, fat, and sugar intake) and increase physical activity as tolerated.  Continue doing brain stimulating activities (puzzles, reading, adult coloring books, staying active) to keep memory sharp.   Eduard Roux, RN will be calling you concerning your Prolia.  I have personally reviewed and noted the following in the patient's chart:   . Medical and social history . Use of alcohol, tobacco or illicit drugs  . Current medications and supplements . Functional ability and status . Nutritional status . Physical activity . Advanced directives . List of other physicians . Hospitalizations, surgeries, and ER visits in previous 12 months . Vitals . Screenings to include cognitive, depression, and falls . Referrals and appointments  In addition, I have reviewed and discussed with patient certain preventive protocols, quality metrics, and best  practice recommendations. A written personalized care plan for preventive services as well as general preventive health recommendations were provided to patient.     Shela Nevin, [place]  09/23/2017

## 2017-09-21 ENCOUNTER — Other Ambulatory Visit: Payer: Self-pay | Admitting: Family Medicine

## 2017-09-21 DIAGNOSIS — E782 Mixed hyperlipidemia: Secondary | ICD-10-CM

## 2017-09-21 DIAGNOSIS — E039 Hypothyroidism, unspecified: Secondary | ICD-10-CM

## 2017-09-21 DIAGNOSIS — Z8673 Personal history of transient ischemic attack (TIA), and cerebral infarction without residual deficits: Secondary | ICD-10-CM

## 2017-09-23 ENCOUNTER — Ambulatory Visit (INDEPENDENT_AMBULATORY_CARE_PROVIDER_SITE_OTHER): Payer: Medicare Other | Admitting: Behavioral Health

## 2017-09-23 ENCOUNTER — Other Ambulatory Visit (INDEPENDENT_AMBULATORY_CARE_PROVIDER_SITE_OTHER): Payer: Medicare Other

## 2017-09-23 ENCOUNTER — Encounter: Payer: Self-pay | Admitting: Behavioral Health

## 2017-09-23 VITALS — BP 118/76 | HR 68 | Wt 91.2 lb

## 2017-09-23 DIAGNOSIS — E039 Hypothyroidism, unspecified: Secondary | ICD-10-CM

## 2017-09-23 DIAGNOSIS — Z8673 Personal history of transient ischemic attack (TIA), and cerebral infarction without residual deficits: Secondary | ICD-10-CM | POA: Diagnosis not present

## 2017-09-23 DIAGNOSIS — Z0000 Encounter for general adult medical examination without abnormal findings: Secondary | ICD-10-CM | POA: Diagnosis not present

## 2017-09-23 DIAGNOSIS — E782 Mixed hyperlipidemia: Secondary | ICD-10-CM

## 2017-09-23 LAB — COMPREHENSIVE METABOLIC PANEL
ALBUMIN: 4.4 g/dL (ref 3.5–5.2)
ALT: 14 U/L (ref 0–35)
AST: 20 U/L (ref 0–37)
Alkaline Phosphatase: 43 U/L (ref 39–117)
BUN: 13 mg/dL (ref 6–23)
CALCIUM: 9.3 mg/dL (ref 8.4–10.5)
CHLORIDE: 101 meq/L (ref 96–112)
CO2: 30 meq/L (ref 19–32)
Creatinine, Ser: 0.76 mg/dL (ref 0.40–1.20)
GFR: 77.64 mL/min (ref >60.00)
Glucose, Bld: 90 mg/dL (ref 70–99)
Potassium: 4.1 mEq/L (ref 3.5–5.1)
Sodium: 140 mEq/L (ref 135–145)
Total Bilirubin: 1.4 mg/dL — ABNORMAL HIGH (ref 0.2–1.2)
Total Protein: 6.3 g/dL (ref 6.0–8.3)

## 2017-09-23 LAB — CBC WITH DIFFERENTIAL/PLATELET
BASOS PCT: 0.7 % (ref 0.0–3.0)
Basophils Absolute: 0 10*3/uL (ref 0.0–0.1)
EOS ABS: 0.1 10*3/uL (ref 0.0–0.7)
EOS PCT: 1.3 % (ref 0.0–5.0)
HEMATOCRIT: 42.6 % (ref 36.0–46.0)
HEMOGLOBIN: 14.2 g/dL (ref 12.0–15.0)
LYMPHS PCT: 31.7 % (ref 12.0–46.0)
Lymphs Abs: 1.6 10*3/uL (ref 0.7–4.0)
MCHC: 33.3 g/dL (ref 30.0–36.0)
MCV: 98.3 fl (ref 78.0–100.0)
MONOS PCT: 6.8 % (ref 3.0–12.0)
Monocytes Absolute: 0.3 10*3/uL (ref 0.1–1.0)
NEUTROS ABS: 3 10*3/uL (ref 1.4–7.7)
Neutrophils Relative %: 59.5 % (ref 43.0–77.0)
PLATELETS: 235 10*3/uL (ref 150.0–400.0)
RBC: 4.34 Mil/uL (ref 3.87–5.11)
RDW: 12.8 % (ref 11.5–15.5)
WBC: 5.1 10*3/uL (ref 4.0–10.5)

## 2017-09-23 LAB — LIPID PANEL
CHOL/HDL RATIO: 3
CHOLESTEROL: 208 mg/dL — AB (ref 0–200)
HDL: 72 mg/dL (ref >39.00)
LDL CALC: 119 mg/dL — AB (ref 0–99)
NonHDL: 136.29
TRIGLYCERIDES: 87 mg/dL (ref 0.0–149.0)
VLDL: 17.4 mg/dL (ref 0.0–40.0)

## 2017-09-23 LAB — TSH: TSH: 3.38 u[IU]/mL (ref 0.35–4.50)

## 2017-09-23 NOTE — Patient Instructions (Signed)
Follow up with Dr.Aron as scheduled 10/01/17.   Continue to eat heart healthy diet (full of fruits, vegetables, whole grains, lean protein, water--limit salt, fat, and sugar intake) and increase physical activity as tolerated.  Continue doing brain stimulating activities (puzzles, reading, adult coloring books, staying active) to keep memory sharp.   Eduard Roux, RN will be calling you concerning your Prolia.   Deanna Cobb , Thank you for taking time to come for your Medicare Wellness Visit. I appreciate your ongoing commitment to your health goals. Please review the following plan we discussed and let me know if I can assist you in the future.   These are the goals we discussed: Goals    . Maintain active healthy lifestyle. (pt-stated)       This is a list of the screening recommended for you and due dates:  Health Maintenance  Topic Date Due  . Tetanus Vaccine  03/30/2019  . Flu Shot  Completed  . DEXA scan (bone density measurement)  Completed  . Pneumonia vaccines  Completed    Health Maintenance for Postmenopausal Women Menopause is a normal process in which your reproductive ability comes to an end. This process happens gradually over a span of months to years, usually between the ages of 33 and 60. Menopause is complete when you have missed 12 consecutive menstrual periods. It is important to talk with your health care provider about some of the most common conditions that affect postmenopausal women, such as heart disease, cancer, and bone loss (osteoporosis). Adopting a healthy lifestyle and getting preventive care can help to promote your health and wellness. Those actions can also lower your chances of developing some of these common conditions. What should I know about menopause? During menopause, you may experience a number of symptoms, such as:  Moderate-to-severe hot flashes.  Night sweats.  Decrease in sex drive.  Mood  swings.  Headaches.  Tiredness.  Irritability.  Memory problems.  Insomnia.  Choosing to treat or not to treat menopausal changes is an individual decision that you make with your health care provider. What should I know about hormone replacement therapy and supplements? Hormone therapy products are effective for treating symptoms that are associated with menopause, such as hot flashes and night sweats. Hormone replacement carries certain risks, especially as you become older. If you are thinking about using estrogen or estrogen with progestin treatments, discuss the benefits and risks with your health care provider. What should I know about heart disease and stroke? Heart disease, heart attack, and stroke become more likely as you age. This may be due, in part, to the hormonal changes that your body experiences during menopause. These can affect how your body processes dietary fats, triglycerides, and cholesterol. Heart attack and stroke are both medical emergencies. There are many things that you can do to help prevent heart disease and stroke:  Have your blood pressure checked at least every 1-2 years. High blood pressure causes heart disease and increases the risk of stroke.  If you are 59-80 years old, ask your health care provider if you should take aspirin to prevent a heart attack or a stroke.  Do not use any tobacco products, including cigarettes, chewing tobacco, or electronic cigarettes. If you need help quitting, ask your health care provider.  It is important to eat a healthy diet and maintain a healthy weight. ? Be sure to include plenty of vegetables, fruits, low-fat dairy products, and lean protein. ? Avoid eating foods that are high  solid fats, added sugars, or salt (sodium).  Get regular exercise. This is one of the most important things that you can do for your health. ? Try to exercise for at least 150 minutes each week. The type of exercise that you do should  increase your heart rate and make you sweat. This is known as moderate-intensity exercise. ? Try to do strengthening exercises at least twice each week. Do these in addition to the moderate-intensity exercise.  Know your numbers.Ask your health care provider to check your cholesterol and your blood glucose. Continue to have your blood tested as directed by your health care provider.  What should I know about cancer screening? There are several types of cancer. Take the following steps to reduce your risk and to catch any cancer development as early as possible. Breast Cancer  Practice breast self-awareness. ? This means understanding how your breasts normally appear and feel. ? It also means doing regular breast self-exams. Let your health care provider know about any changes, no matter how small.  If you are 40 or older, have a clinician do a breast exam (clinical breast exam or CBE) every year. Depending on your age, family history, and medical history, it may be recommended that you also have a yearly breast X-ray (mammogram).  If you have a family history of breast cancer, talk with your health care provider about genetic screening.  If you are at high risk for breast cancer, talk with your health care provider about having an MRI and a mammogram every year.  Breast cancer (BRCA) gene test is recommended for women who have family members with BRCA-related cancers. Results of the assessment will determine the need for genetic counseling and BRCA1 and for BRCA2 testing. BRCA-related cancers include these types: ? Breast. This occurs in males or females. ? Ovarian. ? Tubal. This may also be called fallopian tube cancer. ? Cancer of the abdominal or pelvic lining (peritoneal cancer). ? Prostate. ? Pancreatic.  Cervical, Uterine, and Ovarian Cancer Your health care provider may recommend that you be screened regularly for cancer of the pelvic organs. These include your ovaries, uterus,  and vagina. This screening involves a pelvic exam, which includes checking for microscopic changes to the surface of your cervix (Pap test).  For women ages 21-65, health care providers may recommend a pelvic exam and a Pap test every three years. For women ages 30-65, they may recommend the Pap test and pelvic exam, combined with testing for human papilloma virus (HPV), every five years. Some types of HPV increase your risk of cervical cancer. Testing for HPV may also be done on women of any age who have unclear Pap test results.  Other health care providers may not recommend any screening for nonpregnant women who are considered low risk for pelvic cancer and have no symptoms. Ask your health care provider if a screening pelvic exam is right for you.  If you have had past treatment for cervical cancer or a condition that could lead to cancer, you need Pap tests and screening for cancer for at least 20 years after your treatment. If Pap tests have been discontinued for you, your risk factors (such as having a new sexual partner) need to be reassessed to determine if you should start having screenings again. Some women have medical problems that increase the chance of getting cervical cancer. In these cases, your health care provider may recommend that you have screening and Pap tests more often.  If you have   a family history of uterine cancer or ovarian cancer, talk with your health care provider about genetic screening.  If you have vaginal bleeding after reaching menopause, tell your health care provider.  There are currently no reliable tests available to screen for ovarian cancer.  Lung Cancer Lung cancer screening is recommended for adults 55-80 years old who are at high risk for lung cancer because of a history of smoking. A yearly low-dose CT scan of the lungs is recommended if you:  Currently smoke.  Have a history of at least 30 pack-years of smoking and you currently smoke or have quit  within the past 15 years. A pack-year is smoking an average of one pack of cigarettes per day for one year.  Yearly screening should:  Continue until it has been 15 years since you quit.  Stop if you develop a health problem that would prevent you from having lung cancer treatment.  Colorectal Cancer  This type of cancer can be detected and can often be prevented.  Routine colorectal cancer screening usually begins at age 50 and continues through age 75.  If you have risk factors for colon cancer, your health care provider may recommend that you be screened at an earlier age.  If you have a family history of colorectal cancer, talk with your health care provider about genetic screening.  Your health care provider may also recommend using home test kits to check for hidden blood in your stool.  A small camera at the end of a tube can be used to examine your colon directly (sigmoidoscopy or colonoscopy). This is done to check for the earliest forms of colorectal cancer.  Direct examination of the colon should be repeated every 5-10 years until age 75. However, if early forms of precancerous polyps or small growths are found or if you have a family history or genetic risk for colorectal cancer, you may need to be screened more often.  Skin Cancer  Check your skin from head to toe regularly.  Monitor any moles. Be sure to tell your health care provider: ? About any new moles or changes in moles, especially if there is a change in a mole's shape or color. ? If you have a mole that is larger than the size of a pencil eraser.  If any of your family members has a history of skin cancer, especially at a young age, talk with your health care provider about genetic screening.  Always use sunscreen. Apply sunscreen liberally and repeatedly throughout the day.  Whenever you are outside, protect yourself by wearing long sleeves, pants, a wide-brimmed hat, and sunglasses.  What should I know  about osteoporosis? Osteoporosis is a condition in which bone destruction happens more quickly than new bone creation. After menopause, you may be at an increased risk for osteoporosis. To help prevent osteoporosis or the bone fractures that can happen because of osteoporosis, the following is recommended:  If you are 19-50 years old, get at least 1,000 mg of calcium and at least 600 mg of vitamin D per day.  If you are older than age 50 but younger than age 70, get at least 1,200 mg of calcium and at least 600 mg of vitamin D per day.  If you are older than age 70, get at least 1,200 mg of calcium and at least 800 mg of vitamin D per day.  Smoking and excessive alcohol intake increase the risk of osteoporosis. Eat foods that are rich in calcium and   and vitamin D, and do weight-bearing exercises several times each week as directed by your health care provider. What should I know about how menopause affects my mental health? Depression may occur at any age, but it is more common as you become older. Common symptoms of depression include:  Low or sad mood.  Changes in sleep patterns.  Changes in appetite or eating patterns.  Feeling an overall lack of motivation or enjoyment of activities that you previously enjoyed.  Frequent crying spells.  Talk with your health care provider if you think that you are experiencing depression. What should I know about immunizations? It is important that you get and maintain your immunizations. These include:  Tetanus, diphtheria, and pertussis (Tdap) booster vaccine.  Influenza every year before the flu season begins.  Pneumonia vaccine.  Shingles vaccine.  Your health care provider may also recommend other immunizations. This information is not intended to replace advice given to you by your health care provider. Make sure you discuss any questions you have with your health care provider. Document Released: 10/17/2005 Document Revised: 03/14/2016  Document Reviewed: 05/29/2015 Elsevier Interactive Patient Education  2018 [employer].

## 2017-09-23 NOTE — Progress Notes (Signed)
Medical screening examination/treatment/procedure(s) were performed by the Wellness Coach, RN. As primary care provider I was immediately available for consulation/collaboration. I agree with above documentation. Sherrika Weakland, AGNP-C 

## 2017-10-01 ENCOUNTER — Ambulatory Visit (INDEPENDENT_AMBULATORY_CARE_PROVIDER_SITE_OTHER): Payer: Medicare Other | Admitting: Family Medicine

## 2017-10-01 ENCOUNTER — Encounter: Payer: Self-pay | Admitting: Family Medicine

## 2017-10-01 VITALS — BP 124/54 | HR 81 | Temp 98.0°F | Ht 62.5 in | Wt 93.8 lb

## 2017-10-01 DIAGNOSIS — I341 Nonrheumatic mitral (valve) prolapse: Secondary | ICD-10-CM | POA: Diagnosis not present

## 2017-10-01 DIAGNOSIS — E039 Hypothyroidism, unspecified: Secondary | ICD-10-CM

## 2017-10-01 DIAGNOSIS — E782 Mixed hyperlipidemia: Secondary | ICD-10-CM

## 2017-10-01 DIAGNOSIS — M810 Age-related osteoporosis without current pathological fracture: Secondary | ICD-10-CM | POA: Diagnosis not present

## 2017-10-01 DIAGNOSIS — K589 Irritable bowel syndrome without diarrhea: Secondary | ICD-10-CM

## 2017-10-01 NOTE — Patient Instructions (Signed)
Happy birthday!!

## 2017-10-01 NOTE — Assessment & Plan Note (Signed)
Symptoms under good control.

## 2017-10-01 NOTE — Progress Notes (Signed)
HPI Deanna Cobb is a very pleasant 83 yo female here today for  follow up of chronic medical conditions.  Medicare annual wellness visit with Derwood Kaplan, RN on 09/23/17. Note reviewed.   Followed by cardiology for management of her history of a TIA , mitral valve prolapse, and hyperlipidemia. Taking low-dose aspirin.  She stopped taking Crestor because she felt it was making her "mind cloudy.  Taking red yeast rice.  Sees Dr. Rockey Situ- has not seen him since 06/2016.  Wt Readings from Last 3 Encounters:  10/01/17 93 lb 12.8 oz (42.5 kg)  09/23/17 91 lb 3.2 oz (41.4 kg)  12/01/16 96 lb (43.5 kg)    Lab Results  Component Value Date   CHOL 208 (H) 09/23/2017   HDL 72.00 09/23/2017   LDLCALC 119 (H) 09/23/2017   LDLDIRECT 109.0 06/15/2012   TRIG 87.0 09/23/2017   CHOLHDL 3 09/23/2017    Hypothyroidism- on armour.  Denies symptoms of hypo or hyperthyroidism.    Lab Results  Component Value Date   TSH 3.38 09/23/2017    Osteoporosis- DEXA 06/17/16.  Has been receiving prolia.  Last injection 04/2017.  Due for another injection.    Past Medical History:  Diagnosis Date  . Accidental fall from chair   . Allergy    multiple food allergies  . Asthma    as a child  . Colitis 2012  . Coronary atherosclerosis    D/T lipid rich plaque  . GERD (gastroesophageal reflux disease)   . H. pylori infection 2012  . Heart murmur   . Hyperlipidemia   . Hypothyroidism   . Insomnia   . Internal hemorrhoids   . Mitral valve prolapse   . Osteoporosis   . Sarcoidosis 1966  . TIA (transient ischemic attack) 04/2005  . Visit for suture removal     Past Surgical History:  Procedure Laterality Date  . ABDOMINAL HYSTERECTOMY  1972  . LYMPH NODE BIOPSY  1966  . TONSILLECTOMY      Family History  Problem Relation Age of Onset  . Throat cancer Mother   . Alcohol abuse Brother   . Heart disease Sister     Social History   Socioeconomic History  . Marital status: Married   Spouse name: Not on file  . Number of children: 4  . Years of education: Not on file  . Highest education level: Not on file  Social Needs  . Financial resource strain: Not on file  . Food insecurity - worry: Not on file  . Food insecurity - inability: Not on file  . Transportation needs - medical: Not on file  . Transportation needs - non-medical: Not on file  Occupational History  . Occupation: Retired-RN  Tobacco Use  . Smoking status: Never Smoker  . Smokeless tobacco: Never Used  Substance and Sexual Activity  . Alcohol use: Yes    Comment: 7 glasses of wine a week  . Drug use: No  . Sexual activity: No  Other Topics Concern  . Not on file  Social History Narrative   Does have a living will.   Would desire CPR but would not want prolonged life support.    Allergies  Allergen Reactions  . Clindamycin/Lincomycin   . Penicillins     REACTION: hives    Current Outpatient Medications  Medication Sig Dispense Refill  . Alpha-Lipoic Acid 600 MG CAPS Take by mouth.    Francia Greaves THYROID 15 MG tablet TAKE 1 TABLET (15 MG TOTAL)  BY MOUTH DAILY. 90 tablet 2  . aspirin 81 MG tablet Take 81 mg by mouth daily.      . cholecalciferol (VITAMIN D) 1000 UNITS tablet Take 1,000 Units by mouth daily.     . Coenzyme Q10 (COQ-10) 100 MG CAPS Take one by mouth daily    . Folate-B12-Intrinsic Factor (INTRINSI B12-FOLATE) 371-062-69 MCG-MCG-MG TABS Take 1 tablet by mouth daily.     . Multiple Vitamins-Minerals (MULTIVITAMIN ADULTS PO) Take by mouth. Emtero Vite    . Omega-3 Fatty Acids (EPA) 1000 MG CAPS Take 1 capsule by mouth 3 (three) times daily.     . TURMERIC PO Take by mouth daily.     Current Facility-Administered Medications  Medication Dose Route Frequency Provider Last Rate Last Dose  . denosumab (PROLIA) injection 60 mg  60 mg Subcutaneous Q6 months Lucille Passy, MD   60 mg at 08/27/16 1640    Review of Systems  Constitutional: Negative.   HENT: Negative for hearing loss.    Eyes: Negative.   Respiratory: Negative.   Cardiovascular: Negative.   Gastrointestinal: Negative.   Endocrine: Negative.   Genitourinary: Negative.   Musculoskeletal: Negative for back pain.  Allergic/Immunologic: Negative.   Neurological: Negative.   Hematological: Negative.   Psychiatric/Behavioral: Negative.   All other systems reviewed and are negative.     Physical exam  BP (!) 124/54 (BP Location: Left Arm, Patient Position: Sitting, Cuff Size: Normal)   Pulse 81   Temp 98 F (36.7 C) (Oral)   Ht 5' 2.5" (1.588 m)   Wt 93 lb 12.8 oz (42.5 kg)   SpO2 98%   BMI 16.88 kg/m  Physical Exam  Constitutional: She is oriented to person, place, and time and well-developed, well-nourished, and in no distress. No distress.  HENT:  Head: Normocephalic and atraumatic.  Eyes: Conjunctivae are normal.  Neck: Normal range of motion. Neck supple.  Cardiovascular: Normal rate, regular rhythm and normal heart sounds.  Pulmonary/Chest: Effort normal and breath sounds normal.  Abdominal: Soft. Bowel sounds are normal.  Musculoskeletal: Normal range of motion. She exhibits no edema or tenderness.  Neurological: She is alert and oriented to person, place, and time.  Skin: Skin is warm and dry. She is not diaphoretic.  Psychiatric: Mood, memory, affect and judgment normal.  Nursing note and vitals reviewed.

## 2017-10-01 NOTE — Assessment & Plan Note (Signed)
Followed by cardiology.  Doing well.   

## 2017-10-01 NOTE — Assessment & Plan Note (Signed)
Euthyroid.  No changes made to rxs.

## 2017-10-01 NOTE — Telephone Encounter (Addendum)
Summary of benefits have been received for Prolia. No PA is required. RN discussed in person with the patient that the estimated out of pocket expense is $185. She voiced understanding. Nurse visit appointment has been scheduled on 10/08/17 at 10:00 AM for prolia injection.

## 2017-10-01 NOTE — Assessment & Plan Note (Signed)
Due for another prolia injection. Edd Arbour will order this for her today.

## 2017-10-05 ENCOUNTER — Other Ambulatory Visit: Payer: Self-pay | Admitting: Family Medicine

## 2017-10-05 DIAGNOSIS — Z139 Encounter for screening, unspecified: Secondary | ICD-10-CM

## 2017-10-08 ENCOUNTER — Ambulatory Visit (INDEPENDENT_AMBULATORY_CARE_PROVIDER_SITE_OTHER): Payer: Medicare Other | Admitting: Behavioral Health

## 2017-10-08 ENCOUNTER — Ambulatory Visit: Payer: Medicare Other

## 2017-10-08 DIAGNOSIS — M810 Age-related osteoporosis without current pathological fracture: Secondary | ICD-10-CM

## 2017-10-08 MED ORDER — DENOSUMAB 60 MG/ML ~~LOC~~ SOLN
60.0000 mg | Freq: Once | SUBCUTANEOUS | Status: AC
  Administered 2017-10-08: 60 mg via SUBCUTANEOUS

## 2017-10-08 NOTE — Progress Notes (Signed)
Patient came in office today for Prolia injection. SQ injection was given in the left arm. Patient tolerated it well. No s/s of a reaction prior to leaving the nurse visit.

## 2017-10-20 DIAGNOSIS — M9902 Segmental and somatic dysfunction of thoracic region: Secondary | ICD-10-CM | POA: Diagnosis not present

## 2017-10-20 DIAGNOSIS — M5431 Sciatica, right side: Secondary | ICD-10-CM | POA: Diagnosis not present

## 2017-10-20 DIAGNOSIS — M4716 Other spondylosis with myelopathy, lumbar region: Secondary | ICD-10-CM | POA: Diagnosis not present

## 2017-10-20 DIAGNOSIS — M9903 Segmental and somatic dysfunction of lumbar region: Secondary | ICD-10-CM | POA: Diagnosis not present

## 2017-11-13 ENCOUNTER — Ambulatory Visit
Admission: RE | Admit: 2017-11-13 | Discharge: 2017-11-13 | Disposition: A | Discharge status: Home / Self Care | Payer: Medicare Other | Source: Ambulatory Visit | Attending: Family Medicine | Admitting: Family Medicine

## 2017-11-13 DIAGNOSIS — Z139 Encounter for screening, unspecified: Secondary | ICD-10-CM

## 2017-11-13 DIAGNOSIS — Z1231 Encounter for screening mammogram for malignant neoplasm of breast: Secondary | ICD-10-CM | POA: Diagnosis not present

## 2017-11-15 DIAGNOSIS — I739 Peripheral vascular disease, unspecified: Secondary | ICD-10-CM | POA: Insufficient documentation

## 2017-11-15 NOTE — Progress Notes (Signed)
Cardiology Office Note  Date:  11/17/2017   ID:  Deanna Cobb, DOB [birth year], MRN [id number]  PCP:  Lucille Passy, MD   Chief Complaint  Patient presents with  . OTHER    12 month f/u c/o right shoulder blade pain. Meds reviewed verbally with pt.    HPI:  Deanna Cobb is a pleasant 83 year old woman with history of  TIA in 2006,  workup at that time showing mild atherosclerosis of the carotids bilaterally,  echocardiogram in 2006 suggesting borderline mitral valve prolapse, trace MR, normal ejection fraction IBS GERD who presents for routine follow-up of her carotid disease and hyperlipidemia  Back from [place]  Was in [place] for 2 weeks  Reports having pain in her right scapular area Also having worsening GERD symptoms  Previously took PPI for 2 weeks then the instructions said to stop  recurrence of her symptoms  Wonders if her GERD and back symptoms are connected  Otherwise athletic, active, no chest pain or shortness of breath with exertion Eating very healthy  EKG personally reviewed by myself on todays visit Shows normal sinus rhythm rate 64 bpm no significant ST or T wave changes  Other past medical history reviewed Carotid ultrasound done November 2016 showing no significant stenosis, only intimal thickening  Lab work reviewed CRP 0.1, total cholesterol greater than 200  CT scan abdomen from 2012 reviewed with her details, images pulled up in the office one spot with minimal CA athero, ostial left common iliac artery otherwise no significant disease  PMH:   has a past medical history of Accidental fall from chair, Allergy, Asthma, Colitis (2012), Coronary atherosclerosis, GERD (gastroesophageal reflux disease), H. pylori infection (2012), Heart murmur, Hyperlipidemia, Hypothyroidism, Insomnia, Internal hemorrhoids, Mitral valve prolapse, Osteoporosis, Sarcoidosis (1966), TIA (transient ischemic attack) (04/2005), and Visit for suture removal.  PSH:    Past  Surgical History:  Procedure Laterality Date  . ABDOMINAL HYSTERECTOMY  1972  . LYMPH NODE BIOPSY  1966  . TONSILLECTOMY      Current Outpatient Medications  Medication Sig Dispense Refill  . Alpha-Lipoic Acid 600 MG CAPS Take by mouth.    Francia Greaves THYROID 15 MG tablet TAKE 1 TABLET (15 MG TOTAL) BY MOUTH DAILY. 90 tablet 2  . aspirin 81 MG tablet Take 81 mg by mouth daily.      . cholecalciferol (VITAMIN D) 1000 UNITS tablet Take 1,000 Units by mouth daily as needed.     . Coenzyme Q10 (COQ-10) 100 MG CAPS Take one by mouth daily    . Folate-B12-Intrinsic Factor (INTRINSI B12-FOLATE) [id number] MCG-MCG-MG TABS Take 1 tablet by mouth daily.     . Multiple Vitamins-Minerals (MULTIVITAMIN ADULTS PO) Take by mouth. Emtero Vite    . Omega-3 Fatty Acids (EPA) 1000 MG CAPS Take 1 capsule by mouth 3 (three) times daily.     . TURMERIC PO Take by mouth daily.     Current Facility-Administered Medications  Medication Dose Route Frequency Provider Last Rate Last Dose  . denosumab (PROLIA) injection 60 mg  60 mg Subcutaneous Q6 months Lucille Passy, MD   60 mg at 08/27/16 1640     Allergies:   Clindamycin/lincomycin and Penicillins   Social History:  The patient  reports that  has never smoked. she has never used smokeless tobacco. She reports that she drinks alcohol. She reports that she does not use drugs.   Family History:   family history includes Alcohol abuse in her brother; Heart disease in her  sister; Throat cancer in her mother.    Review of Systems: Review of Systems  Constitutional: Negative.   Respiratory: Negative.   Cardiovascular: Negative.   Gastrointestinal: Positive for heartburn.  Musculoskeletal: Positive for back pain.  Neurological: Negative.   Psychiatric/Behavioral: Negative.   All other systems reviewed and are negative.    PHYSICAL EXAM: VS:  BP 138/76 (BP Location: Left Arm, Patient Position: Sitting, Cuff Size: Normal)   Pulse 64   Ht 5' 2.5" (1.588 m)    Wt 93 lb 8 oz (42.4 kg)   BMI 16.83 kg/m  , BMI Body mass index is 16.83 kg/m. Constitutional:  oriented to person, place, and time. No distress. thin HENT:  Head: Normocephalic and atraumatic.  Eyes:  no discharge. No scleral icterus.  Neck: Normal range of motion. Neck supple. No JVD present.  Cardiovascular: Normal rate, regular rhythm, normal heart sounds and intact distal pulses. Exam reveals no gallop and no friction rub. No edema No murmur heard. Pulmonary/Chest: Effort normal and breath sounds normal. No stridor. No respiratory distress.  no wheezes.  no rales.  no tenderness.  Abdominal: Soft.  no distension.  no tenderness.  Musculoskeletal: Normal range of motion.  no  tenderness or deformity.  Neurological:  normal muscle tone. Coordination normal. No atrophy Skin: Skin is warm and dry. No rash noted. not diaphoretic.  Psychiatric:  normal mood and affect. behavior is normal. Thought content normal.      Recent Labs: 09/23/2017: ALT 14; BUN 13; Creatinine, Ser 0.76; Hemoglobin 14.2; Platelets 235.0; Potassium 4.1; Sodium 140; TSH 3.38    Lipid Panel Lab Results  Component Value Date   CHOL 208 (H) 09/23/2017   HDL 72.00 09/23/2017   LDLCALC 119 (H) 09/23/2017   TRIG 87.0 09/23/2017      Wt Readings from Last 3 Encounters:  11/17/17 93 lb 8 oz (42.4 kg)  10/01/17 93 lb 12.8 oz (42.5 kg)  09/23/17 91 lb 3.2 oz (41.4 kg)       ASSESSMENT AND PLAN:  HYPERLIPIDEMIA TYPE IIB / III Cholesterol mildly elevated   no significant carotid or aortic atherosclerosis on ultrasound and CT scan,  Treat conservatively Very healthy otherwise  Atherosclerosis of both carotid arteries Intimal thickening No need for repeat scan She is not particularly interested in a statin  GERD Long discussion with her, she will consider starting PPI if she is unable to get rid of symptoms with H2 blocker She is concerned about long-term side effects of PPI Recommended follow-up  with primary care  Back pain Suspect unrelated to her GERD symptoms Likely more musculoskeletal, rhomboid muscle area Recommended conservative treatment, chiropractic, yoga light stretching   Total encounter time more than 25 minutes  Greater than 50% was spent in counseling and coordination of care with the patient   Disposition:   F/U  12 months as needed    Orders Placed This Encounter  Procedures  . EKG 12-Lead     Signed, Esmond Plants, M.D., Ph.D. 11/17/2017  [place], [place]

## 2017-11-16 ENCOUNTER — Telehealth: Payer: Self-pay

## 2017-11-16 NOTE — Telephone Encounter (Signed)
PA initiated via [facility] for Armour Thyroid 15mg  tabs #30 per 30d/approved from 1.1.19-3.10.2020 per [facility]/thx dmf

## 2017-11-17 ENCOUNTER — Ambulatory Visit (INDEPENDENT_AMBULATORY_CARE_PROVIDER_SITE_OTHER): Payer: Medicare Other | Admitting: Cardiovascular Disease

## 2017-11-17 ENCOUNTER — Encounter: Payer: Self-pay | Admitting: Cardiovascular Disease

## 2017-11-17 VITALS — BP 138/76 | HR 64 | Ht 62.5 in | Wt 93.5 lb

## 2017-11-17 DIAGNOSIS — I341 Nonrheumatic mitral (valve) prolapse: Secondary | ICD-10-CM

## 2017-11-17 DIAGNOSIS — M546 Pain in thoracic spine: Secondary | ICD-10-CM

## 2017-11-17 DIAGNOSIS — I739 Peripheral vascular disease, unspecified: Secondary | ICD-10-CM

## 2017-11-17 DIAGNOSIS — M549 Dorsalgia, unspecified: Secondary | ICD-10-CM | POA: Insufficient documentation

## 2017-11-17 DIAGNOSIS — I6523 Occlusion and stenosis of bilateral carotid arteries: Secondary | ICD-10-CM | POA: Diagnosis not present

## 2017-11-17 DIAGNOSIS — K219 Gastro-esophageal reflux disease without esophagitis: Secondary | ICD-10-CM | POA: Insufficient documentation

## 2017-11-17 DIAGNOSIS — E782 Mixed hyperlipidemia: Secondary | ICD-10-CM | POA: Diagnosis not present

## 2017-11-17 NOTE — Patient Instructions (Signed)
Medication Instructions:   No medication changes made  Try omeprazole  Labwork:  No new labs needed  Testing/Procedures:  No further testing at this time   Follow-Up: It was a pleasure seeing you in the office today. Please call us if you have new issues that need to be addressed before your next appt.  [phone]  Your physician wants you to follow-up in: 12 months as needed.  You will receive a reminder letter in the mail two months in advance. If you don't receive a letter, please call our office to schedule the follow-up appointment.  If you need a refill on your cardiac medications before your next appointment, please call your pharmacy.  For educational health videos Log in to : [email or web address] Or : [email or web address], password : [email or web address]

## 2017-11-19 DIAGNOSIS — M9903 Segmental and somatic dysfunction of lumbar region: Secondary | ICD-10-CM | POA: Diagnosis not present

## 2017-11-19 DIAGNOSIS — M9902 Segmental and somatic dysfunction of thoracic region: Secondary | ICD-10-CM | POA: Diagnosis not present

## 2017-11-19 DIAGNOSIS — M4716 Other spondylosis with myelopathy, lumbar region: Secondary | ICD-10-CM | POA: Diagnosis not present

## 2017-11-19 DIAGNOSIS — M5431 Sciatica, right side: Secondary | ICD-10-CM | POA: Diagnosis not present

## 2018-01-07 DIAGNOSIS — M9903 Segmental and somatic dysfunction of lumbar region: Secondary | ICD-10-CM | POA: Diagnosis not present

## 2018-01-07 DIAGNOSIS — M5431 Sciatica, right side: Secondary | ICD-10-CM | POA: Diagnosis not present

## 2018-01-07 DIAGNOSIS — M4716 Other spondylosis with myelopathy, lumbar region: Secondary | ICD-10-CM | POA: Diagnosis not present

## 2018-01-07 DIAGNOSIS — M9902 Segmental and somatic dysfunction of thoracic region: Secondary | ICD-10-CM | POA: Diagnosis not present

## 2018-02-09 DIAGNOSIS — M4716 Other spondylosis with myelopathy, lumbar region: Secondary | ICD-10-CM | POA: Diagnosis not present

## 2018-02-09 DIAGNOSIS — M9903 Segmental and somatic dysfunction of lumbar region: Secondary | ICD-10-CM | POA: Diagnosis not present

## 2018-02-09 DIAGNOSIS — M9902 Segmental and somatic dysfunction of thoracic region: Secondary | ICD-10-CM | POA: Diagnosis not present

## 2018-02-09 DIAGNOSIS — M5431 Sciatica, right side: Secondary | ICD-10-CM | POA: Diagnosis not present

## 2018-02-11 ENCOUNTER — Other Ambulatory Visit: Payer: Self-pay | Admitting: Family Medicine

## 2018-03-16 DIAGNOSIS — M9903 Segmental and somatic dysfunction of lumbar region: Secondary | ICD-10-CM | POA: Diagnosis not present

## 2018-03-16 DIAGNOSIS — M9902 Segmental and somatic dysfunction of thoracic region: Secondary | ICD-10-CM | POA: Diagnosis not present

## 2018-03-16 DIAGNOSIS — M5431 Sciatica, right side: Secondary | ICD-10-CM | POA: Diagnosis not present

## 2018-03-16 DIAGNOSIS — M4716 Other spondylosis with myelopathy, lumbar region: Secondary | ICD-10-CM | POA: Diagnosis not present

## 2018-04-06 ENCOUNTER — Telehealth: Payer: Self-pay | Admitting: Behavioral Health

## 2018-04-06 NOTE — Telephone Encounter (Signed)
Received Summary of Benefits for Prolia injection. The estimated out of pocket expense is $0. No PA is required. Medication has been ordered & currently available in office. Called to inform patient of benefits/cost & schedule next appointment; no answer at this time. Left message for patient to return call when available.

## 2018-04-06 NOTE — Telephone Encounter (Signed)
Discussed benefits & cost of Prolia injection with the patient. She voiced understanding. Next appointment has been scheduled for 04/08/2018 at 10:40 AM.

## 2018-04-08 ENCOUNTER — Ambulatory Visit (INDEPENDENT_AMBULATORY_CARE_PROVIDER_SITE_OTHER): Payer: Medicare Other | Admitting: Behavioral Health

## 2018-04-08 DIAGNOSIS — M810 Age-related osteoporosis without current pathological fracture: Secondary | ICD-10-CM

## 2018-04-08 MED ORDER — DENOSUMAB 60 MG/ML ~~LOC~~ SOSY
60.0000 mg | PREFILLED_SYRINGE | Freq: Once | SUBCUTANEOUS | Status: AC
  Administered 2018-04-08: 60 mg via SUBCUTANEOUS

## 2018-04-08 NOTE — Progress Notes (Signed)
Patient presents in clinic today for Prolia injection. SQ injection was given in the left arm. Patient tolerated it well. Prior to leaving the visit, there were no signs or symptoms of a reaction.

## 2018-04-27 DIAGNOSIS — M9902 Segmental and somatic dysfunction of thoracic region: Secondary | ICD-10-CM | POA: Diagnosis not present

## 2018-04-27 DIAGNOSIS — M4716 Other spondylosis with myelopathy, lumbar region: Secondary | ICD-10-CM | POA: Diagnosis not present

## 2018-04-27 DIAGNOSIS — M9903 Segmental and somatic dysfunction of lumbar region: Secondary | ICD-10-CM | POA: Diagnosis not present

## 2018-04-27 DIAGNOSIS — M5431 Sciatica, right side: Secondary | ICD-10-CM | POA: Diagnosis not present

## 2018-05-25 DIAGNOSIS — M9903 Segmental and somatic dysfunction of lumbar region: Secondary | ICD-10-CM | POA: Diagnosis not present

## 2018-05-25 DIAGNOSIS — M9902 Segmental and somatic dysfunction of thoracic region: Secondary | ICD-10-CM | POA: Diagnosis not present

## 2018-05-25 DIAGNOSIS — M5431 Sciatica, right side: Secondary | ICD-10-CM | POA: Diagnosis not present

## 2018-05-25 DIAGNOSIS — M4716 Other spondylosis with myelopathy, lumbar region: Secondary | ICD-10-CM | POA: Diagnosis not present

## 2018-06-18 ENCOUNTER — Ambulatory Visit: Payer: Self-pay

## 2018-06-18 NOTE — Telephone Encounter (Signed)
Patient called in with c/o "shoulder pain and dizziness." I asked what is the most concerning at this time, she says "dizziness, because the shoulder pain has been going on for a lot longer off and on. The dizziness has been off and on all this week. It was pretty bad yesterday, but it subsided. I thought maybe it's my ears, because the last time I was dizzy, my ears had wax buildup. I started using some drops last night. Today I felt it when I was doing yoga. I turned my head and felt dizzy a little." I asked is she dizzy now, she denies. I asked about other symptoms, she denies. According to protocol, see PCP within 3 days, patient asked to see Dr. Deborra Medina on Monday, I advised no open slots other than Same Day, but she can come to the Saturday Clinic at [facility] tomorrow, she wanted to see Dr. Deborra Medina. I called to the office and spoke to [facility] who says Dr. Deborra Medina is not there to ask if the slot can be opened for an appointment today, let the patient know to call back on early Monday or schedule an appointment on Wednesday. I advised the patient of the above from [place], patient says she will go the Saturday Clinic. Appointment scheduled for tomorrow at 1015 at [facility] with Dr. Maudie Mercury, care advice given, patient verbalized understanding. Patient says "I still would like to see Dr. Deborra Medina on Wednesday. I asked did she want to see her for the shoulder pain, she says yes. Appointment scheduled for Wednesday, 06/23/18 at 1200 with Dr. Deborra Medina.  Reason for Disposition . [1] MILD dizziness (e.g., walking normally) AND [2] has NOT been evaluated by physician for this  (Exception: dizziness caused by heat exposure, sudden standing, or poor fluid intake)  Answer Assessment - Initial Assessment Questions 1. DESCRIPTION: "Describe your dizziness."     Off balance, woozy 2. LIGHTHEADED: "Do you feel lightheaded?" (e.g., somewhat faint, woozy, weak upon standing)     Yes 3. VERTIGO: "Do you feel like either you or the room is spinning or  tilting?" (i.e. vertigo)     No 4. SEVERITY: "How bad is it?"  "Do you feel like you are going to faint?" "Can you stand and walk?"   - MILD - walking normally   - MODERATE - interferes with normal activities (e.g., work, school)    - SEVERE - unable to stand, requires support to walk, feels like passing out now.      Mild 5. ONSET:  "When did the dizziness begin?"     Off and on all this week 6. AGGRAVATING FACTORS: "Does anything make it worse?" (e.g., standing, change in head position)     It just happens 7. HEART RATE: "Can you tell me your heart rate?" "How many beats in 15 seconds?"  (Note: not all patients can do this)       No 8. CAUSE: "What do you think is causing the dizziness?"     I don't know 9. RECURRENT SYMPTOM: "Have you had dizziness before?" If so, ask: "When was the last time?" "What happened that time?"     Yes with my ear in the past 10. OTHER SYMPTOMS: "Do you have any other symptoms?" (e.g., fever, chest pain, vomiting, diarrhea, bleeding)       No 11. PREGNANCY: "Is there any chance you are pregnant?" "When was your last menstrual period?"       No  Protocols used: DIZZINESS [facility]

## 2018-06-19 ENCOUNTER — Ambulatory Visit: Payer: Medicare Other | Admitting: Family Medicine

## 2018-06-22 DIAGNOSIS — M9903 Segmental and somatic dysfunction of lumbar region: Secondary | ICD-10-CM | POA: Diagnosis not present

## 2018-06-22 DIAGNOSIS — M4716 Other spondylosis with myelopathy, lumbar region: Secondary | ICD-10-CM | POA: Diagnosis not present

## 2018-06-22 DIAGNOSIS — M9902 Segmental and somatic dysfunction of thoracic region: Secondary | ICD-10-CM | POA: Diagnosis not present

## 2018-06-22 DIAGNOSIS — M5431 Sciatica, right side: Secondary | ICD-10-CM | POA: Diagnosis not present

## 2018-06-23 ENCOUNTER — Ambulatory Visit: Payer: Medicare Other | Admitting: Family Medicine

## 2018-06-25 DIAGNOSIS — M4716 Other spondylosis with myelopathy, lumbar region: Secondary | ICD-10-CM | POA: Diagnosis not present

## 2018-06-25 DIAGNOSIS — M5431 Sciatica, right side: Secondary | ICD-10-CM | POA: Diagnosis not present

## 2018-06-25 DIAGNOSIS — M9903 Segmental and somatic dysfunction of lumbar region: Secondary | ICD-10-CM | POA: Diagnosis not present

## 2018-06-25 DIAGNOSIS — M9902 Segmental and somatic dysfunction of thoracic region: Secondary | ICD-10-CM | POA: Diagnosis not present

## 2018-06-26 DIAGNOSIS — Z23 Encounter for immunization: Secondary | ICD-10-CM | POA: Diagnosis not present

## 2018-07-13 DIAGNOSIS — M9902 Segmental and somatic dysfunction of thoracic region: Secondary | ICD-10-CM | POA: Diagnosis not present

## 2018-07-13 DIAGNOSIS — M9903 Segmental and somatic dysfunction of lumbar region: Secondary | ICD-10-CM | POA: Diagnosis not present

## 2018-07-13 DIAGNOSIS — M4716 Other spondylosis with myelopathy, lumbar region: Secondary | ICD-10-CM | POA: Diagnosis not present

## 2018-07-13 DIAGNOSIS — M5431 Sciatica, right side: Secondary | ICD-10-CM | POA: Diagnosis not present

## 2018-08-03 DIAGNOSIS — Z08 Encounter for follow-up examination after completed treatment for malignant neoplasm: Secondary | ICD-10-CM | POA: Diagnosis not present

## 2018-08-03 DIAGNOSIS — L820 Inflamed seborrheic keratosis: Secondary | ICD-10-CM | POA: Diagnosis not present

## 2018-08-03 DIAGNOSIS — L538 Other specified erythematous conditions: Secondary | ICD-10-CM | POA: Diagnosis not present

## 2018-08-03 DIAGNOSIS — L720 Epidermal cyst: Secondary | ICD-10-CM | POA: Diagnosis not present

## 2018-08-03 DIAGNOSIS — Z85828 Personal history of other malignant neoplasm of skin: Secondary | ICD-10-CM | POA: Diagnosis not present

## 2018-08-03 DIAGNOSIS — L298 Other pruritus: Secondary | ICD-10-CM | POA: Diagnosis not present

## 2018-08-03 DIAGNOSIS — X32XXXA Exposure to sunlight, initial encounter: Secondary | ICD-10-CM | POA: Diagnosis not present

## 2018-08-03 DIAGNOSIS — L570 Actinic keratosis: Secondary | ICD-10-CM | POA: Diagnosis not present

## 2018-08-10 DIAGNOSIS — M9902 Segmental and somatic dysfunction of thoracic region: Secondary | ICD-10-CM | POA: Diagnosis not present

## 2018-08-10 DIAGNOSIS — M9903 Segmental and somatic dysfunction of lumbar region: Secondary | ICD-10-CM | POA: Diagnosis not present

## 2018-08-10 DIAGNOSIS — M5431 Sciatica, right side: Secondary | ICD-10-CM | POA: Diagnosis not present

## 2018-08-10 DIAGNOSIS — M4716 Other spondylosis with myelopathy, lumbar region: Secondary | ICD-10-CM | POA: Diagnosis not present

## 2018-08-17 DIAGNOSIS — M9902 Segmental and somatic dysfunction of thoracic region: Secondary | ICD-10-CM | POA: Diagnosis not present

## 2018-08-17 DIAGNOSIS — M5431 Sciatica, right side: Secondary | ICD-10-CM | POA: Diagnosis not present

## 2018-08-17 DIAGNOSIS — M4716 Other spondylosis with myelopathy, lumbar region: Secondary | ICD-10-CM | POA: Diagnosis not present

## 2018-08-17 DIAGNOSIS — M9903 Segmental and somatic dysfunction of lumbar region: Secondary | ICD-10-CM | POA: Diagnosis not present

## 2018-09-14 DIAGNOSIS — M9903 Segmental and somatic dysfunction of lumbar region: Secondary | ICD-10-CM | POA: Diagnosis not present

## 2018-09-14 DIAGNOSIS — M5431 Sciatica, right side: Secondary | ICD-10-CM | POA: Diagnosis not present

## 2018-09-14 DIAGNOSIS — M4716 Other spondylosis with myelopathy, lumbar region: Secondary | ICD-10-CM | POA: Diagnosis not present

## 2018-09-14 DIAGNOSIS — M9902 Segmental and somatic dysfunction of thoracic region: Secondary | ICD-10-CM | POA: Diagnosis not present

## 2018-10-05 NOTE — Progress Notes (Signed)
Subjective:   Deanna Cobb is a 84 y.o. female who presents for Medicare Annual (Subsequent) preventive examination.  Pt enjoys playing golf with her husband.  Review of Systems: No ROS.  Medicare Wellness Visit. Additional risk factors are reflected in the social history. Cardiac Risk Factors include: advanced age (>34men, >14 women) Sleep patterns:no issues Home Safety/Smoke Alarms: Feels safe in home. Smoke alarms in place.  Lives with husband and 1 dog.  Female:        Mammo- 11/13/17      Dexa scan- ordered. Will do in march.             Objective:     Vitals: BP 138/82 (BP Location: Left Arm, Patient Position: Sitting, Cuff Size: Normal)   Pulse 65   Ht 5\' 3"  (1.6 m)   Wt 93 lb 12.8 oz (42.5 kg)   SpO2 97%   BMI 16.62 kg/m   Body mass index is 16.62 kg/m.  Advanced Directives 10/06/2018 09/23/2017 04/22/2016  Does Patient Have a Medical Advance Directive? Yes Yes No  Type of Paramedic of [place];Living will [place];Living will -  Does patient want to make changes to medical advance directive? No - Patient declined No - Patient declined -  Copy of [place] in Chart? Yes - validated most recent copy scanned in chart (See row information) Yes -    Tobacco Social History   Tobacco Use  Smoking Status Never Smoker  Smokeless Tobacco Never Used     Counseling given: Not Answered   Clinical Intake: Pain : No/denies pain   Past Medical History:  Diagnosis Date  . Accidental fall from chair   . Allergy    multiple food allergies  . Asthma    as a child  . Colitis 2012  . Coronary atherosclerosis    D/T lipid rich plaque  . GERD (gastroesophageal reflux disease)   . H. pylori infection 2012  . Heart murmur   . Hyperlipidemia   . Hypothyroidism   . Insomnia   . Internal hemorrhoids   . Mitral valve prolapse   . Osteoporosis   . Sarcoidosis 1966  . TIA (transient ischemic attack) 04/2005   . Visit for suture removal    Past Surgical History:  Procedure Laterality Date  . ABDOMINAL HYSTERECTOMY  1972  . LYMPH NODE BIOPSY  1966  . TONSILLECTOMY     Family History  Problem Relation Age of Onset  . Throat cancer Mother   . Alcohol abuse Brother   . Heart disease Sister    Social History   Socioeconomic History  . Marital status: Married    Spouse name: Not on file  . Number of children: 4  . Years of education: Not on file  . Highest education level: Not on file  Occupational History  . Occupation: Retired-RN  Scientific laboratory technician  . Financial resource strain: Not on file  . Food insecurity:    Worry: Not on file    Inability: Not on file  . Transportation needs:    Medical: Not on file    Non-medical: Not on file  Tobacco Use  . Smoking status: Never Smoker  . Smokeless tobacco: Never Used  Substance and Sexual Activity  . Alcohol use: Yes    Comment: 7 glasses of wine a week  . Drug use: No  . Sexual activity: Never  Lifestyle  . Physical activity:    Days per week:  Not on file    Minutes per session: Not on file  . Stress: Not on file  Relationships  . Social connections:    Talks on phone: Not on file    Gets together: Not on file    Attends religious service: Not on file    Active member of club or organization: Not on file    Attends meetings of clubs or organizations: Not on file    Relationship status: Not on file  Other Topics Concern  . Not on file  Social History Narrative   Does have a living will.   Would desire CPR but would not want prolonged life support.    Outpatient Encounter Medications as of 10/06/2018  Medication Sig  . ARMOUR THYROID 15 MG tablet TAKE 1 TABLET (15 MG TOTAL) BY MOUTH DAILY.  . cholecalciferol (VITAMIN D) 1000 UNITS tablet Take 1,000 Units by mouth daily as needed.   [name] (INTRINSI B12-FOLATE) 409-811-91 MCG-MCG-MG TABS Take 1 tablet by mouth daily.   Marland Kitchen MAGNESIUM PO Take 450 mg by  mouth.  . Multiple Vitamins-Minerals (ICAPS AREDS 2 PO) Take by mouth.  . Omega-3 Fatty Acids (EPA) 1000 MG CAPS Take 1 capsule by mouth 3 (three) times daily.   . Probiotic Product (PROBIOTIC ADVANCED PO) Take by mouth.  . TURMERIC PO Take by mouth daily.  . Alpha-Lipoic Acid 600 MG CAPS Take by mouth.  Marland Kitchen aspirin 81 MG tablet Take 81 mg by mouth daily.    . Coenzyme Q10 (COQ-10) 100 MG CAPS Take one by mouth daily  . Multiple Vitamins-Minerals (MULTIVITAMIN ADULTS PO) Take by mouth. Emtero Vite   Facility-Administered Encounter Medications as of 10/06/2018  Medication  . denosumab (PROLIA) injection 60 mg    Activities of Daily Living In your present state of health, do you have any difficulty performing the following activities: 10/06/2018  Hearing? Y  Comment pt saw ENT  last year and knows she needs hearing aids.  Vision? N  Difficulty concentrating or making decisions? N  Comment enjoys reading. In a book club.  Walking or climbing stairs? N  Dressing or bathing? N  Doing errands, shopping? N  Preparing Food and eating ? N  Using the Toilet? N  In the past six months, have you accidently leaked urine? N  Do you have problems with loss of bowel control? N  Managing your Medications? N  Managing your Finances? N  Housekeeping or managing your Housekeeping? N  Some recent data might be hidden    Patient Care Team: Lucille Passy, MD as PCP - General (Family Medicine) Richmond Campbell, MD as Consulting Physician (Gastroenterology) Barrie Dunker, MD as Consulting Physician (Dermatology) Lafayette Dragon, MD (Inactive) (Gastroenterology) Minna Merritts, MD as Consulting Physician (Cardiology)    Assessment:   This is a routine wellness examination for Deanna Cobb. Physical assessment deferred to PCP.  Exercise Activities and Dietary recommendations Current Exercise Habits: The patient does not participate in regular exercise at present;Home exercise routine, Type of  exercise: yoga;walking, Time (Minutes): 35, Frequency (Times/Week): 7, Weekly Exercise (Minutes/Week): 245, Intensity: Mild, Exercise limited by: None identified   Diet (meal preparation, eat out, water intake, caffeinated beverages, dairy products, fruits and vegetables): well balanced, on average, 3 meals per day   Goals    . Maintain active healthy lifestyle. (pt-stated)       Fall Risk Fall Risk  10/06/2018 09/23/2017 06/02/2016 05/30/2015  Falls in the past year? 0 No Yes No  Injury with Fall? - - Yes -  Risk for fall due to : - - Other (Comment) -    Depression Screen PHQ 2/9 Scores 10/06/2018 09/23/2017 06/02/2016 05/30/2015  PHQ - 2 Score 0 0 0 0     Cognitive Function Ad8 score reviewed for issues:  Issues making decisions:no  Less interest in hobbies / activities:no  Repeats questions, stories (family complaining):no  Trouble using ordinary gadgets (microwave, computer, phone):no  Forgets the month or year: no  Mismanaging finances: no  Remembering appts:no  Daily problems with thinking and/or memory:no Ad8 score is=0   MMSE - Mini Mental State Exam 09/23/2017  Orientation to time 5  Orientation to Place 5  Registration 3  Attention/ Calculation 5  Recall 2  Language- name 2 objects 2  Language- repeat 1  Language- follow 3 step command 3  Language- read & follow direction 1  Write a sentence 1  Copy design 1  Total score 29        Immunization History  Administered Date(s) Administered  . Influenza Split 06/11/2011, 06/15/2012  . Influenza, High Dose Seasonal PF 07/04/2017, 06/26/2018  . Influenza,inj,Quad PF,6+ Mos 05/11/2014, 06/02/2016  . Pneumococcal Conjugate-13 05/11/2014  . Pneumococcal Polysaccharide-23 03/21/2009  . Td 03/29/2009  . Zoster 03/16/2008   Screening Tests Health Maintenance  Topic Date Due  . TETANUS/TDAP  03/30/2019  . INFLUENZA VACCINE  Completed  . DEXA SCAN  Completed  . PNA vac Low Risk Adult  Completed        Plan:    Please schedule your next medicare wellness visit with me in 1 yr.  Continue to eat healthy.  Continue to exercise.  Continue doing brain stimulating activities to keep memory sharp.   Please schedule mammogram and bone density on or after March 9th.  I have personally reviewed and noted the following in the patient's chart:   . Medical and social history . Use of alcohol, tobacco or illicit drugs  . Current medications and supplements . Functional ability and status . Nutritional status . Physical activity . Advanced directives . List of other physicians . Hospitalizations, surgeries, and ER visits in previous 12 months . Vitals . Screenings to include cognitive, depression, and falls . Referrals and appointments  In addition, I have reviewed and discussed with patient certain preventive protocols, quality metrics, and best practice recommendations. A written personalized care plan for preventive services as well as general preventive health recommendations were provided to patient.     Shela Nevin, [place]  10/06/2018

## 2018-10-06 ENCOUNTER — Ambulatory Visit (INDEPENDENT_AMBULATORY_CARE_PROVIDER_SITE_OTHER): Payer: Medicare Other | Admitting: *Deleted

## 2018-10-06 ENCOUNTER — Other Ambulatory Visit: Payer: Self-pay

## 2018-10-06 ENCOUNTER — Encounter: Payer: Self-pay | Admitting: *Deleted

## 2018-10-06 VITALS — BP 138/82 | HR 65 | Ht 63.0 in | Wt 93.8 lb

## 2018-10-06 DIAGNOSIS — E039 Hypothyroidism, unspecified: Secondary | ICD-10-CM | POA: Diagnosis not present

## 2018-10-06 DIAGNOSIS — Z0000 Encounter for general adult medical examination without abnormal findings: Secondary | ICD-10-CM

## 2018-10-06 DIAGNOSIS — Z780 Asymptomatic menopausal state: Secondary | ICD-10-CM

## 2018-10-06 DIAGNOSIS — M810 Age-related osteoporosis without current pathological fracture: Secondary | ICD-10-CM

## 2018-10-06 DIAGNOSIS — E782 Mixed hyperlipidemia: Secondary | ICD-10-CM

## 2018-10-06 LAB — COMPREHENSIVE METABOLIC PANEL
ALK PHOS: 41 U/L (ref 39–117)
ALT: 17 U/L (ref 0–35)
AST: 19 U/L (ref 0–37)
Albumin: 4.3 g/dL (ref 3.5–5.2)
BILIRUBIN TOTAL: 0.9 mg/dL (ref 0.2–1.2)
BUN: 12 mg/dL (ref 6–23)
CALCIUM: 9.7 mg/dL (ref 8.4–10.5)
CO2: 31 mEq/L (ref 19–32)
Chloride: 103 mEq/L (ref 96–112)
Creatinine, Ser: 0.66 mg/dL (ref 0.40–1.20)
GFR: 85.74 mL/min (ref >60.00)
Glucose, Bld: 97 mg/dL (ref 70–99)
Potassium: 5.2 mEq/L — ABNORMAL HIGH (ref 3.5–5.1)
Sodium: 140 mEq/L (ref 135–145)
Total Protein: 6.5 g/dL (ref 6.0–8.3)

## 2018-10-06 LAB — CBC WITH DIFFERENTIAL/PLATELET
Basophils Absolute: 0 10*3/uL (ref 0.0–0.1)
Basophils Relative: 0.6 % (ref 0.0–3.0)
Eosinophils Absolute: 0.1 10*3/uL (ref 0.0–0.7)
Eosinophils Relative: 0.9 % (ref 0.0–5.0)
HCT: 42.9 % (ref 36.0–46.0)
Hemoglobin: 14.5 g/dL (ref 12.0–15.0)
LYMPHS PCT: 27 % (ref 12.0–46.0)
Lymphs Abs: 1.8 10*3/uL (ref 0.7–4.0)
MCHC: 33.8 g/dL (ref 30.0–36.0)
MCV: 96.8 fl (ref 78.0–100.0)
Monocytes Absolute: 0.5 10*3/uL (ref 0.1–1.0)
Monocytes Relative: 6.8 % (ref 3.0–12.0)
Neutro Abs: 4.3 10*3/uL (ref 1.4–7.7)
Neutrophils Relative %: 64.7 % (ref 43.0–77.0)
Platelets: 261 10*3/uL (ref 150.0–400.0)
RBC: 4.43 Mil/uL (ref 3.87–5.11)
RDW: 12.9 % (ref 11.5–15.5)
WBC: 6.7 10*3/uL (ref 4.0–10.5)

## 2018-10-06 LAB — VITAMIN D 25 HYDROXY (VIT D DEFICIENCY, FRACTURES): VITD: 43.59 ng/mL (ref 30.00–100.00)

## 2018-10-06 LAB — LIPID PANEL
Cholesterol: 214 mg/dL — ABNORMAL HIGH (ref 0–200)
HDL: 60.1 mg/dL (ref >39.00)
LDL Cholesterol: 136 mg/dL — ABNORMAL HIGH (ref 0–99)
NonHDL: 153.43
TRIGLYCERIDES: 85 mg/dL (ref 0.0–149.0)
Total CHOL/HDL Ratio: 4
VLDL: 17 mg/dL (ref 0.0–40.0)

## 2018-10-06 LAB — T4, FREE: Free T4: 0.81 ng/dL (ref 0.60–1.60)

## 2018-10-06 LAB — TSH: TSH: 1.66 u[IU]/mL (ref 0.35–4.50)

## 2018-10-06 NOTE — Patient Instructions (Signed)
Please schedule your next medicare wellness visit with me in 1 yr.  Continue to eat healthy.  Continue to exercise.  Continue doing brain stimulating activities to keep memory sharp.   Please schedule mammogram and bone density on or after March 9th.   Deanna Cobb , Thank you for taking time to come for your Medicare Wellness Visit. I appreciate your ongoing commitment to your health goals. Please review the following plan we discussed and let me know if I can assist you in the future.   These are the goals we discussed: Goals    . Maintain active healthy lifestyle. (pt-stated)       This is a list of the screening recommended for you and due dates:  Health Maintenance  Topic Date Due  . Tetanus Vaccine  03/30/2019  . Flu Shot  Completed  . DEXA scan (bone density measurement)  Completed  . Pneumonia vaccines  Completed    Health Maintenance After Age 23 After age 69, you are at a higher risk for certain long-term diseases and infections as well as injuries from falls. Falls are a major cause of broken bones and head injuries in people who are older than age 53. Getting regular preventive care can help to keep you healthy and well. Preventive care includes getting regular testing and making lifestyle changes as recommended by your health care provider. Talk with your health care provider about:  Which screenings and tests you should have. A screening is a test that checks for a disease when you have no symptoms.  A diet and exercise plan that is right for you. What should I know about screenings and tests to prevent falls? Screening and testing are the best ways to find a health problem early. Early diagnosis and treatment give you the best chance of managing medical conditions that are common after age 26. Certain conditions and lifestyle choices may make you more likely to have a fall. Your health care provider may recommend:  Regular vision checks. Poor vision and conditions  such as cataracts can make you more likely to have a fall. If you wear glasses, make sure to get your prescription updated if your vision changes.  Medicine review. Work with your health care provider to regularly review all of the medicines you are taking, including over-the-counter medicines. Ask your health care provider about any side effects that may make you more likely to have a fall. Tell your health care provider if any medicines that you take make you feel dizzy or sleepy.  Osteoporosis screening. Osteoporosis is a condition that causes the bones to get weaker. This can make the bones weak and cause them to break more easily.  Blood pressure screening. Blood pressure changes and medicines to control blood pressure can make you feel dizzy.  Strength and balance checks. Your health care provider may recommend certain tests to check your strength and balance while standing, walking, or changing positions.  Foot health exam. Foot pain and numbness, as well as not wearing proper footwear, can make you more likely to have a fall.  Depression screening. You may be more likely to have a fall if you have a fear of falling, feel emotionally low, or feel unable to do activities that you used to do.  Alcohol use screening. Using too much alcohol can affect your balance and may make you more likely to have a fall. What actions can I take to lower my risk of falls? General instructions  Talk with  your health care provider about your risks for falling. Tell your health care provider if: ? You fall. Be sure to tell your health care provider about all falls, even ones that seem minor. ? You feel dizzy, sleepy, or off-balance.  Take over-the-counter and prescription medicines only as told by your health care provider. These include any supplements.  Eat a healthy diet and maintain a healthy weight. A healthy diet includes low-fat dairy products, low-fat (lean) meats, and fiber from whole grains,  beans, and lots of fruits and vegetables. Home safety  Remove any tripping hazards, such as rugs, cords, and clutter.  Install safety equipment such as grab bars in bathrooms and safety rails on stairs.  Keep rooms and walkways well-lit. Activity   Follow a regular exercise program to stay fit. This will help you maintain your balance. Ask your health care provider what types of exercise are appropriate for you.  If you need a cane or walker, use it as recommended by your health care provider.  Wear supportive shoes that have nonskid soles. Lifestyle  Do not drink alcohol if your health care provider tells you not to drink.  If you drink alcohol, limit how much you have: ? 0-1 drink a day for women. ? 0-2 drinks a day for men.  Be aware of how much alcohol is in your drink. In the [place] one drink equals one typical bottle of beer (12 oz), one-half glass of wine (5 oz), or one shot of hard liquor (1 oz).  Do not use any products that contain nicotine or tobacco, such as cigarettes and e-cigarettes. If you need help quitting, ask your health care provider. Summary  Having a healthy lifestyle and getting preventive care can help to protect your health and wellness after age 40.  Screening and testing are the best way to find a health problem early and help you avoid having a fall. Early diagnosis and treatment give you the best chance for managing medical conditions that are more common for people who are older than age 48.  Falls are a major cause of broken bones and head injuries in people who are older than age 47. Take precautions to prevent a fall at home.  Work with your health care provider to learn what changes you can make to improve your health and wellness and to prevent falls. This information is not intended to replace advice given to you by your health care provider. Make sure you discuss any questions you have with your health care provider. Document Released:  07/08/2017 Document Revised: 07/08/2017 Document Reviewed: 07/08/2017 Elsevier Interactive Patient Education  2019 [employer].

## 2018-10-06 NOTE — Progress Notes (Signed)
I reviewed health advisor's note, was available for consultation, and agree with documentation and plan.  

## 2018-10-06 NOTE — Addendum Note (Signed)
Addended by: Diona Foley on: 10/06/2018 11:00 AM   Modules accepted: Orders

## 2018-10-07 LAB — T3: T3 TOTAL: 122 ng/dL (ref 76–181)

## 2018-10-13 ENCOUNTER — Other Ambulatory Visit: Payer: Self-pay | Admitting: Family Medicine

## 2018-10-13 ENCOUNTER — Telehealth: Payer: Self-pay | Admitting: Behavioral Health

## 2018-10-13 DIAGNOSIS — Z1231 Encounter for screening mammogram for malignant neoplasm of breast: Secondary | ICD-10-CM

## 2018-10-13 NOTE — Telephone Encounter (Signed)
Received Summary of Benefits for Prolia injection. No PA is required. The estimated out of pocket expense is $0. Medication has been ordered from pharmacy.

## 2018-10-14 ENCOUNTER — Encounter: Payer: Self-pay | Admitting: Family Medicine

## 2018-10-14 ENCOUNTER — Ambulatory Visit (INDEPENDENT_AMBULATORY_CARE_PROVIDER_SITE_OTHER): Payer: Medicare Other | Admitting: Family Medicine

## 2018-10-14 VITALS — BP 102/80 | HR 72 | Temp 97.2°F | Ht 63.0 in | Wt 96.8 lb

## 2018-10-14 DIAGNOSIS — E039 Hypothyroidism, unspecified: Secondary | ICD-10-CM | POA: Diagnosis not present

## 2018-10-14 DIAGNOSIS — E559 Vitamin D deficiency, unspecified: Secondary | ICD-10-CM | POA: Diagnosis not present

## 2018-10-14 DIAGNOSIS — E782 Mixed hyperlipidemia: Secondary | ICD-10-CM

## 2018-10-14 DIAGNOSIS — I6523 Occlusion and stenosis of bilateral carotid arteries: Secondary | ICD-10-CM

## 2018-10-14 DIAGNOSIS — M546 Pain in thoracic spine: Secondary | ICD-10-CM | POA: Diagnosis not present

## 2018-10-14 DIAGNOSIS — M810 Age-related osteoporosis without current pathological fracture: Secondary | ICD-10-CM

## 2018-10-14 NOTE — Assessment & Plan Note (Signed)
Clinically euthyroid on current dose of armour. No changes made today.

## 2018-10-14 NOTE — Assessment & Plan Note (Signed)
Vitamin D normal this month.  Continue current OTC dose.

## 2018-10-14 NOTE — Patient Instructions (Addendum)
Great to see you. Happy birthday!  [phone]

## 2018-10-14 NOTE — Progress Notes (Signed)
HPI Deanna Cobb is a very pleasant 84 yo female here today for  follow up of chronic medical conditions.  Medicare annual wellness visit with Derwood Kaplan, RN on 10/06/18. Note reviewed.  Hypothyroidism- she is taking armour 15 mg daily.  Lab Results  Component Value Date   TSH 1.66 10/06/2018   T3TOTAL 122 10/06/2018   Vitamin D deficiency- Vit D was 43.59 last week.  Followed by cardiology for management of her history of a TIA , mitral valve prolapse, and hyperlipidemia. Taking low-dose aspirin.  She stopped taking Crestor because she felt it was making her "mind cloudy.  Taking red yeast rice.  Sees Dr. Rockey Situ- last saw him on 11/17/17.  Note reviewed.  She is doing a [place] diet so has lost a few pounds.  His assessment was as follows:  HYPERLIPIDEMIA TYPE IIB / III Cholesterol mildly elevated   no significant carotid or aortic atherosclerosis on ultrasound and CT scan,  Treat conservatively Very healthy otherwise  Atherosclerosis of both carotid arteries Intimal thickening No need for repeat scan    Wt Readings from Last 3 Encounters:  10/14/18 96 lb 12.8 oz (43.9 kg)  10/06/18 93 lb 12.8 oz (42.5 kg)  11/17/17 93 lb 8 oz (42.4 kg)    Lab Results  Component Value Date   CHOL 214 (H) 10/06/2018   HDL 60.10 10/06/2018   LDLCALC 136 (H) 10/06/2018   LDLDIRECT 109.0 06/15/2012   TRIG 85.0 10/06/2018   CHOLHDL 4 10/06/2018    Hypothyroidism- on armour.  Denies symptoms of hypo or hyperthyroidism.    Lab Results  Component Value Date   TSH 1.66 10/06/2018    Osteoporosis- DEXA and mammogram scheduled for 12/06/18.  Due for another injection today.    Past Medical History:  Diagnosis Date  . Accidental fall from chair   . Allergy    multiple food allergies  . Asthma    as a child  . Colitis 2012  . Coronary atherosclerosis    D/T lipid rich plaque  . GERD (gastroesophageal reflux disease)   . H. pylori infection 2012  . Heart murmur   .  Hyperlipidemia   . Hypothyroidism   . Insomnia   . Internal hemorrhoids   . Mitral valve prolapse   . Osteoporosis   . Sarcoidosis 1966  . TIA (transient ischemic attack) 04/2005  . Visit for suture removal     Past Surgical History:  Procedure Laterality Date  . ABDOMINAL HYSTERECTOMY  1972  . LYMPH NODE BIOPSY  1966  . TONSILLECTOMY      Family History  Problem Relation Age of Onset  . Throat cancer Mother   . Alcohol abuse Brother   . Heart disease Sister     Social History   Socioeconomic History  . Marital status: Married    Spouse name: Not on file  . Number of children: 4  . Years of education: Not on file  . Highest education level: Not on file  Occupational History  . Occupation: Retired-RN  Scientific laboratory technician  . Financial resource strain: Not on file  . Food insecurity:    Worry: Not on file    Inability: Not on file  . Transportation needs:    Medical: Not on file    Non-medical: Not on file  Tobacco Use  . Smoking status: Never Smoker  . Smokeless tobacco: Never Used  Substance and Sexual Activity  . Alcohol use: Yes    Comment: 7 glasses of wine  a week  . Drug use: No  . Sexual activity: Never  Lifestyle  . Physical activity:    Days per week: Not on file    Minutes per session: Not on file  . Stress: Not on file  Relationships  . Social connections:    Talks on phone: Not on file    Gets together: Not on file    Attends religious service: Not on file    Active member of club or organization: Not on file    Attends meetings of clubs or organizations: Not on file    Relationship status: Not on file  . Intimate partner violence:    Fear of current or ex partner: Not on file    Emotionally abused: Not on file    Physically abused: Not on file    Forced sexual activity: Not on file  Other Topics Concern  . Not on file  Social History Narrative   Does have a living will.   Would desire CPR but would not want prolonged life support.     Allergies  Allergen Reactions  . Clindamycin/Lincomycin   . Penicillins     REACTION: hives    Current Outpatient Medications  Medication Sig Dispense Refill  . ARMOUR THYROID 15 MG tablet TAKE 1 TABLET (15 MG TOTAL) BY MOUTH DAILY. 90 tablet 2  . cholecalciferol (VITAMIN D) 1000 UNITS tablet Take 1,000 Units by mouth daily as needed.     . Coenzyme Q10 (COQ-10) 100 MG CAPS Take one by mouth daily    . Folate-B12-Intrinsic Factor (INTRINSI B12-FOLATE) 277-824-23 MCG-MCG-MG TABS Take 1 tablet by mouth daily.     Marland Kitchen MAGNESIUM PO Take 450 mg by mouth.    . Multiple Vitamins-Minerals (ICAPS AREDS 2 PO) Take by mouth.    . Multiple Vitamins-Minerals (MULTIVITAMIN ADULTS PO) Take by mouth. Emtero Vite    . Omega-3 Fatty Acids (EPA) 1000 MG CAPS Take 1 capsule by mouth 3 (three) times daily.     . Probiotic Product (PROBIOTIC ADVANCED PO) Take by mouth.    . TURMERIC PO Take by mouth daily.     Current Facility-Administered Medications  Medication Dose Route Frequency Provider Last Rate Last Dose  . denosumab (PROLIA) injection 60 mg  60 mg Subcutaneous Q6 months Lucille Passy, MD   60 mg at 08/27/16 1640    Review of Systems  Constitutional: Negative.   HENT: Negative for hearing loss.   Eyes: Negative.   Respiratory: Negative.   Cardiovascular: Negative.   Gastrointestinal: Negative.   Endocrine: Negative.   Genitourinary: Negative.   Musculoskeletal: Negative for back pain.  Allergic/Immunologic: Negative.   Neurological: Negative.   Hematological: Negative.   Psychiatric/Behavioral: Negative.   All other systems reviewed and are negative.     Physical exam  BP 102/80 (BP Location: Left Arm, Patient Position: Sitting, Cuff Size: Normal)   Pulse 72   Temp (!) 97.2 F (36.2 C) (Oral)   Ht 5\' 3"  (1.6 m)   Wt 96 lb 12.8 oz (43.9 kg)   SpO2 98%   BMI 17.15 kg/m    Wt Readings from Last 3 Encounters:  10/14/18 96 lb 12.8 oz (43.9 kg)  10/06/18 93 lb 12.8 oz (42.5  kg)  11/17/17 93 lb 8 oz (42.4 kg)    Physical Exam  Constitutional: She is oriented to person, place, and time and well-developed, well-nourished, and in no distress. No distress.  HENT:  Head: Normocephalic and atraumatic.  Eyes: Conjunctivae are  normal.  Neck: Normal range of motion. Neck supple.  Cardiovascular: Normal rate, regular rhythm and normal heart sounds.  Pulmonary/Chest: Effort normal and breath sounds normal.  Abdominal: Soft. Bowel sounds are normal.  Musculoskeletal: Normal range of motion.        General: No tenderness or edema.  Neurological: She is alert and oriented to person, place, and time.  Skin: Skin is warm and dry. She is not diaphoretic.  Psychiatric: Mood, memory, affect and judgment normal.  Nursing note and vitals reviewed.

## 2018-10-14 NOTE — Addendum Note (Signed)
Addended by: Lucille Passy on: 10/14/2018 10:35 AM   Modules accepted: Orders

## 2018-10-14 NOTE — Assessment & Plan Note (Signed)
Discussed tx.  She would like a PT referral to Dr. Shelton Silvas  [address] in [place] [phone]  Referral placed.

## 2018-10-14 NOTE — Assessment & Plan Note (Signed)
IM Prolia given today. DEXA scheduled.

## 2018-10-14 NOTE — Assessment & Plan Note (Signed)
No significant carotid or aortic atherosclerosis on CT. She is not taking a statin.  Also followed by cardiology.

## 2018-10-18 NOTE — Telephone Encounter (Signed)
Patient was made aware of the below benefits. She voiced understanding. Nurse visit appointment has been scheduled for 10/21/2018 at 10:40 AM for Prolia injection.

## 2018-10-21 ENCOUNTER — Ambulatory Visit: Payer: Medicare Other

## 2018-10-26 DIAGNOSIS — M9903 Segmental and somatic dysfunction of lumbar region: Secondary | ICD-10-CM | POA: Diagnosis not present

## 2018-10-26 DIAGNOSIS — M4716 Other spondylosis with myelopathy, lumbar region: Secondary | ICD-10-CM | POA: Diagnosis not present

## 2018-10-26 DIAGNOSIS — M5431 Sciatica, right side: Secondary | ICD-10-CM | POA: Diagnosis not present

## 2018-10-26 DIAGNOSIS — M9902 Segmental and somatic dysfunction of thoracic region: Secondary | ICD-10-CM | POA: Diagnosis not present

## 2018-10-28 ENCOUNTER — Ambulatory Visit (INDEPENDENT_AMBULATORY_CARE_PROVIDER_SITE_OTHER): Payer: Medicare Other | Admitting: Behavioral Health

## 2018-10-28 DIAGNOSIS — M810 Age-related osteoporosis without current pathological fracture: Secondary | ICD-10-CM | POA: Diagnosis not present

## 2018-10-28 MED ORDER — DENOSUMAB 60 MG/ML ~~LOC~~ SOSY
60.0000 mg | PREFILLED_SYRINGE | Freq: Once | SUBCUTANEOUS | Status: AC
  Administered 2018-10-28: 60 mg via SUBCUTANEOUS

## 2018-10-28 NOTE — Progress Notes (Addendum)
Patient came in clinic today for Prolia injection. SQ injection was given in the left arm. Patient tolerated it well. There were no signs or symptoms of a reaction prior to patient leaving the nurse visit.  Note reviewed and agree with above injection

## 2018-11-19 ENCOUNTER — Other Ambulatory Visit: Payer: Self-pay | Admitting: Family Medicine

## 2018-11-23 ENCOUNTER — Other Ambulatory Visit: Payer: Self-pay

## 2018-11-23 ENCOUNTER — Ambulatory Visit: Payer: Medicare Other | Attending: Family Medicine | Admitting: Physical Therapy

## 2018-11-23 ENCOUNTER — Encounter: Payer: Self-pay | Admitting: Physical Therapy

## 2018-11-23 DIAGNOSIS — M6281 Muscle weakness (generalized): Secondary | ICD-10-CM

## 2018-11-23 DIAGNOSIS — M545 Low back pain: Secondary | ICD-10-CM | POA: Insufficient documentation

## 2018-11-23 DIAGNOSIS — G8929 Other chronic pain: Secondary | ICD-10-CM

## 2018-11-23 NOTE — Therapy (Signed)
[facility] [address], [place], [address] Phone: [phone]   Fax:  [phone]  Physical Therapy Evaluation  Patient Details  Name: Deanna Cobb MRN: [id number] Date of Birth: 05/13/1935 Referring Provider (PT): Lucille Passy, MD   Encounter Date: 11/23/2018    PT End of Session - 11/23/18 1307    Visit Number  1    Number of Visits  12    Date for PT Re-Evaluation  01/04/19    Authorization Type  Medicare reporting period from 11/23/2018    Authorization Time Period  Current cert period: 5/36/1443 - 01/04/2019 (Last PN: IE 11/23/2018)    Authorization - Visit Number  1    Authorization - Number of Visits  12    PT Start Time  0930    PT Stop Time  1030    PT Time Calculation (min)  60 min    Activity Tolerance  Patient tolerated treatment well    Behavior During Therapy  [facility] for tasks assessed/performed       Past Medical History:  Diagnosis Date  . Accidental fall from chair   . Allergy    multiple food allergies  . Asthma    as a child  . Colitis 2012  . Coronary atherosclerosis    D/T lipid rich plaque  . GERD (gastroesophageal reflux disease)   . H. pylori infection 2012  . Heart murmur   . Hyperlipidemia   . Hypothyroidism   . Insomnia   . Internal hemorrhoids   . Mitral valve prolapse   . Osteoporosis   . Sarcoidosis 1966  . TIA (transient ischemic attack) 04/2005  . Visit for suture removal     Past Surgical History:  Procedure Laterality Date  . ABDOMINAL HYSTERECTOMY  1972  . LYMPH NODE BIOPSY  1966  . TONSILLECTOMY      There were no vitals filed for this visit.   Subjective Assessment - 11/23/18 0957    Subjective  Patient reports her pain is in her lower back .She had a disc problem 4-5 years ago and since then her back has never been quite the same. She does yoga regularly. In certain movements it still bothers her. She wonders if there are any exercises she could  do that she hasn't been doing to help it feel better. She states she has difficulty picking things up from the floor because she has to touch the ground to get back up and she would like to improve that. She sees a Restaurant manager, fast food monthly and used to go several times a week. He thinks she is compensating after a history of an injury to the right leg. She fell down some steps and it took a layer of skin about 4 years ago. She also injured her left leg. Her puppy cut that leg 3-4 years ago. She believes both injuries were superficial. She originally hurt her back before that. 20 years ago she moved a box that was too heavy. Seeing a chiropractor for a year helped but has not been quite the same. This past time it flared up she is not sure. She had sciatica and "the whole 9 yards" that time into her right leg. She got acupuncture, all kinds of exercises at home, and used several machines. She never gets pain in the leg now and is on maintenance schedule.     Pertinent History  Patient is a 84 y.o. female who presents to outpatient  physical therapy with a referral for medical diagnosis acute right-sided thoracic back pain. This patient's chief complaints consist of right sided low back pain that radiates into her glute region, leading to the following functional deficits: difficulty with prolonged standing, prolonged sitting, completing tasks in a slightly stooped position, moving into and out of flexion, picking something up off the floor without pushing off with hands. Relevant past medical history and comorbidities include osteoporosis (fell and broke left patella, later had stress fracture on top of foot, 10 years ago or so), history of TIA (about 15-20 years ago, affected vision, no lingering effects), history of sarcoidosis (years ago, no problems).    Limitations  Sitting;House hold activities;Standing;Other (comment)   ifficulty with prolonged standing, prolonged sitting, completing tasks in a slightly stooped  position, moving into and out of flexion, picking something up off the floor without pushing off with hands.   How long can you sit comfortably?  1 hour    How long can you stand comfortably?  2 hours    How long can you walk comfortably?  miles     Diagnostic tests  see chart for latest bone density scan    Patient Stated Goals  be able to pick things up off the floor of the grocery store without needing to push off with hands. move better with less pain.     Currently in Pain?  Yes    Pain Score  1     Pain Location  Back    right low back and upper glute, occasionally left upper glute   Pain Orientation  Lower;Right    Pain Descriptors / Indicators  Aching   more achy most of the time, used to be sharp.    Pain Type  Chronic pain    Pain Radiating Towards  history of right sided radiculopathy to foot.     Pain Onset  More than a month ago    Pain Frequency  Intermittent    Aggravating Factors   worse in the mornings (if she moves she is fine after an hour);inactivity, standing at a low sink bending slightly, picking things up off the floor (without using hands), prolonged standing while painting (upt to 2 hours), prolonged sitting in sofa or soft chair (up to 1 hour).  Passing through flexion at about 60 degrees (ascending and descending).     Pain Relieving Factors  better when on the move, hard chair, yoga, walking.    Effect of Pain on Daily Activities   difficulty with prolonged standing, prolonged sitting, completing tasks in a slightly stooped position, moving into and out of flexion, picking something up off the floor without pushing off with hands.         [facility] PT Assessment - 11/23/18 0001      Assessment   Medical Diagnosis  Acute right-sided thoracic back pain    Referring Provider (PT)  Lucille Passy, MD    Next MD Visit  none    Prior Therapy  chropractic helped significantly. Has not been to PT for this problem      Precautions   Precautions  Other (comment);Fall    osteoporosis     Restrictions   Weight Bearing Restrictions  No      Balance Screen   Has the patient fallen in the past 6 months  No    Has the patient had a decrease in activity level because of a fear of falling?   No  Is the patient reluctant to leave their home because of a fear of falling?   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   NEUROLOGICAL: Dermatomes: BLE equal and intact to light touch.  Myotomes: BLE WNL. Reflexes:  - Quadriceps reflex (L4): B = 2+ - Achilles reflex (S1): R = 2+, L = 1+. Upper Motor Neuron Screen: Hoffman's, and Clonus (ankle) negative bilaterally.  SPINE MOTION Lumbar AROM:  *Indicates pain - Flexion: = fingers to floor, painful passing trough 60 . - Extension: = 25% (painful end range - "I have never been able to do back bends"). - Rotation:  B = WNL (feels in right glute both ways) - Side Flexion: B = finger tip 17 inches from floor  PERIPHERAL JOINT MOTION (AROM/PROM in degrees):  *Indicates pain - Hip: Left slightly less flexible than right. B = WNL or overall hypermobile Knee: B = appear WNL Ankle: B =  appear WNL  STRENGTH:  *Indicates pain Hip  - Flexion: R = 3+/5, L = 3+/5. - Extension: R = 4+/5, L = 4+/5. - Abduction: R = 4+/5, L = 5-/5. Knee - Ext: R = 4+/5, L = 4+/5. - Flex: R = 4+/5, L = 4+/5. Ankle (seated position) - Dorsiflexion: R = 4+/5, L = 4+/5. - Plantarflexion: R = 4+/5, L = 4+/5. - Eversion: R = 4+/5, L = 4+/5. - Great toe extension: R = 4+/5, L = 4+/5. - Able to heel and toe walk without difficulty and no UE support  REPEATED MOTIONS TESTING: Standing lumbar extension x 3: uncomfortable, discontinued Prone lumbar extension x 10 during = no effect; after = no apparent effect  SPECIAL TESTS: Straight leg raise (SLR): B = positive to sensitizing maneuver for posterior thigh tension, no reproduction of chief complaint.  FABER: R = WNL, L = WNL slightly more limited in motion compared to right.  ACCESSORY MOTION:  - hypomobile to CPA throughout lower thoracic and lumbar spine - Tender to CPA at lowest 3 segments of lumbar spine, most caudal is the worst  PALPATION: - TTP at right glute/piriformis region compared to left.  - TTP at right lowest paraspinals and multifidi  FUNCTIONAL MOBILITY: - Bed mobility: I - Transfers: I  - Gait: I WNL - Stairs: not observed  FUNCTIONAL/BALANCE TESTS: 5TSTS: 14 seconds no UE support from low plinth  Objective measurements completed on examination: See above findings.     TREATMENT:  Denies knowledge of sensitivity to latex Denies history of spinal surgery Deneis long term use of steroids.  CONFIRMS OSTEOPOROSIS  Therapeutic exercise: to centralize symptoms and improve ROM, strength, muscular endurance, and activity tolerance required for successful completion of functional activities.  - sit <> stand with buttocks tap over low plinth with isometric hip abduction with red theraband around knees to improve glute activation and no UE support. To improve posterior chain strength and hip strength. Cuing for full hip  extension and neutral spine. 2x10.  - Standing pallof press (multifidus press) with red theraband x10 each side. Cuing for trunk activation and instructions on how to complete and purpose of exercise. To improve strength of anti rotational lumbar stabilization muscles. - Education on HEP including handout  - Education on diagnosis, prognosis, POC, anatomy and physiology of current condition.    HOME EXERCISE PROGRAM Access Code: [email or web address]  URL: [email or web address]  Date: 11/23/2018  Prepared by: Rosita Kea   Exercises  Sit to Stand with Resistance Around Legs - 3 sets - 10 reps - 2x daily  Standing Anti-Rotation Press with Anchored Resistance - 2-3 sets - 10 reps - 1-2x daily   Patient response to treatment:  Pt tolerated treatment well. Pt was able to complete all exercises with minimal to no lasting increase in pain or discomfort. Pt required multimodal cuing for proper technique and to facilitate improved neuromuscular control, strength, range of motion, and functional ability resulting in improved performance and form.     PT Education - 11/23/18 1306    Education Details  Exercise purpose/form. Self management techniques. Education on diagnosis, prognosis, POC, anatomy and physiology of current condition Education on HEP including handout     Person(s) Educated  Patient    Methods  Explanation;Demonstration;Tactile cues;Verbal cues    Comprehension  Verbalized understanding;Returned demonstration       PT Short Term Goals - 11/23/18 1841      PT SHORT TERM GOAL #1   Title  Be independent with initial home exercise program for self-management of symptoms.    Baseline  initial HEP provided at IE (11/23/2018);     Time  2    Period  Weeks    Status  New    Target Date  12/07/18        PT Long Term Goals - 11/23/18 1842      PT LONG TERM GOAL #1   Title  Be independent with a long-term home exercise program for self-management of symptoms.     Baseline   Initial HEP provided at IE (11/23/2018);     Time  6    Period  Weeks    Status  New    Target Date  01/04/19      PT LONG TERM GOAL #2   Title  Demonstrate improved FOTO score by 10 units to demonstrate improvement in overall condition and self-reported functional ability.     Baseline  FOTO = 66 (11/23/2018);     Time  6    Period  Weeks    Status  New    Target Date  01/04/19      PT LONG TERM GOAL #3   Title  Complete community, work and/or recreational activities without limitation due to current condition.     Baseline  difficulty with prolonged standing, prolonged sitting, completing tasks in a slightly stooped position, moving into and out of flexion, picking something up off the floor without pushing off with hands. (11/23/2018);     Time  6    Period  Weeks    Status  New    Target Date  01/04/19      PT LONG TERM GOAL #4   Title  Patient will be able to perform 5TSTS test in equal or less than 10 seconds to demonstrate improvement in muscular strength and power for funcitonal activities.     Baseline  14 seconds (11/23/2018);     Time  6    Period  Weeks    Status  New    Target Date  01/04/19      PT LONG TERM GOAL #5   Title  Patient will be able to reach to toes and stand back up without experiencing her usual pain near 60 degrees of flexion to improve her ability to bend and pick things up off the floor.     Baseline  usual pain reported at 60 degrees flexion ascending and descending (11/23/2018);     Time  6    Period  Weeks    Status  New    Target Date  01/04/19         Plan - 11/23/18 1838    Clinical Impression Statement  Patient is a 84 y.o. female referred to outpatient physical therapy with a medical diagnosis of acute right-sided thoracic back pain who presents with signs and symptoms consistent with low back pain that radiates to the glutes, sarcopenia with decreased strength and muscular endurance, and mild difficulty with balance at risk for  pathological fracture due to medically diagnosed osteoporosis. Patient presents with significant pain, strength, muscular endurance, muscle bulk, bone strength, ROM, and postural impairments that are limiting ability to complete usual valued activities without difficulty including picking things up off the floor without requiring pushing off from the floor with hands, bending forward, prolonged standing and sitting, and lifting without difficulty. Patient will benefit from skilled physical therapy intervention to address current body structure impairments and activity limitations to improve function and work towards goals set in current POC in order to return to prior level of function or maximal functional improvement.     Personal Factors and Comorbidities  Age;Comorbidity 3+    Comorbidities  osteoporosis (fell and broke left patella, later had stress fracture on top of foot, 10 years ago or so), history of TIA (about 15-20 years ago, affected vision, no lingering effects), history of sarcoidosis (years ago, no problems), low body mass and muscle mass.    Examination-Activity Limitations  Stand;Bend;Squat;Carry;Lift;Other   picking things up from the ground   Examination-Participation Restrictions  Other;Shop;Meal Prep;Driving;Yard Work    Stability/Clinical Decision Making  Stable/Uncomplicated    Designer, jewellery  Low    Rehab Potential  Good    PT Frequency  2x / week    PT Duration  6 weeks    PT Treatment/Interventions  ADLs/Self Care Home Management;Aquatic Therapy;Cryotherapy;Moist Heat;Therapeutic activities;Therapeutic exercise;Balance training;Gait training;Neuromuscular re-education;Patient/family education;Manual techniques;Passive range of motion;Dry needling;Taping;Other (comment)   joint mobilizations grades I-IV   PT Next Visit Plan  assess response to HEP and progress strengthening and balance exercises    PT Home Exercise Plan  Medbridge Access Code: [email or web address]     Consulted  and Agree with Plan of Care  Patient        Patient will benefit from skilled therapeutic intervention in order to improve the following deficits and impairments:  Decreased mobility, Hypomobility, Decreased range of motion, Impaired perceived functional ability, Improper body mechanics, Decreased activity tolerance, Decreased strength, Pain, Postural dysfunction  Visit Diagnosis: Chronic right-sided low back pain, unspecified whether sciatica present  Muscle weakness (generalized)  Problem List Patient Active Problem List   Diagnosis Date Noted  . Vitamin D deficiency 10/14/2018  . Back pain 11/17/2017  . GERD (gastroesophageal reflux disease) 11/17/2017  . PAD (peripheral artery disease) ([place]) 11/15/2017  . Hearing loss in left ear 06/02/2016  . Squamous cell carcinoma, leg 05/30/2015  . IBS (irritable bowel syndrome) 07/27/2014  . Carotid atherosclerosis 06/29/2014  . Insomnia   . H. pylori infection 06/15/2012  . Osteoporosis 07/03/2011  . History of TIAs 06/02/2011  . Hypothyroidism 03/18/2010  . GLUTEN ENTEROPATHY 03/18/2010  . HYPERLIPIDEMIA TYPE IIB / III 02/13/2009  . Mitral valve prolapse 02/02/2009    Nancy Nordmann, PT, DPT 11/23/2018, 6:47 PM  [facility] [address], [place], [address] Phone: [phone]   Fax:  [phone]  Name: KAMREN HESKETT MRN: [id number] Date of Birth: 26-Dec-1934

## 2018-11-24 ENCOUNTER — Encounter: Payer: Medicare Other | Admitting: Physical Therapy

## 2018-11-25 ENCOUNTER — Ambulatory Visit: Payer: Medicare Other | Admitting: Physical Therapy

## 2018-11-26 ENCOUNTER — Ambulatory Visit: Payer: Medicare Other | Admitting: Physical Therapy

## 2018-11-29 ENCOUNTER — Encounter: Payer: Medicare Other | Admitting: Physical Therapy

## 2018-11-30 ENCOUNTER — Ambulatory Visit: Payer: Medicare Other | Admitting: Physical Therapy

## 2018-12-01 ENCOUNTER — Encounter: Payer: Medicare Other | Admitting: Physical Therapy

## 2018-12-02 ENCOUNTER — Ambulatory Visit: Payer: Medicare Other | Admitting: Physical Therapy

## 2018-12-06 ENCOUNTER — Inpatient Hospital Stay: Admission: RE | Admit: 2018-12-06 | Payer: Medicare Other | Source: Ambulatory Visit

## 2018-12-06 ENCOUNTER — Other Ambulatory Visit: Payer: Medicare Other

## 2018-12-07 ENCOUNTER — Encounter: Payer: Medicare Other | Admitting: Physical Therapy

## 2018-12-09 ENCOUNTER — Encounter: Payer: Medicare Other | Admitting: Physical Therapy

## 2018-12-14 ENCOUNTER — Encounter: Payer: Medicare Other | Admitting: Physical Therapy

## 2018-12-16 ENCOUNTER — Encounter: Payer: Medicare Other | Admitting: Physical Therapy

## 2018-12-21 ENCOUNTER — Encounter: Payer: Medicare Other | Admitting: Physical Therapy

## 2018-12-23 ENCOUNTER — Encounter: Payer: Medicare Other | Admitting: Physical Therapy

## 2018-12-27 ENCOUNTER — Encounter: Payer: Medicare Other | Admitting: Physical Therapy

## 2018-12-28 ENCOUNTER — Encounter: Payer: Medicare Other | Admitting: Physical Therapy

## 2018-12-30 ENCOUNTER — Encounter: Payer: Medicare Other | Admitting: Physical Therapy

## 2019-01-03 ENCOUNTER — Encounter: Payer: Medicare Other | Admitting: Physical Therapy

## 2019-01-04 ENCOUNTER — Encounter: Payer: Medicare Other | Admitting: Physical Therapy

## 2019-01-06 ENCOUNTER — Encounter: Payer: Medicare Other | Admitting: Physical Therapy

## 2019-02-22 DIAGNOSIS — M4716 Other spondylosis with myelopathy, lumbar region: Secondary | ICD-10-CM | POA: Diagnosis not present

## 2019-02-22 DIAGNOSIS — M5431 Sciatica, right side: Secondary | ICD-10-CM | POA: Diagnosis not present

## 2019-02-22 DIAGNOSIS — M9903 Segmental and somatic dysfunction of lumbar region: Secondary | ICD-10-CM | POA: Diagnosis not present

## 2019-02-22 DIAGNOSIS — M9902 Segmental and somatic dysfunction of thoracic region: Secondary | ICD-10-CM | POA: Diagnosis not present

## 2019-03-16 ENCOUNTER — Ambulatory Visit (INDEPENDENT_AMBULATORY_CARE_PROVIDER_SITE_OTHER): Payer: Medicare Other | Admitting: Family Medicine

## 2019-03-16 ENCOUNTER — Ambulatory Visit: Payer: Medicare Other | Admitting: Family Medicine

## 2019-03-16 ENCOUNTER — Other Ambulatory Visit: Payer: Self-pay | Admitting: Family Medicine

## 2019-03-16 ENCOUNTER — Encounter: Payer: Self-pay | Admitting: Family Medicine

## 2019-03-16 VITALS — BP 112/80 | HR 78 | Temp 98.3°F | Ht 63.0 in | Wt 90.0 lb

## 2019-03-16 DIAGNOSIS — K861 Other chronic pancreatitis: Secondary | ICD-10-CM

## 2019-03-16 DIAGNOSIS — K219 Gastro-esophageal reflux disease without esophagitis: Secondary | ICD-10-CM | POA: Diagnosis not present

## 2019-03-16 DIAGNOSIS — I6523 Occlusion and stenosis of bilateral carotid arteries: Secondary | ICD-10-CM

## 2019-03-16 DIAGNOSIS — R1011 Right upper quadrant pain: Secondary | ICD-10-CM | POA: Diagnosis not present

## 2019-03-16 DIAGNOSIS — E875 Hyperkalemia: Secondary | ICD-10-CM

## 2019-03-16 LAB — CBC WITH DIFFERENTIAL/PLATELET
Basophils Absolute: 0.1 10*3/uL (ref 0.0–0.1)
Basophils Relative: 0.9 % (ref 0.0–3.0)
Eosinophils Absolute: 0 10*3/uL (ref 0.0–0.7)
Eosinophils Relative: 0.8 % (ref 0.0–5.0)
HCT: 44.6 % (ref 36.0–46.0)
Hemoglobin: 14.9 g/dL (ref 12.0–15.0)
Lymphocytes Relative: 23.8 % (ref 12.0–46.0)
Lymphs Abs: 1.5 10*3/uL (ref 0.7–4.0)
MCHC: 33.5 g/dL (ref 30.0–36.0)
MCV: 98.1 fl (ref 78.0–100.0)
Monocytes Absolute: 0.5 10*3/uL (ref 0.1–1.0)
Monocytes Relative: 8 % (ref 3.0–12.0)
Neutro Abs: 4.1 10*3/uL (ref 1.4–7.7)
Neutrophils Relative %: 66.5 % (ref 43.0–77.0)
Platelets: 242 10*3/uL (ref 150.0–400.0)
RBC: 4.55 Mil/uL (ref 3.87–5.11)
RDW: 13.1 % (ref 11.5–15.5)
WBC: 6.2 10*3/uL (ref 4.0–10.5)

## 2019-03-16 LAB — LIPASE: Lipase: 60 U/L — ABNORMAL HIGH (ref 11.0–59.0)

## 2019-03-16 LAB — COMPREHENSIVE METABOLIC PANEL
ALT: 17 U/L (ref 0–35)
AST: 24 U/L (ref 0–37)
Albumin: 4.5 g/dL (ref 3.5–5.2)
Alkaline Phosphatase: 48 U/L (ref 39–117)
BUN: 15 mg/dL (ref 6–23)
CO2: 30 mEq/L (ref 19–32)
Calcium: 9.8 mg/dL (ref 8.4–10.5)
Chloride: 100 mEq/L (ref 96–112)
Creatinine, Ser: 0.78 mg/dL (ref 0.40–1.20)
GFR: 70.63 mL/min (ref >60.00)
Glucose, Bld: 118 mg/dL — ABNORMAL HIGH (ref 70–99)
Potassium: 5.6 mEq/L — ABNORMAL HIGH (ref 3.5–5.1)
Sodium: 138 mEq/L (ref 135–145)
Total Bilirubin: 0.8 mg/dL (ref 0.2–1.2)
Total Protein: 6.9 g/dL (ref 6.0–8.3)

## 2019-03-16 MED ORDER — SODIUM POLYSTYRENE SULFONATE PO POWD: ORAL | 0 refills | Status: DC

## 2019-03-16 MED ORDER — SODIUM POLYSTYRENE SULFONATE 15 GM/60ML PO SUSP: 15.0000 g | Freq: Once | ORAL | 0 refills | Status: DC

## 2019-03-16 MED ORDER — SODIUM POLYSTYRENE SULFONATE 15 GM/60ML PO SUSP: 15.0000 g | Freq: Once | ORAL | 0 refills | Status: AC

## 2019-03-16 MED ORDER — ZOSTER VAC RECOMB ADJUVANTED 50 MCG/0.5ML IM SUSR: 0.5000 mL | Freq: Once | INTRAMUSCULAR | 1 refills | Status: AC

## 2019-03-16 NOTE — Progress Notes (Signed)
Subjective:   Patient ID: Deanna Cobb, female    DOB: [birth year], 84 y.o.   MRN: [id number]  Deanna Cobb is a pleasant 84 y.o. year old female who presents to clinic today with Pain (abdominal pain pt states pain could be on left or right side top or bottom, indegestion at night/ keeps her awake and she states she's not eating well/ 7 days)  on 11/09/74  HPI:  Complicated GI history-  H/o IBS and remote h/o C diff. Also remote h/o H pylori infection in 2014.  Abdominal pain with indigestion- feels acid coming up in her throat.  Pain moves around- mainly RUQ.    Having more indegestion at night, keeping her awake.  Has never had indigestion that wakes her up from sleep- burping, keeps her up for about for about an hour.  Decreased appetite for 7 days.   Wt Readings from Last 3 Encounters:  03/16/19 90 lb (40.8 kg)  10/14/18 96 lb 12.8 oz (43.9 kg)  10/06/18 93 lb 12.8 oz (42.5 kg)   No nausea, vomiting, diarrhea, no blood in stool.   Prilosec has not helped.  Not sure if certain foods make it worse but she is very careful with her diet.  Last upper GI done by Delfin Edis in 2012- reviewed.  Followed by Dr. Watt Climes now.  She does have h/o diverticulosis. Colonoscopy 12/2015.   Current Outpatient Medications on File Prior to Visit  Medication Sig Dispense Refill  . ARMOUR THYROID 15 MG tablet TAKE 1 TABLET (15 MG TOTAL) BY MOUTH DAILY. 90 tablet 3  . cholecalciferol (VITAMIN D) 1000 UNITS tablet Take 1,000 Units by mouth daily as needed.     [name] (INTRINSI B12-FOLATE) 226-333-54 MCG-MCG-MG TABS Take 1 tablet by mouth daily.     Marland Kitchen MAGNESIUM PO Take 450 mg by mouth.    . Multiple Vitamins-Minerals (ICAPS AREDS 2 PO) Take by mouth.    . Multiple Vitamins-Minerals (MULTIVITAMIN ADULTS PO) Take by mouth. Emtero Vite    . Omega-3 Fatty Acids (EPA) 1000 MG CAPS Take 1 capsule by mouth 3 (three) times daily.     . Probiotic Product (PROBIOTIC ADVANCED PO)  Take by mouth.    . TURMERIC PO Take by mouth daily.    . Coenzyme Q10 (COQ-10) 100 MG CAPS Take one by mouth daily     Current Facility-Administered Medications on File Prior to Visit  Medication Dose Route Frequency Provider Last Rate Last Dose  . denosumab (PROLIA) injection 60 mg  60 mg Subcutaneous Q6 months Lucille Passy, MD   60 mg at 08/27/16 1640    Allergies  Allergen Reactions  . Clindamycin/Lincomycin   . Penicillins     REACTION: hives    Past Medical History:  Diagnosis Date  . Accidental fall from chair   . Allergy    multiple food allergies  . Asthma    as a child  . Colitis 2012  . Coronary atherosclerosis    D/T lipid rich plaque  . GERD (gastroesophageal reflux disease)   . H. pylori infection 2012  . Heart murmur   . Hyperlipidemia   . Hypothyroidism   . Insomnia   . Internal hemorrhoids   . Mitral valve prolapse   . Osteoporosis   . Sarcoidosis 1966  . TIA (transient ischemic attack) 04/2005  . Visit for suture removal     Past Surgical History:  Procedure Laterality Date  . ABDOMINAL HYSTERECTOMY  1972  .  LYMPH NODE BIOPSY  1966  . TONSILLECTOMY      Family History  Problem Relation Age of Onset  . Throat cancer Mother   . Alcohol abuse Brother   . Heart disease Sister     Social History   Socioeconomic History  . Marital status: Married    Spouse name: Not on file  . Number of children: 4  . Years of education: Not on file  . Highest education level: Not on file  Occupational History  . Occupation: Retired-RN  Scientific laboratory technician  . Financial resource strain: Not on file  . Food insecurity    Worry: Not on file    Inability: Not on file  . Transportation needs    Medical: Not on file    Non-medical: Not on file  Tobacco Use  . Smoking status: Never Smoker  . Smokeless tobacco: Never Used  Substance and Sexual Activity  . Alcohol use: Yes    Comment: 7 glasses of wine a week  . Drug use: No  . Sexual activity: Never   Lifestyle  . Physical activity    Days per week: Not on file    Minutes per session: Not on file  . Stress: Not on file  Relationships  . Social Herbalist on phone: Not on file    Gets together: Not on file    Attends religious service: Not on file    Active member of club or organization: Not on file    Attends meetings of clubs or organizations: Not on file    Relationship status: Not on file  . Intimate partner violence    Fear of current or ex partner: Not on file    Emotionally abused: Not on file    Physically abused: Not on file    Forced sexual activity: Not on file  Other Topics Concern  . Not on file  Social History Narrative   Does have a living will.   Would desire CPR but would not want prolonged life support.   The PMH, PSH, Social History, Family History, Medications, and allergies have been reviewed in [facility], and have been updated if relevant.   Review of Systems  Constitutional: Positive for appetite change and unexpected weight change. Negative for chills, diaphoresis, fatigue and fever.  HENT: Negative.   Gastrointestinal: Positive for abdominal distention and abdominal pain. Negative for anal bleeding, blood in stool, constipation, diarrhea, nausea, rectal pain and vomiting.       + burping  Genitourinary: Negative.   Musculoskeletal: Negative.   Neurological: Negative.   Hematological: Negative.   Psychiatric/Behavioral: Negative.   All other systems reviewed and are negative.      Objective:    BP 112/80   Pulse 78   Temp 98.3 F (36.8 C) (Oral)   Ht 5\' 3"  (1.6 m)   Wt 90 lb (40.8 kg)   SpO2 99%   BMI 15.94 kg/m   Wt Readings from Last 3 Encounters:  03/16/19 90 lb (40.8 kg)  10/14/18 96 lb 12.8 oz (43.9 kg)  10/06/18 93 lb 12.8 oz (42.5 kg)     Physical Exam Vitals signs and nursing note reviewed.  Constitutional:      Appearance: Normal appearance. She is not ill-appearing, toxic-appearing or diaphoretic.  HENT:      Head: Normocephalic and atraumatic.     Right Ear: External ear normal.     Left Ear: External ear normal.     Nose: Nose normal.  Mouth/Throat:     Mouth: Mucous membranes are moist.  Eyes:     Extraocular Movements: Extraocular movements intact.  Neck:     Musculoskeletal: Normal range of motion.  Cardiovascular:     Rate and Rhythm: Normal rate.     Pulses: Normal pulses.  Pulmonary:     Effort: Pulmonary effort is normal.  Abdominal:     General: Abdomen is flat. There is no distension.     Palpations: There is no mass.     Tenderness: There is abdominal tenderness in the right upper quadrant. There is no right CVA tenderness, left CVA tenderness, guarding or rebound.     Hernia: No hernia is present.  Musculoskeletal: Normal range of motion.        General: No swelling.     Right lower leg: No edema.     Left lower leg: No edema.  Skin:    General: Skin is warm and dry.  Neurological:     General: No focal deficit present.     Mental Status: She is alert and oriented to person, place, and time. Mental status is at baseline.     Cranial Nerves: No cranial nerve deficit.  Psychiatric:        Mood and Affect: Mood normal.        Behavior: Behavior normal.        Thought Content: Thought content normal.        Judgment: Judgment normal.

## 2019-03-16 NOTE — Addendum Note (Signed)
Addended by: Lucille Passy on: 03/16/2019 11:46 AM   Modules accepted: Orders

## 2019-03-16 NOTE — Assessment & Plan Note (Signed)
New- >25 minutes spent in face to face time with patient, >50% spent in counselling or coordination of care discussing GERD, RUQ pain and decreased appetite with weight loss.  Very complicated GI history.   ?GERD vs biliary colic. RUQ [place] ordered, along with lipase. H pylori stool antigen (as she has had this in past). Discussed GERD friendly diet- see AVS. Orders Placed This Encounter  Procedures  . Helicobacter pylori special antigen  . US Abdomen Limited RUQ  . CBC with Differential/Platelet  . Comprehensive metabolic panel  . Lipase   The patient indicates understanding of these issues and agrees with the plan.

## 2019-03-16 NOTE — Patient Instructions (Addendum)
Great to see you.  Some one will call you with your Ultrasound appointment.  I will call you with your lab results from today and you can view them online.   Please schedule your bone density test.   Food Choices for Gastroesophageal Reflux Disease, Adult When you have gastroesophageal reflux disease (GERD), the foods you eat and your eating habits are very important. Choosing the right foods can help ease your discomfort. Think about working with a nutrition specialist (dietitian) to help you make good choices. What are tips for following this plan?  Meals  Choose healthy foods that are low in fat, such as fruits, vegetables, whole grains, low-fat dairy products, and lean meat, fish, and poultry.  Eat small meals often instead of 3 large meals a day. Eat your meals slowly, and in a place where you are relaxed. Avoid bending over or lying down until 2-3 hours after eating.  Avoid eating meals 2-3 hours before bed.  Avoid drinking a lot of liquid with meals.  Cook foods using methods other than frying. Bake, grill, or broil food instead.  Avoid or limit: ? Chocolate. ? Peppermint or spearmint. ? Alcohol. ? Pepper. ? Black and decaffeinated coffee. ? Black and decaffeinated tea. ? Bubbly (carbonated) soft drinks. ? Caffeinated energy drinks and soft drinks.  Limit high-fat foods such as: ? Fatty meat or fried foods. ? Whole milk, cream, butter, or ice cream. ? Nuts and nut butters. ? Pastries, donuts, and sweets made with butter or shortening.  Avoid foods that cause symptoms. These foods may be different for everyone. Common foods that cause symptoms include: ? Tomatoes. ? Oranges, lemons, and limes. ? Peppers. ? Spicy food. ? Onions and garlic. ? Vinegar. Lifestyle  Maintain a healthy weight. Ask your doctor what weight is healthy for you. If you need to lose weight, work with your doctor to do so safely.  Exercise for at least 30 minutes for 5 or more days each  week, or as told by your doctor.  Wear loose-fitting clothes.  Do not smoke. If you need help quitting, ask your doctor.  Sleep with the head of your bed higher than your feet. Use a wedge under the mattress or blocks under the bed frame to raise the head of the bed. Summary  When you have gastroesophageal reflux disease (GERD), food and lifestyle choices are very important in easing your symptoms.  Eat small meals often instead of 3 large meals a day. Eat your meals slowly, and in a place where you are relaxed.  Limit high-fat foods such as fatty meat or fried foods.  Avoid bending over or lying down until 2-3 hours after eating.  Avoid peppermint and spearmint, caffeine, alcohol, and chocolate. This information is not intended to replace advice given to you by your health care provider. Make sure you discuss any questions you have with your health care provider. Document Released: 02/24/2012 Document Revised: 12/16/2018 Document Reviewed: 09/30/2016 Elsevier Patient Education  2020 [employer].

## 2019-03-17 ENCOUNTER — Ambulatory Visit: Payer: Medicare Other

## 2019-03-18 ENCOUNTER — Other Ambulatory Visit: Payer: Self-pay

## 2019-03-18 ENCOUNTER — Other Ambulatory Visit (INDEPENDENT_AMBULATORY_CARE_PROVIDER_SITE_OTHER): Payer: Medicare Other

## 2019-03-18 ENCOUNTER — Ambulatory Visit
Admission: RE | Admit: 2019-03-18 | Discharge: 2019-03-18 | Disposition: A | Discharge status: Home / Self Care | Payer: Medicare Other | Source: Ambulatory Visit | Attending: Family Medicine | Admitting: Family Medicine

## 2019-03-18 DIAGNOSIS — E875 Hyperkalemia: Secondary | ICD-10-CM

## 2019-03-18 DIAGNOSIS — K861 Other chronic pancreatitis: Secondary | ICD-10-CM | POA: Diagnosis not present

## 2019-03-18 DIAGNOSIS — K219 Gastro-esophageal reflux disease without esophagitis: Secondary | ICD-10-CM | POA: Diagnosis not present

## 2019-03-18 DIAGNOSIS — R1011 Right upper quadrant pain: Secondary | ICD-10-CM | POA: Diagnosis not present

## 2019-03-18 LAB — COMPREHENSIVE METABOLIC PANEL
ALT: 15 U/L (ref 0–35)
AST: 19 U/L (ref 0–37)
Albumin: 4.5 g/dL (ref 3.5–5.2)
Alkaline Phosphatase: 42 U/L (ref 39–117)
BUN: 13 mg/dL (ref 6–23)
CO2: 30 mEq/L (ref 19–32)
Calcium: 9.5 mg/dL (ref 8.4–10.5)
Chloride: 102 mEq/L (ref 96–112)
Creatinine, Ser: 0.73 mg/dL (ref 0.40–1.20)
GFR: 76.24 mL/min (ref >60.00)
Glucose, Bld: 91 mg/dL (ref 70–99)
Potassium: 4.1 mEq/L (ref 3.5–5.1)
Sodium: 142 mEq/L (ref 135–145)
Total Bilirubin: 1 mg/dL (ref 0.2–1.2)
Total Protein: 6.7 g/dL (ref 6.0–8.3)

## 2019-03-18 LAB — LIPASE: Lipase: 45 U/L (ref 11.0–59.0)

## 2019-03-21 LAB — HELICOBACTER PYLORI  SPECIAL ANTIGEN
MICRO NUMBER:: 654860
SPECIMEN QUALITY: ADEQUATE

## 2019-03-22 ENCOUNTER — Encounter: Payer: Self-pay | Admitting: Family Medicine

## 2019-03-22 ENCOUNTER — Other Ambulatory Visit: Payer: Self-pay | Admitting: Family Medicine

## 2019-03-22 DIAGNOSIS — K851 Biliary acute pancreatitis without necrosis or infection: Secondary | ICD-10-CM

## 2019-03-22 DIAGNOSIS — R1011 Right upper quadrant pain: Secondary | ICD-10-CM

## 2019-03-24 ENCOUNTER — Encounter: Payer: Self-pay | Admitting: Family Medicine

## 2019-03-25 ENCOUNTER — Other Ambulatory Visit: Payer: Self-pay

## 2019-04-06 DIAGNOSIS — R1011 Right upper quadrant pain: Secondary | ICD-10-CM | POA: Diagnosis not present

## 2019-04-06 DIAGNOSIS — K219 Gastro-esophageal reflux disease without esophagitis: Secondary | ICD-10-CM | POA: Diagnosis not present

## 2019-04-06 DIAGNOSIS — M1990 Unspecified osteoarthritis, unspecified site: Secondary | ICD-10-CM | POA: Insufficient documentation

## 2019-04-06 DIAGNOSIS — K582 Mixed irritable bowel syndrome: Secondary | ICD-10-CM | POA: Diagnosis not present

## 2019-04-11 ENCOUNTER — Other Ambulatory Visit: Payer: Self-pay

## 2019-04-25 ENCOUNTER — Other Ambulatory Visit: Payer: Self-pay

## 2019-04-25 DIAGNOSIS — H90A22 Sensorineural hearing loss, unilateral, left ear, with restricted hearing on the contralateral side: Secondary | ICD-10-CM | POA: Diagnosis not present

## 2019-04-25 DIAGNOSIS — I6350 Cerebral infarction due to unspecified occlusion or stenosis of unspecified cerebral artery: Secondary | ICD-10-CM | POA: Diagnosis not present

## 2019-04-25 DIAGNOSIS — H903 Sensorineural hearing loss, bilateral: Secondary | ICD-10-CM | POA: Diagnosis not present

## 2019-04-25 DIAGNOSIS — I519 Heart disease, unspecified: Secondary | ICD-10-CM | POA: Diagnosis not present

## 2019-04-25 DIAGNOSIS — H6122 Impacted cerumen, left ear: Secondary | ICD-10-CM | POA: Diagnosis not present

## 2019-04-25 DIAGNOSIS — Z20822 Contact with and (suspected) exposure to covid-19: Secondary | ICD-10-CM

## 2019-04-25 DIAGNOSIS — R42 Dizziness and giddiness: Secondary | ICD-10-CM | POA: Diagnosis not present

## 2019-04-25 DIAGNOSIS — E079 Disorder of thyroid, unspecified: Secondary | ICD-10-CM | POA: Diagnosis not present

## 2019-04-25 DIAGNOSIS — R6889 Other general symptoms and signs: Secondary | ICD-10-CM | POA: Diagnosis not present

## 2019-04-26 ENCOUNTER — Ambulatory Visit: Payer: Self-pay | Admitting: *Deleted

## 2019-04-26 NOTE — Telephone Encounter (Signed)
Noted! Thank you

## 2019-04-26 NOTE — Telephone Encounter (Signed)
TA-Plz see below/she is fearful that her dizziness and nausea could be due to hypokalemia/I see where she is suspected to have COVID and a going to screen/plz advise/thx dmf

## 2019-04-26 NOTE — Telephone Encounter (Signed)
TA-This RN from the [facility] should never have advised the pt to call the office for an appointment/she has order in system for COVID/I LMOVM for pt advising that I have changed it to a doxy.me visit as we cannot have anyone with COVID Sx in the office/asked that she call the office to acknowledge receipt of message/Monica and Lavella Lemons are in agreement and are aware/thx dmf

## 2019-04-26 NOTE — Telephone Encounter (Signed)
Patient has been having dizziness since Saturday- patient went to ENT and had ears checked. Patient states she is still having dizziness and nausea. She is afraid that her K+ may be low again.  Call to office for appointment.  Reason for Disposition . [1] MILD dizziness (e.g., walking normally) AND [2] has NOT been evaluated by physician for this  (Exception: dizziness caused by heat exposure, sudden standing, or poor fluid intake)  Answer Assessment - Initial Assessment Questions 1. DESCRIPTION: "Describe your dizziness."     Sudden movement- going from pose in yoga 2. LIGHTHEADED: "Do you feel lightheaded?" (e.g., somewhat faint, woozy, weak upon standing)     Not intense- some nausea slight 3. VERTIGO: "Do you feel like either you or the room is spinning or tilting?" (i.e. vertigo)     no 4. SEVERITY: "How bad is it?"  "Do you feel like you are going to faint?" "Can you stand and walk?"   - MILD - walking normally   - MODERATE - interferes with normal activities (e.g., work, school)    - SEVERE - unable to stand, requires support to walk, feels like passing out now.      mild 5. ONSET:  "When did the dizziness begin?"     Saturday- on/off 6. AGGRAVATING FACTORS: "Does anything make it worse?" (e.g., standing, change in head position)     Movement, head position 7. HEART RATE: "Can you tell me your heart rate?" "How many beats in 15 seconds?"  (Note: not all patients can do this)       Felt like it was increased with yoga 8. CAUSE: "What do you think is causing the dizziness?"     Unknown- possible potasium imbalance 9. RECURRENT SYMPTOM: "Have you had dizziness before?" If so, ask: "When was the last time?" "What happened that time?"     Yes- with ear blockage- has had that checked 10. OTHER SYMPTOMS: "Do you have any other symptoms?" (e.g., fever, chest pain, vomiting, diarrhea, bleeding)       Nausea, felt like she was getting sick Saturday , 98.8 temp 11. PREGNANCY: "Is there any  chance you are pregnant?" "When was your last menstrual period?"       n/a  Protocols used: DIZZINESS [facility]

## 2019-04-27 ENCOUNTER — Encounter: Payer: Self-pay | Admitting: Family Medicine

## 2019-04-27 ENCOUNTER — Ambulatory Visit (INDEPENDENT_AMBULATORY_CARE_PROVIDER_SITE_OTHER): Payer: Medicare Other | Admitting: Family Medicine

## 2019-04-27 VITALS — BP 114/80 | HR 80 | Temp 98.1°F | Ht 63.0 in | Wt 89.8 lb

## 2019-04-27 DIAGNOSIS — I6523 Occlusion and stenosis of bilateral carotid arteries: Secondary | ICD-10-CM

## 2019-04-27 DIAGNOSIS — H6121 Impacted cerumen, right ear: Secondary | ICD-10-CM | POA: Diagnosis not present

## 2019-04-27 DIAGNOSIS — R42 Dizziness and giddiness: Secondary | ICD-10-CM | POA: Insufficient documentation

## 2019-04-27 DIAGNOSIS — Z8639 Personal history of other endocrine, nutritional and metabolic disease: Secondary | ICD-10-CM | POA: Diagnosis not present

## 2019-04-27 DIAGNOSIS — E039 Hypothyroidism, unspecified: Secondary | ICD-10-CM

## 2019-04-27 DIAGNOSIS — R64 Cachexia: Secondary | ICD-10-CM

## 2019-04-27 LAB — COMPREHENSIVE METABOLIC PANEL
ALT: 14 U/L (ref 0–35)
AST: 17 U/L (ref 0–37)
Albumin: 4.5 g/dL (ref 3.5–5.2)
Alkaline Phosphatase: 41 U/L (ref 39–117)
BUN: 11 mg/dL (ref 6–23)
CO2: 29 mEq/L (ref 19–32)
Calcium: 9.4 mg/dL (ref 8.4–10.5)
Chloride: 102 mEq/L (ref 96–112)
Creatinine, Ser: 0.61 mg/dL (ref 0.40–1.20)
GFR: 93.77 mL/min (ref >60.00)
Glucose, Bld: 116 mg/dL — ABNORMAL HIGH (ref 70–99)
Potassium: 4.1 mEq/L (ref 3.5–5.1)
Sodium: 139 mEq/L (ref 135–145)
Total Bilirubin: 0.8 mg/dL (ref 0.2–1.2)
Total Protein: 6.8 g/dL (ref 6.0–8.3)

## 2019-04-27 LAB — CBC WITH DIFFERENTIAL/PLATELET
Basophils Absolute: 0 10*3/uL (ref 0.0–0.1)
Basophils Relative: 0.7 % (ref 0.0–3.0)
Eosinophils Absolute: 0 10*3/uL (ref 0.0–0.7)
Eosinophils Relative: 0.4 % (ref 0.0–5.0)
HCT: 41.7 % (ref 36.0–46.0)
Hemoglobin: 13.9 g/dL (ref 12.0–15.0)
Lymphocytes Relative: 20.3 % (ref 12.0–46.0)
Lymphs Abs: 1.2 10*3/uL (ref 0.7–4.0)
MCHC: 33.2 g/dL (ref 30.0–36.0)
MCV: 98.4 fl (ref 78.0–100.0)
Monocytes Absolute: 0.4 10*3/uL (ref 0.1–1.0)
Monocytes Relative: 7.2 % (ref 3.0–12.0)
Neutro Abs: 4.2 10*3/uL (ref 1.4–7.7)
Neutrophils Relative %: 71.4 % (ref 43.0–77.0)
Platelets: 228 10*3/uL (ref 150.0–400.0)
RBC: 4.24 Mil/uL (ref 3.87–5.11)
RDW: 13.1 % (ref 11.5–15.5)
WBC: 5.8 10*3/uL (ref 4.0–10.5)

## 2019-04-27 LAB — NOVEL CORONAVIRUS, NAA: SARS-CoV-2, NAA: NOT DETECTED

## 2019-04-27 LAB — TSH: TSH: 2.11 u[IU]/mL (ref 0.35–4.50)

## 2019-04-27 LAB — FERRITIN: Ferritin: 61.7 ng/mL (ref 10.0–291.0)

## 2019-04-27 LAB — T4, FREE: Free T4: 0.94 ng/dL (ref 0.60–1.60)

## 2019-04-27 NOTE — Patient Instructions (Addendum)
Great to see you! I will call you with your lab results from today and you can view them online.   I want you drinking ensure once preferably twice daily.   Benign Positional Vertigo Vertigo is the feeling that you or your surroundings are moving when they are not. Benign positional vertigo is the most common form of vertigo. This is usually a harmless condition (benign). This condition is positional. This means that symptoms are triggered by certain movements and positions. This condition can be dangerous if it occurs while you are doing something that could cause harm to you or others. This includes activities such as driving or operating machinery. What are the causes? In many cases, the cause of this condition is not known. It may be caused by a disturbance in an area of the inner ear that helps your brain to sense movement and balance. This disturbance can be caused by:  Viral infection (labyrinthitis).  Head injury.  Repetitive motion, such as jumping, dancing, or running. What increases the risk? You are more likely to develop this condition if:  You are a woman.  You are 18 years of age or older. What are the signs or symptoms? Symptoms of this condition usually happen when you move your head or your eyes in different directions. Symptoms may start suddenly, and usually last for less than a minute. They include:  Loss of balance and falling.  Feeling like you are spinning or moving.  Feeling like your surroundings are spinning or moving.  Nausea and vomiting.  Blurred vision.  Dizziness.  Involuntary eye movement (nystagmus). Symptoms can be mild and cause only minor problems, or they can be severe and interfere with daily life. Episodes of benign positional vertigo may return (recur) over time. Symptoms may improve over time. How is this diagnosed? This condition may be diagnosed based on:  Your medical history.  Physical exam of the head, neck, and ears.   Tests, such as: ? MRI. ? CT scan. ? Eye movement tests. Your health care provider may ask you to change positions quickly while he or she watches you for symptoms of benign positional vertigo, such as nystagmus. Eye movement may be tested with a variety of exams that are designed to evaluate or stimulate vertigo. ? An electroencephalogram (EEG). This records electrical activity in your brain. ? Hearing tests. You may be referred to a health care provider who specializes in ear, nose, and throat (ENT) problems (otolaryngologist) or a provider who specializes in disorders of the nervous system (neurologist). How is this treated?  This condition may be treated in a session in which your health care provider moves your head in specific positions to adjust your inner ear back to normal. Treatment for this condition may take several sessions. Surgery may be needed in severe cases, but this is rare. In some cases, benign positional vertigo may resolve on its own in 2-4 weeks. Follow these instructions at home: Safety  Move slowly. Avoid sudden body or head movements or certain positions, as told by your health care provider.  Avoid driving until your health care provider says it is safe for you to do so.  Avoid operating heavy machinery until your health care provider says it is safe for you to do so.  Avoid doing any tasks that would be dangerous to you or others if vertigo occurs.  If you have trouble walking or keeping your balance, try using a cane for stability. If you feel dizzy or unstable,  sit down right away.  Return to your normal activities as told by your health care provider. Ask your health care provider what activities are safe for you. General instructions  Take over-the-counter and prescription medicines only as told by your health care provider.  Drink enough fluid to keep your urine pale yellow.  Keep all follow-up visits as told by your health care provider. This is  important. Contact a health care provider if:  You have a fever.  Your condition gets worse or you develop new symptoms.  Your family or friends notice any behavioral changes.  You have nausea or vomiting that gets worse.  You have numbness or a "pins and needles" sensation. Get help right away if you:  Have difficulty speaking or moving.  Are always dizzy.  Faint.  Develop severe headaches.  Have weakness in your legs or arms.  Have changes in your hearing or vision.  Develop a stiff neck.  Develop sensitivity to light. Summary  Vertigo is the feeling that you or your surroundings are moving when they are not. Benign positional vertigo is the most common form of vertigo.  The cause of this condition is not known. It may be caused by a disturbance in an area of the inner ear that helps your brain to sense movement and balance.  Symptoms include loss of balance and falling, feeling that you or your surroundings are moving, nausea and vomiting, and blurred vision.  How to Perform the Epley Maneuver The Epley maneuver is an exercise that relieves symptoms of vertigo. Vertigo is the feeling that you or your surroundings are moving when they are not. When you feel vertigo, you may feel like the room is spinning and have trouble walking. Dizziness is a little different than vertigo. When you are dizzy, you may feel unsteady or light-headed. You can do this maneuver at home whenever you have symptoms of vertigo. You can do it up to 3 times a day until your symptoms go away. Even though the Epley maneuver may relieve your vertigo for a few weeks, it is possible that your symptoms will return. This maneuver relieves vertigo, but it does not relieve dizziness. What are the risks? If it is done correctly, the Epley maneuver is considered safe. Sometimes it can lead to dizziness or nausea that goes away after a short time. If you develop other symptoms, such as changes in vision,  weakness, or numbness, stop doing the maneuver and call your health care provider. How to perform the Epley maneuver 1. Sit on the edge of a bed or table with your back straight and your legs extended or hanging over the edge of the bed or table. 2. Turn your head halfway toward the affected ear or side. 3. Lie backward quickly with your head turned until you are lying flat on your back. You may want to position a pillow under your shoulders. 4. Hold this position for 30 seconds. You may experience an attack of vertigo. This is normal. 5. Turn your head to the opposite direction until your unaffected ear is facing the floor. 6. Hold this position for 30 seconds. You may experience an attack of vertigo. This is normal. Hold this position until the vertigo stops. 7. Turn your whole body to the same side as your head. Hold for another 30 seconds. 8. Sit back up. You can repeat this exercise up to 3 times a day. Follow these instructions at home:  After doing the Epley maneuver, you can  return to your normal activities.  Ask your health care provider if there is anything you should do at home to prevent vertigo. He or she may recommend that you: ? Keep your head raised (elevated) with two or more pillows while you sleep. ? Do not sleep on the side of your affected ear. ? Get up slowly from bed. ? Avoid sudden movements during the day. ? Avoid extreme head movement, like looking up or bending over. Contact a health care provider if:  Your vertigo gets worse.  You have other symptoms, including: ? Nausea. ? Vomiting. ? Headache. Get help right away if:  You have vision changes.  You have a severe or worsening headache or neck pain.  You cannot stop vomiting.  You have new numbness or weakness in any part of your body. Summary  Vertigo is the feeling that you or your surroundings are moving when they are not.  The Epley maneuver is an exercise that relieves symptoms of vertigo.   If the Epley maneuver is done correctly, it is considered safe. You can do it up to 3 times

## 2019-04-27 NOTE — Assessment & Plan Note (Signed)
Ceruminosis is noted.  Wax is removed by syringing and manual debridement. Instructions for home care to prevent wax buildup are given.  

## 2019-04-27 NOTE — Assessment & Plan Note (Signed)
>  40 minutes spent in face to face time with patient, >50% spent in counselling or coordination of care discussing dizziness. We discussed potential causes but history and physical consistent with BPV.  Given home eply maneuvers to do.  Removed cerumen which was likely the causative issue of her BPV.  Her weight is still low so I have suggested she start drinking ensure. Labs today as well.  Orders Placed This Encounter  Procedures  . TSH  . T4, free  . CBC with Differential/Platelet  . Comprehensive metabolic panel  . Ferritin  . T3  . EAR CERUMEN REMOVAL

## 2019-04-27 NOTE — Progress Notes (Signed)
Subjective:   Patient ID: Deanna Cobb, female    DOB: 07/02/1935, 84 y.o.   MRN: [id number]  Deanna Cobb is a pleasant 84 y.o. year old female who presents to clinic today with Dizziness (Pt screened at vehicle. She is here today C/O dizziness and is wondering if she has Hyperkalemia again as she feels the same as she did last time. Dizziness started Saturday with the turn of her head or make a sudden turn. )  on 04/27/2019  HPI:   Dizziness- Pt called RN triage on 04/26/19 stating the following:  "Patient has been having dizziness since Saturday- patient went to ENT and had ears checked. Patient states she is still having dizziness and nausea. She is afraid that her K+ may be low again."  This is how she felt last time her potassium was high.  Dizziness started Saturday with the turn of her head or make a sudden turn.  Nausea has resolved.  Episodes last for seconds.  She did have COVID testing done on 04/25/19- results negative.  Recent history of hyperkalemia- given one dose of kayexlate and potassium normalized. Lab Results  Component Value Date   NA 142 03/18/2019   K 4.1 03/18/2019   CL 102 03/18/2019   CO2 30 03/18/2019   Hypothyroidism- on armour 15 mg daily. Lab Results  Component Value Date   TSH 1.66 10/06/2018     Current Outpatient Medications on File Prior to Visit  Medication Sig Dispense Refill  . ARMOUR THYROID 15 MG tablet TAKE 1 TABLET (15 MG TOTAL) BY MOUTH DAILY. 90 tablet 3  . cholecalciferol (VITAMIN D) 1000 UNITS tablet Take 1,000 Units by mouth daily as needed.     [name] (INTRINSI B12-FOLATE) 032-122-48 MCG-MCG-MG TABS Take 1 tablet by mouth daily.     . Multiple Vitamins-Minerals (ICAPS AREDS 2 PO) Take by mouth.    . Multiple Vitamins-Minerals (MULTIVITAMIN ADULTS PO) Take by mouth. Emtero Vite    . Omega-3 Fatty Acids (EPA) 1000 MG CAPS Take 1 capsule by mouth 3 (three) times daily.     . Probiotic Product (PROBIOTIC  ADVANCED PO) Take by mouth.    . TURMERIC PO Take by mouth daily.    . Coenzyme Q10 (COQ-10) 100 MG CAPS Take one by mouth daily     Current Facility-Administered Medications on File Prior to Visit  Medication Dose Route Frequency Provider Last Rate Last Dose  . denosumab (PROLIA) injection 60 mg  60 mg Subcutaneous Q6 months Lucille Passy, MD   60 mg at 08/27/16 1640    Allergies  Allergen Reactions  . Clindamycin/Lincomycin   . Penicillins     REACTION: hives    Past Medical History:  Diagnosis Date  . Accidental fall from chair   . Allergy    multiple food allergies  . Asthma    as a child  . Colitis 2012  . Coronary atherosclerosis    D/T lipid rich plaque  . GERD (gastroesophageal reflux disease)   . H. pylori infection 2012  . Heart murmur   . Hyperlipidemia   . Hypothyroidism   . Insomnia   . Internal hemorrhoids   . Mitral valve prolapse   . Osteoporosis   . Sarcoidosis 1966  . TIA (transient ischemic attack) 04/2005  . Visit for suture removal     Past Surgical History:  Procedure Laterality Date  . ABDOMINAL HYSTERECTOMY  1972  . LYMPH NODE BIOPSY  1966  .  TONSILLECTOMY      Family History  Problem Relation Age of Onset  . Throat cancer Mother   . Alcohol abuse Brother   . Heart disease Sister     Social History   Socioeconomic History  . Marital status: Married    Spouse name: Not on file  . Number of children: 4  . Years of education: Not on file  . Highest education level: Not on file  Occupational History  . Occupation: Retired-RN  Scientific laboratory technician  . Financial resource strain: Not on file  . Food insecurity    Worry: Not on file    Inability: Not on file  . Transportation needs    Medical: Not on file    Non-medical: Not on file  Tobacco Use  . Smoking status: Never Smoker  . Smokeless tobacco: Never Used  Substance and Sexual Activity  . Alcohol use: Yes    Comment: 7 glasses of wine a week  . Drug use: No  . Sexual activity:  Never  Lifestyle  . Physical activity    Days per week: Not on file    Minutes per session: Not on file  . Stress: Not on file  Relationships  . Social Herbalist on phone: Not on file    Gets together: Not on file    Attends religious service: Not on file    Active member of club or organization: Not on file    Attends meetings of clubs or organizations: Not on file    Relationship status: Not on file  . Intimate partner violence    Fear of current or ex partner: Not on file    Emotionally abused: Not on file    Physically abused: Not on file    Forced sexual activity: Not on file  Other Topics Concern  . Not on file  Social History Narrative   Does have a living will.   Would desire CPR but would not want prolonged life support.   The PMH, PSH, Social History, Family History, Medications, and allergies have been reviewed in [facility], and have been updated if relevant.   Review of Systems  Constitutional: Negative.   Gastrointestinal: Positive for nausea. Negative for abdominal distention, abdominal pain, anal bleeding, blood in stool, constipation and diarrhea.  Neurological: Positive for dizziness. Negative for tremors, seizures, syncope, facial asymmetry, speech difficulty, weakness, light-headedness, numbness and headaches.  All other systems reviewed and are negative.      Objective:    BP 114/80 (BP Location: Left Arm, Patient Position: Sitting, Cuff Size: Normal)   Pulse 80   Temp 98.1 F (36.7 C) (Oral)   Ht 5\' 3"  (1.6 m)   Wt 89 lb 12.8 oz (40.7 kg)   SpO2 97%   BMI 15.91 kg/m   Wt Readings from Last 3 Encounters:  04/27/19 89 lb 12.8 oz (40.7 kg)  03/16/19 90 lb (40.8 kg)  10/14/18 96 lb 12.8 oz (43.9 kg)    Physical Exam Vitals signs and nursing note reviewed.  Constitutional:      General: She is not in acute distress.    Appearance: Normal appearance. She is normal weight.  HENT:     Head: Normocephalic and atraumatic.     Right Ear:  There is impacted cerumen.     Left Ear: There is impacted cerumen.     Nose: Nose normal.     Mouth/Throat:     Mouth: Mucous membranes are moist.  Eyes:  Extraocular Movements: Extraocular movements intact.     Right eye: Nystagmus present.     Left eye: Nystagmus present.     Comments: Nystagmus with dixhallpike  Neck:     Musculoskeletal: Normal range of motion.  Cardiovascular:     Rate and Rhythm: Normal rate and regular rhythm.     Pulses: Normal pulses.     Heart sounds: Normal heart sounds.  Pulmonary:     Effort: Pulmonary effort is normal.     Breath sounds: Normal breath sounds.  Musculoskeletal: Normal range of motion.  Skin:    General: Skin is warm and dry.  Neurological:     General: No focal deficit present.     Mental Status: She is alert and oriented to person, place, and time.  Psychiatric:        Mood and Affect: Mood normal.        Behavior: Behavior normal.        Thought Content: Thought content normal.        Judgment: Judgment normal.     .

## 2019-04-28 LAB — T3: T3, Total: 86 ng/dL (ref 76–181)

## 2019-05-11 ENCOUNTER — Telehealth: Payer: Self-pay | Admitting: Behavioral Health

## 2019-05-11 NOTE — Telephone Encounter (Signed)
Received Summary of Benefits for Prolia injection. No PA is required. The estimated out of pocket expense is $0.  Patient has been made aware and verbalized understanding. She voiced that she would like to schedule the nurse visit appointment at a later date; currently on vacation; also patient will have a DEXA completed on 06/02/2019.

## 2019-05-17 DIAGNOSIS — Z1211 Encounter for screening for malignant neoplasm of colon: Secondary | ICD-10-CM | POA: Diagnosis not present

## 2019-05-17 DIAGNOSIS — K219 Gastro-esophageal reflux disease without esophagitis: Secondary | ICD-10-CM | POA: Diagnosis not present

## 2019-05-17 DIAGNOSIS — K580 Irritable bowel syndrome with diarrhea: Secondary | ICD-10-CM | POA: Diagnosis not present

## 2019-05-20 ENCOUNTER — Telehealth: Payer: Self-pay | Admitting: Family Medicine

## 2019-05-20 NOTE — Telephone Encounter (Signed)
Pt called in to request to have a Rx for shingles shot sent in to his pharmacy?  Pharmacy:  [id number] [address], [place] [phone] (Phone) [phone] (Fax)

## 2019-05-23 MED ORDER — SHINGRIX 50 MCG/0.5ML IM SUSR: 0.5000 mL | Freq: Once | INTRAMUSCULAR | 1 refills | Status: AC

## 2019-05-23 NOTE — Telephone Encounter (Signed)
Okay to send Rx 

## 2019-05-23 NOTE — Telephone Encounter (Signed)
Yes absolutely! Thank you! 

## 2019-05-23 NOTE — Telephone Encounter (Signed)
Rx sent 

## 2019-05-31 ENCOUNTER — Telehealth: Payer: Self-pay | Admitting: Behavioral Health

## 2019-05-31 NOTE — Telephone Encounter (Signed)
Received Summary of Benefits for Prolia injection. No PA is required. The estimated out of pocket expense is $0.  Informed patient of the above information. She verbalized understanding. Patient also stated that she is "scheduled to have a bone density scan completed on this Thursday and once the results have been reviewed with PCP she will make a decision about the next injection."

## 2019-06-02 ENCOUNTER — Ambulatory Visit
Admission: RE | Admit: 2019-06-02 | Discharge: 2019-06-02 | Disposition: A | Discharge status: Home / Self Care | Payer: Medicare Other | Source: Ambulatory Visit | Attending: Family Medicine | Admitting: Family Medicine

## 2019-06-02 ENCOUNTER — Encounter: Payer: Self-pay | Admitting: Family Medicine

## 2019-06-02 ENCOUNTER — Other Ambulatory Visit: Payer: Self-pay

## 2019-06-02 DIAGNOSIS — Z1231 Encounter for screening mammogram for malignant neoplasm of breast: Secondary | ICD-10-CM

## 2019-06-02 DIAGNOSIS — Z780 Asymptomatic menopausal state: Secondary | ICD-10-CM

## 2019-06-02 DIAGNOSIS — M8589 Other specified disorders of bone density and structure, multiple sites: Secondary | ICD-10-CM | POA: Diagnosis not present

## 2019-06-14 DIAGNOSIS — D485 Neoplasm of uncertain behavior of skin: Secondary | ICD-10-CM | POA: Diagnosis not present

## 2019-06-14 DIAGNOSIS — L821 Other seborrheic keratosis: Secondary | ICD-10-CM | POA: Diagnosis not present

## 2019-06-14 NOTE — Telephone Encounter (Signed)
Pt stated she has had her Bone Density test and is ready to schedule Prolia injection. Requesting CB from [place]. Please advise.

## 2019-06-15 NOTE — Telephone Encounter (Addendum)
Nurse visit appointment has been scheduled for 06/23/19 at 10:40 AM for Prolia injection.

## 2019-06-23 ENCOUNTER — Other Ambulatory Visit: Payer: Self-pay

## 2019-06-23 ENCOUNTER — Ambulatory Visit (INDEPENDENT_AMBULATORY_CARE_PROVIDER_SITE_OTHER): Payer: Medicare Other

## 2019-06-23 ENCOUNTER — Encounter: Payer: Self-pay | Admitting: Family Medicine

## 2019-06-23 DIAGNOSIS — M810 Age-related osteoporosis without current pathological fracture: Secondary | ICD-10-CM

## 2019-06-23 MED ORDER — DENOSUMAB 60 MG/ML ~~LOC~~ SOSY
60.0000 mg | PREFILLED_SYRINGE | Freq: Once | SUBCUTANEOUS | Status: AC
  Administered 2019-06-23: 60 mg via SUBCUTANEOUS

## 2019-06-23 NOTE — Progress Notes (Signed)
Pt came into the office to get Prolia injection today, injection gave into the the left arm subcutaneous, pt tolerated injection well.

## 2019-07-05 NOTE — Progress Notes (Signed)
I reviewed health advisor's note, was available for consultation, and agree with documentation and plan.  

## 2019-07-14 DIAGNOSIS — H353131 Nonexudative age-related macular degeneration, bilateral, early dry stage: Secondary | ICD-10-CM | POA: Diagnosis not present

## 2019-07-26 ENCOUNTER — Telehealth: Payer: Self-pay | Admitting: Family Medicine

## 2019-07-26 NOTE — Telephone Encounter (Signed)
LMOV  For pt to call office to set up appt for February.

## 2019-09-06 DIAGNOSIS — Z85828 Personal history of other malignant neoplasm of skin: Secondary | ICD-10-CM | POA: Diagnosis not present

## 2019-09-06 DIAGNOSIS — Z08 Encounter for follow-up examination after completed treatment for malignant neoplasm: Secondary | ICD-10-CM | POA: Diagnosis not present

## 2019-09-06 DIAGNOSIS — X32XXXA Exposure to sunlight, initial encounter: Secondary | ICD-10-CM | POA: Diagnosis not present

## 2019-09-06 DIAGNOSIS — L570 Actinic keratosis: Secondary | ICD-10-CM | POA: Diagnosis not present

## 2019-09-06 DIAGNOSIS — L821 Other seborrheic keratosis: Secondary | ICD-10-CM | POA: Diagnosis not present

## 2019-09-19 ENCOUNTER — Other Ambulatory Visit: Payer: Self-pay

## 2019-09-19 MED ORDER — THYROID 15 MG PO TABS: 15.0000 mg | ORAL_TABLET | Freq: Every day | ORAL | 3 refills | Status: DC

## 2019-09-28 ENCOUNTER — Encounter: Payer: Self-pay | Admitting: Physical Therapy

## 2019-09-28 DIAGNOSIS — G8929 Other chronic pain: Secondary | ICD-10-CM

## 2019-09-28 DIAGNOSIS — M6281 Muscle weakness (generalized): Secondary | ICD-10-CM

## 2019-09-28 NOTE — Therapy (Signed)
[facility] [address], [place], [address] Phone: [phone]   Fax:  [phone]  Physical Therapy No-Visit Discharge Reporting Period: 11/23/2018 - 01/04/2019  Patient Details  Name: Deanna Cobb MRN: [id number] Date of Birth: [birth year] Referring Provider (PT): Lucille Passy, MD   Encounter Date: 09/28/2019    Past Medical History:  Diagnosis Date  . Accidental fall from chair   . Allergy    multiple food allergies  . Asthma    as a child  . Colitis 2012  . Coronary atherosclerosis    D/T lipid rich plaque  . GERD (gastroesophageal reflux disease)   . H. pylori infection 2012  . Heart murmur   . Hyperlipidemia   . Hypothyroidism   . Insomnia   . Internal hemorrhoids   . Mitral valve prolapse   . Osteoporosis   . Sarcoidosis 1966  . TIA (transient ischemic attack) 04/2005  . Visit for suture removal     Past Surgical History:  Procedure Laterality Date  . ABDOMINAL HYSTERECTOMY  1972  . LYMPH NODE BIOPSY  1966  . TONSILLECTOMY      There were no vitals filed for this visit.  Subjective Assessment - 09/28/19 1632    Subjective  Patient did not return following initial evaluation. Clinic temporarily closed due to precautions taken over rising COVID 19 cases and she did not return after re-opening.    Pertinent History  Patient is a 85 y.o. female who presents to outpatient physical therapy with a referral for medical diagnosis acute right-sided thoracic back pain. This patient's chief complaints consist of right sided low back pain that radiates into her glute region, leading to the following functional deficits: difficulty with prolonged standing, prolonged sitting, completing tasks in a slightly stooped position, moving into and out of flexion, picking something up off the floor without pushing off with hands. Relevant past medical history and comorbidities include osteoporosis (fell and  broke left patella, later had stress fracture on top of foot, 10 years ago or so), history of TIA (about 15-20 years ago, affected vision, no lingering effects), history of sarcoidosis (years ago, no problems).    Limitations  Sitting;House hold activities;Standing;Other (comment)   ifficulty with prolonged standing, prolonged sitting, completing tasks in a slightly stooped position, moving into and out of flexion, picking something up off the floor without pushing off with hands.   How long can you sit comfortably?  1 hour    How long can you stand comfortably?  2 hours    How long can you walk comfortably?  miles     Diagnostic tests  see chart for latest bone density scan    Patient Stated Goals  be able to pick things up off the floor of the grocery store without needing to push off with hands. move better with less pain.     Pain Onset  More than a month ago       OBJECTIVE Patient is not present for examination at this time. Please see previous documentation for latest objective data.     PT Short Term Goals - 09/28/19 1634      PT SHORT TERM GOAL #1   Title  Be independent with initial home exercise program for self-management of symptoms.    Baseline  initial HEP provided at IE (11/23/2018);     Time  2    Period  Weeks    Status  Not Met  Target Date  12/07/18        PT Long Term Goals - 09/28/19 1634      PT LONG TERM GOAL #1   Title  Be independent with a long-term home exercise program for self-management of symptoms.     Baseline  Initial HEP provided at IE (11/23/2018);     Time  6    Period  Weeks    Status  Not Met    Target Date  01/04/19      PT LONG TERM GOAL #2   Title  Demonstrate improved FOTO score by 10 units to demonstrate improvement in overall condition and self-reported functional ability.     Baseline  FOTO = 66 (11/23/2018);     Time  6    Period  Weeks    Status  Not Met    Target Date  01/04/19      PT LONG TERM GOAL #3   Title  Complete  community, work and/or recreational activities without limitation due to current condition.     Baseline  difficulty with prolonged standing, prolonged sitting, completing tasks in a slightly stooped position, moving into and out of flexion, picking something up off the floor without pushing off with hands. (11/23/2018);     Time  6    Period  Weeks    Status  Not Met    Target Date  01/04/19      PT LONG TERM GOAL #4   Title  Patient will be able to perform 5TSTS test in equal or less than 10 seconds to demonstrate improvement in muscular strength and power for funcitonal activities.     Baseline  14 seconds (11/23/2018);     Time  6    Period  Weeks    Status  Not Met    Target Date  01/04/19      PT LONG TERM GOAL #5   Title  Patient will be able to reach to toes and stand back up without experiencing her usual pain near 60 degrees of flexion to improve her ability to bend and pick things up off the floor.     Baseline  usual pain reported at 60 degrees flexion ascending and descending (11/23/2018);     Time  6    Period  Weeks    Status  Not Met    Target Date  01/04/19        Plan - 09/28/19 1637    Clinical Impression Statement  Patient attended initial evaluation only and did not return for follow up visits. Patient was unable to continue working towards goals due to lack of participation and is now discharged from physical therapy.    Personal Factors and Comorbidities  Age;Comorbidity 3+    Comorbidities  osteoporosis (fell and broke left patella, later had stress fracture on top of foot, 10 years ago or so), history of TIA (about 15-20 years ago, affected vision, no lingering effects), history of sarcoidosis (years ago, no problems), low body mass and muscle mass.    Examination-Activity Limitations  Stand;Bend;Squat;Carry;Lift;Other   picking things up from the ground   Examination-Participation Restrictions  Other;Shop;Meal Prep;Driving;Yard Work    Stability/Clinical  Decision Making  Stable/Uncomplicated    Rehab Potential  Good    PT Frequency  2x / week    PT Duration  6 weeks    PT Treatment/Interventions  ADLs/Self Care Home Management;Aquatic Therapy;Cryotherapy;Moist Heat;Therapeutic activities;Therapeutic exercise;Balance training;Gait training;Neuromuscular re-education;Patient/family education;Manual techniques;Passive range of motion;Dry needling;Taping;Other (  comment)   joint mobilizations grades I-IV   PT Next Visit Plan  patient is discharged from physical therapy    PT Vincent Access Code: [email or web address]     Consulted and Agree with Plan of Care  Patient       Patient will benefit from skilled therapeutic intervention in order to improve the following deficits and impairments:  Decreased mobility, Hypomobility, Decreased range of motion, Impaired perceived functional ability, Improper body mechanics, Decreased activity tolerance, Decreased strength, Pain, Postural dysfunction  Visit Diagnosis: Chronic right-sided low back pain, unspecified whether sciatica present  Muscle weakness (generalized)     Problem List Patient Active Problem List   Diagnosis Date Noted  . Dizziness 04/27/2019  . History of hypokalemia 04/27/2019  . Vitamin D deficiency 10/14/2018  . GERD (gastroesophageal reflux disease) 11/17/2017  . PAD (peripheral artery disease) ([place]) 11/15/2017  . Hearing loss in left ear 06/02/2016  . Squamous cell carcinoma, leg 05/30/2015  . IBS (irritable bowel syndrome) 07/27/2014  . Carotid atherosclerosis 06/29/2014  . Insomnia   . H. pylori infection 06/15/2012  . Osteoporosis 07/03/2011  . Cerumen impaction 06/11/2011  . History of TIAs 06/02/2011  . Hypothyroidism 03/18/2010  . GLUTEN ENTEROPATHY 03/18/2010  . HYPERLIPIDEMIA TYPE IIB / III 02/13/2009  . Mitral valve prolapse 02/02/2009    Everlean Alstrom. Graylon Good, PT, DPT 09/28/19, 4:37 PM  [facility] [address], [place], [address] Phone: [phone]   Fax:  [phone]  Name: Anaika Santillano MRN: [id number] Date of Birth: Jan 10, 1935

## 2019-09-29 ENCOUNTER — Ambulatory Visit: Payer: Medicare Other | Attending: Internal Medicine

## 2019-09-29 DIAGNOSIS — Z23 Encounter for immunization: Secondary | ICD-10-CM | POA: Insufficient documentation

## 2019-09-29 NOTE — Progress Notes (Signed)
   Covid-19 Vaccination Clinic  Name:  Deanna Cobb    MRN: [id number] DOB: [birth year]  09/29/2019  Deanna Cobb was observed post Covid-19 immunization for 15 minutes without incidence. She was provided with Vaccine Information Sheet and instruction to access the V-Safe system.   Deanna Cobb was instructed to call 911 with any severe reactions post vaccine: Marland Kitchen Difficulty breathing  . Swelling of your face and throat  . A fast heartbeat  . A bad rash all over your body  . Dizziness and weakness    Immunizations Administered    Name Date Dose VIS Date Route   Pfizer COVID-19 Vaccine 09/29/2019 10:58 AM 0.3 mL 08/19/2019 Intramuscular   Manufacturer: [place]   Lot: [id number]   [place]ten: [id number]

## 2019-10-11 NOTE — Progress Notes (Deleted)
Subjective:   Deanna Cobb is a 85 y.o. female who presents for Medicare Annual (Subsequent) preventive examination.  Pt enjoys playing golf with her husband.  Review of Systems:  Home Safety/Smoke Alarms: Feels safe in home. Smoke alarms in place.  Lives with husband and dog.   Female:     Mammo-  06/02/19     Dexa scan-  06/02/19         Objective:     Vitals: There were no vitals taken for this visit.  There is no height or weight on file to calculate BMI.  Advanced Directives 11/23/2018 10/06/2018 09/23/2017 04/22/2016  Does Patient Have a Medical Advance Directive? Yes Yes Yes No  Type of Paramedic of [place];Living will [place];Living will [place];Living will -  Does patient want to make changes to medical advance directive? No - Patient declined No - Patient declined No - Patient declined -  Copy of [place] in Chart? No - copy requested Yes - validated most recent copy scanned in chart (See row information) Yes -    Tobacco Social History   Tobacco Use  Smoking Status Never Smoker  Smokeless Tobacco Never Used     Counseling given: Not Answered   Clinical Intake:                       Past Medical History:  Diagnosis Date  . Accidental fall from chair   . Allergy    multiple food allergies  . Asthma    as a child  . Colitis 2012  . Coronary atherosclerosis    D/T lipid rich plaque  . GERD (gastroesophageal reflux disease)   . H. pylori infection 2012  . Heart murmur   . Hyperlipidemia   . Hypothyroidism   . Insomnia   . Internal hemorrhoids   . Mitral valve prolapse   . Osteoporosis   . Sarcoidosis 1966  . TIA (transient ischemic attack) 04/2005  . Visit for suture removal    Past Surgical History:  Procedure Laterality Date  . ABDOMINAL HYSTERECTOMY  1972  . LYMPH NODE BIOPSY  1966  . TONSILLECTOMY     Family History  Problem Relation Age  of Onset  . Throat cancer Mother   . Alcohol abuse Brother   . Heart disease Sister    Social History   Socioeconomic History  . Marital status: Married    Spouse name: Not on file  . Number of children: 4  . Years of education: Not on file  . Highest education level: Not on file  Occupational History  . Occupation: Retired-RN  Tobacco Use  . Smoking status: Never Smoker  . Smokeless tobacco: Never Used  Substance and Sexual Activity  . Alcohol use: Yes    Comment: 7 glasses of wine a week  . Drug use: No  . Sexual activity: Never  Other Topics Concern  . Not on file  Social History Narrative   Does have a living will.   Would desire CPR but would not want prolonged life support.   Social Determinants of Health   Financial Resource Strain:   . Difficulty of Paying Living Expenses: Not on file  Food Insecurity:   . Worried About Charity fundraiser in the Last Year: Not on file  . Ran Out of Food in the Last Year: Not on file  Transportation Needs:   . Lack  of Transportation (Medical): Not on file  . Lack of Transportation (Non-Medical): Not on file  Physical Activity:   . Days of Exercise per Week: Not on file  . Minutes of Exercise per Session: Not on file  Stress:   . Feeling of Stress : Not on file  Social Connections:   . Frequency of Communication with Friends and Family: Not on file  . Frequency of Social Gatherings with Friends and Family: Not on file  . Attends Religious Services: Not on file  . Active Member of Clubs or Organizations: Not on file  . Attends Archivist Meetings: Not on file  . Marital Status: Not on file    Outpatient Encounter Medications as of 10/12/2019  Medication Sig  . cholecalciferol (VITAMIN D) 1000 UNITS tablet Take 1,000 Units by mouth daily as needed.   . Coenzyme Q10 (COQ-10) 100 MG CAPS Take one by mouth daily  . Folate-B12-Intrinsic Factor (INTRINSI B12-FOLATE) B4643994 MCG-MCG-MG TABS Take 1 tablet by mouth  daily.   . Multiple Vitamins-Minerals (ICAPS AREDS 2 PO) Take by mouth.  . Multiple Vitamins-Minerals (MULTIVITAMIN ADULTS PO) Take by mouth. Emtero Vite  . Omega-3 Fatty Acids (EPA) 1000 MG CAPS Take 1 capsule by mouth 3 (three) times daily.   . Probiotic Product (PROBIOTIC ADVANCED PO) Take by mouth.  . thyroid (ARMOUR THYROID) 15 MG tablet Take 1 tablet (15 mg total) by mouth daily.  . TURMERIC PO Take by mouth daily.   Facility-Administered Encounter Medications as of 10/12/2019  Medication  . denosumab (PROLIA) injection 60 mg    Activities of Daily Living No flowsheet data found.  Patient Care Team: Lucille Passy, MD as PCP - General (Family Medicine) Richmond Campbell, MD as Consulting Physician (Gastroenterology) Barrie Dunker, MD as Consulting Physician (Dermatology) Lafayette Dragon, MD (Inactive) (Gastroenterology) Minna Merritts, MD as Consulting Physician (Cardiology)    Assessment:   This is a routine wellness examination for Deanna Cobb. Physical assessment deferred to PCP.  Exercise Activities and Dietary recommendations   Diet (meal preparation, eat out, water intake, caffeinated beverages, dairy products, fruits and vegetables): {Desc; diets:16563} Breakfast: Lunch:  Dinner:      Goals    . Maintain active healthy lifestyle. (pt-stated)       Fall Risk Fall Risk  04/27/2019 04/11/2019 10/06/2018 09/23/2017 06/02/2016  Falls in the past year? 0 (No Data) 0 No Yes  Comment - Emmi Telephone Survey: data to providers prior to load - - -  Number falls in past yr: - (No Data) - - -  Comment - Emmi Telephone Survey Actual Response =  - - -  Injury with Fall? - - - - Yes  Risk for fall due to : - - - - Other (Comment)  Follow up Falls evaluation completed - - - -   Depression Screen PHQ 2/9 Scores 10/06/2018 09/23/2017 06/02/2016 05/30/2015  PHQ - 2 Score 0 0 0 0     Cognitive Function Ad8 score reviewed for issues:  Issues making decisions:  Less interest in  hobbies / activities:  Repeats questions, stories (family complaining):  Trouble using ordinary gadgets (microwave, computer, phone):  Forgets the month or year:   Mismanaging finances:   Remembering appts:  Daily problems with thinking and/or memory: Ad8 score is=     MMSE - Mini Mental State Exam 09/23/2017  Orientation to time 5  Orientation to Place 5  Registration 3  Attention/ Calculation 5  Recall 2  Language- name  2 objects 2  Language- repeat 1  Language- follow 3 step command 3  Language- read & follow direction 1  Write a sentence 1  Copy design 1  Total score 29        Immunization History  Administered Date(s) Administered  . Influenza Split 06/11/2011, 06/15/2012  . Influenza, High Dose Seasonal PF 07/04/2017, 06/26/2018  . Influenza,inj,Quad PF,6+ Mos 05/11/2014, 06/02/2016  . PFIZER SARS-COV-2 Vaccination 09/29/2019  . Pneumococcal Conjugate-13 05/11/2014  . Pneumococcal Polysaccharide-23 03/21/2009  . Td 03/29/2009  . Zoster 03/16/2008   Screening Tests Health Maintenance  Topic Date Due  . TETANUS/TDAP  03/30/2019  . INFLUENZA VACCINE  04/09/2019  . DEXA SCAN  Completed  . PNA vac Low Risk Adult  Completed     Plan:   ***   I have personally reviewed and noted the following in the patient's chart:   . Medical and social history . Use of alcohol, tobacco or illicit drugs  . Current medications and supplements . Functional ability and status . Nutritional status . Physical activity . Advanced directives . List of other physicians . Hospitalizations, surgeries, and ER visits in previous 12 months . Vitals . Screenings to include cognitive, depression, and falls . Referrals and appointments  In addition, I have reviewed and discussed with patient certain preventive protocols, quality metrics, and best practice recommendations. A written personalized care plan for preventive services as well as general preventive health  recommendations were provided to patient.     Naaman Plummer [place], [place]  10/11/2019

## 2019-10-12 ENCOUNTER — Ambulatory Visit: Payer: Medicare Other | Admitting: *Deleted

## 2019-10-13 ENCOUNTER — Other Ambulatory Visit: Payer: Self-pay

## 2019-10-13 DIAGNOSIS — E039 Hypothyroidism, unspecified: Secondary | ICD-10-CM

## 2019-10-13 DIAGNOSIS — Z8639 Personal history of other endocrine, nutritional and metabolic disease: Secondary | ICD-10-CM

## 2019-10-13 DIAGNOSIS — E782 Mixed hyperlipidemia: Secondary | ICD-10-CM

## 2019-10-13 DIAGNOSIS — E559 Vitamin D deficiency, unspecified: Secondary | ICD-10-CM

## 2019-10-17 ENCOUNTER — Other Ambulatory Visit (INDEPENDENT_AMBULATORY_CARE_PROVIDER_SITE_OTHER): Payer: Medicare Other

## 2019-10-17 ENCOUNTER — Other Ambulatory Visit: Payer: Self-pay

## 2019-10-17 DIAGNOSIS — E039 Hypothyroidism, unspecified: Secondary | ICD-10-CM | POA: Diagnosis not present

## 2019-10-17 DIAGNOSIS — Z8639 Personal history of other endocrine, nutritional and metabolic disease: Secondary | ICD-10-CM

## 2019-10-17 DIAGNOSIS — E559 Vitamin D deficiency, unspecified: Secondary | ICD-10-CM | POA: Diagnosis not present

## 2019-10-17 DIAGNOSIS — E782 Mixed hyperlipidemia: Secondary | ICD-10-CM

## 2019-10-17 LAB — COMPREHENSIVE METABOLIC PANEL
ALT: 14 U/L (ref 0–35)
AST: 18 U/L (ref 0–37)
Albumin: 4.2 g/dL (ref 3.5–5.2)
Alkaline Phosphatase: 48 U/L (ref 39–117)
BUN: 16 mg/dL (ref 6–23)
CO2: 30 mEq/L (ref 19–32)
Calcium: 9.5 mg/dL (ref 8.4–10.5)
Chloride: 103 mEq/L (ref 96–112)
Creatinine, Ser: 0.74 mg/dL (ref 0.40–1.20)
GFR: 74.95 mL/min (ref >60.00)
Glucose, Bld: 86 mg/dL (ref 70–99)
Potassium: 4.3 mEq/L (ref 3.5–5.1)
Sodium: 141 mEq/L (ref 135–145)
Total Bilirubin: 1 mg/dL (ref 0.2–1.2)
Total Protein: 6.4 g/dL (ref 6.0–8.3)

## 2019-10-17 LAB — VITAMIN D 25 HYDROXY (VIT D DEFICIENCY, FRACTURES): VITD: 47.86 ng/mL (ref 30.00–100.00)

## 2019-10-17 LAB — TSH: TSH: 2.32 u[IU]/mL (ref 0.35–4.50)

## 2019-10-17 LAB — LIPID PANEL
Cholesterol: 221 mg/dL — ABNORMAL HIGH (ref 0–200)
HDL: 66.4 mg/dL (ref >39.00)
LDL Cholesterol: 140 mg/dL — ABNORMAL HIGH (ref 0–99)
NonHDL: 154.82
Total CHOL/HDL Ratio: 3
Triglycerides: 74 mg/dL (ref 0.0–149.0)
VLDL: 14.8 mg/dL (ref 0.0–40.0)

## 2019-10-17 LAB — T4, FREE: Free T4: 0.74 ng/dL (ref 0.60–1.60)

## 2019-10-18 LAB — T3: T3, Total: 90 ng/dL (ref 76–181)

## 2019-10-18 NOTE — Progress Notes (Deleted)
Subjective:   Deanna Cobb is a 85 y.o. female who presents for Medicare Annual (Subsequent) preventive examination.  Pt enjoys golfing w/ her husband.   Review of Systems:   Home Safety/Smoke Alarms: Feels safe in home. Smoke alarms in place.  Lives w/ husband and 1 dog.   Female:       Mammo- 06/02/19      Dexa scan-  06/02/19       Objective:     Vitals: There were no vitals taken for this visit.  There is no height or weight on file to calculate BMI.  Advanced Directives 11/23/2018 10/06/2018 09/23/2017 04/22/2016  Does Patient Have a Medical Advance Directive? Yes Yes Yes No  Type of Paramedic of [place];Living will [place];Living will [place];Living will -  Does patient want to make changes to medical advance directive? No - Patient declined No - Patient declined No - Patient declined -  Copy of [place] in Chart? No - copy requested Yes - validated most recent copy scanned in chart (See row information) Yes -    Tobacco Social History   Tobacco Use  Smoking Status Never Smoker  Smokeless Tobacco Never Used     Counseling given: Not Answered   Clinical Intake:                       Past Medical History:  Diagnosis Date  . Accidental fall from chair   . Allergy    multiple food allergies  . Asthma    as a child  . Colitis 2012  . Coronary atherosclerosis    D/T lipid rich plaque  . GERD (gastroesophageal reflux disease)   . H. pylori infection 2012  . Heart murmur   . Hyperlipidemia   . Hypothyroidism   . Insomnia   . Internal hemorrhoids   . Mitral valve prolapse   . Osteoporosis   . Sarcoidosis 1966  . TIA (transient ischemic attack) 04/2005  . Visit for suture removal    Past Surgical History:  Procedure Laterality Date  . ABDOMINAL HYSTERECTOMY  1972  . LYMPH NODE BIOPSY  1966  . TONSILLECTOMY     Family History  Problem Relation Age of  Onset  . Throat cancer Mother   . Alcohol abuse Brother   . Heart disease Sister    Social History   Socioeconomic History  . Marital status: Married    Spouse name: Not on file  . Number of children: 4  . Years of education: Not on file  . Highest education level: Not on file  Occupational History  . Occupation: Retired-RN  Tobacco Use  . Smoking status: Never Smoker  . Smokeless tobacco: Never Used  Substance and Sexual Activity  . Alcohol use: Yes    Comment: 7 glasses of wine a week  . Drug use: No  . Sexual activity: Never  Other Topics Concern  . Not on file  Social History Narrative   Does have a living will.   Would desire CPR but would not want prolonged life support.   Social Determinants of Health   Financial Resource Strain:   . Difficulty of Paying Living Expenses: Not on file  Food Insecurity:   . Worried About Charity fundraiser in the Last Year: Not on file  . Ran Out of Food in the Last Year: Not on file  Transportation Needs:   .  Lack of Transportation (Medical): Not on file  . Lack of Transportation (Non-Medical): Not on file  Physical Activity:   . Days of Exercise per Week: Not on file  . Minutes of Exercise per Session: Not on file  Stress:   . Feeling of Stress : Not on file  Social Connections:   . Frequency of Communication with Friends and Family: Not on file  . Frequency of Social Gatherings with Friends and Family: Not on file  . Attends Religious Services: Not on file  . Active Member of Clubs or Organizations: Not on file  . Attends Archivist Meetings: Not on file  . Marital Status: Not on file    Outpatient Encounter Medications as of 10/19/2019  Medication Sig  . cholecalciferol (VITAMIN D) 1000 UNITS tablet Take 1,000 Units by mouth daily as needed.   . Coenzyme Q10 (COQ-10) 100 MG CAPS Take one by mouth daily  . Folate-B12-Intrinsic Factor (INTRINSI B12-FOLATE) [id number] MCG-MCG-MG TABS Take 1 tablet by mouth  daily.   . Multiple Vitamins-Minerals (ICAPS AREDS 2 PO) Take by mouth.  . Multiple Vitamins-Minerals (MULTIVITAMIN ADULTS PO) Take by mouth. Emtero Vite  . Omega-3 Fatty Acids (EPA) 1000 MG CAPS Take 1 capsule by mouth 3 (three) times daily.   . Probiotic Product (PROBIOTIC ADVANCED PO) Take by mouth.  . thyroid (ARMOUR THYROID) 15 MG tablet Take 1 tablet (15 mg total) by mouth daily.  . TURMERIC PO Take by mouth daily.   Facility-Administered Encounter Medications as of 10/19/2019  Medication  . denosumab (PROLIA) injection 60 mg    Activities of Daily Living No flowsheet data found.  Patient Care Team: Lucille Passy, MD as PCP - General (Family Medicine) Richmond Campbell, MD as Consulting Physician (Gastroenterology) Barrie Dunker, MD as Consulting Physician (Dermatology) Lafayette Dragon, MD (Inactive) (Gastroenterology) Minna Merritts, MD as Consulting Physician (Cardiology)    Assessment:   This is a routine wellness examination for Deanna Cobb. Physical assessment deferred to PCP.  Exercise Activities and Dietary recommendations   Diet (meal preparation, eat out, water intake, caffeinated beverages, dairy products, fruits and vegetables): {Desc; diets:16563} Breakfast: Lunch:  Dinner:      Goals    . Maintain active healthy lifestyle. (pt-stated)       Fall Risk Fall Risk  04/27/2019 04/11/2019 10/06/2018 09/23/2017 06/02/2016  Falls in the past year? 0 (No Data) 0 No Yes  Comment - Emmi Telephone Survey: data to providers prior to load - - -  Number falls in past yr: - (No Data) - - -  Comment - Emmi Telephone Survey Actual Response =  - - -  Injury with Fall? - - - - Yes  Risk for fall due to : - - - - Other (Comment)  Follow up Falls evaluation completed - - - -   Depression Screen PHQ 2/9 Scores 10/06/2018 09/23/2017 06/02/2016 05/30/2015  PHQ - 2 Score 0 0 0 0     Cognitive Function Ad8 score reviewed for issues:  Issues making decisions:  Less interest in  hobbies / activities:  Repeats questions, stories (family complaining):  Trouble using ordinary gadgets (microwave, computer, phone):  Forgets the month or year:   Mismanaging finances:   Remembering appts:  Daily problems with thinking and/or memory: Ad8 score is=     MMSE - Mini Mental State Exam 09/23/2017  Orientation to time 5  Orientation to Place 5  Registration 3  Attention/ Calculation 5  Recall 2  Language-  name 2 objects 2  Language- repeat 1  Language- follow 3 step command 3  Language- read & follow direction 1  Write a sentence 1  Copy design 1  Total score 29        Immunization History  Administered Date(s) Administered  . Influenza Split 06/11/2011, 06/15/2012  . Influenza, High Dose Seasonal PF 07/04/2017, 06/26/2018  . Influenza,inj,Quad PF,6+ Mos 05/11/2014, 06/02/2016  . PFIZER SARS-COV-2 Vaccination 09/29/2019  . Pneumococcal Conjugate-13 05/11/2014  . Pneumococcal Polysaccharide-23 03/21/2009  . Td 03/29/2009  . Zoster 03/16/2008   Screening Tests Health Maintenance  Topic Date Due  . TETANUS/TDAP  03/30/2019  . INFLUENZA VACCINE  04/09/2019  . DEXA SCAN  Completed  . PNA vac Low Risk Adult  Completed     Plan:   ***   I have personally reviewed and noted the following in the patient's chart:   . Medical and social history . Use of alcohol, tobacco or illicit drugs  . Current medications and supplements . Functional ability and status . Nutritional status . Physical activity . Advanced directives . List of other physicians . Hospitalizations, surgeries, and ER visits in previous 12 months . Vitals . Screenings to include cognitive, depression, and falls . Referrals and appointments  In addition, I have reviewed and discussed with patient certain preventive protocols, quality metrics, and best practice recommendations. A written personalized care plan for preventive services as well as general preventive health  recommendations were provided to patient.     Naaman Plummer [place], [place]  10/18/2019

## 2019-10-19 ENCOUNTER — Ambulatory Visit: Payer: Medicare Other | Admitting: *Deleted

## 2019-10-20 ENCOUNTER — Ambulatory Visit: Payer: Medicare Other | Attending: Internal Medicine

## 2019-10-20 DIAGNOSIS — Z23 Encounter for immunization: Secondary | ICD-10-CM | POA: Insufficient documentation

## 2019-10-20 NOTE — Progress Notes (Signed)
   Covid-19 Vaccination Clinic  Name:  Deanna Cobb    MRN: [id number] DOB: 1934/10/30  10/20/2019  [name] was observed post Covid-19 immunization for 15 minutes without incidence. She was provided with Vaccine Information Sheet and instruction to access the V-Safe system.   [name] was instructed to call 911 with any severe reactions post vaccine: Marland Kitchen Difficulty breathing  . Swelling of your face and throat  . A fast heartbeat  . A bad rash all over your body  . Dizziness and weakness    Immunizations Administered    Name Date Dose VIS Date Route   Pfizer COVID-19 Vaccine 10/20/2019 11:47 AM 0.3 mL 08/19/2019 Intramuscular   Manufacturer: [place]   Lot: [id number]   Occidental: [id number]

## 2019-11-15 DIAGNOSIS — M9903 Segmental and somatic dysfunction of lumbar region: Secondary | ICD-10-CM | POA: Diagnosis not present

## 2019-11-15 DIAGNOSIS — M4716 Other spondylosis with myelopathy, lumbar region: Secondary | ICD-10-CM | POA: Diagnosis not present

## 2019-11-15 DIAGNOSIS — M5431 Sciatica, right side: Secondary | ICD-10-CM | POA: Diagnosis not present

## 2019-11-15 DIAGNOSIS — M9902 Segmental and somatic dysfunction of thoracic region: Secondary | ICD-10-CM | POA: Diagnosis not present

## 2019-12-19 ENCOUNTER — Telehealth: Payer: Self-pay | Admitting: General Practice

## 2019-12-19 NOTE — Telephone Encounter (Signed)
Patient is calling and wanted to see if it was time to get her Prolia injection. Pls advise. CB is [phone]

## 2019-12-20 NOTE — Telephone Encounter (Signed)
Returned call back to patient regarding next Prolia injection. Patient voiced that more than likely she will transfer her care to a provider at [facility]. Informed patient that once she has established care with a new provider at that office, it can be setup for her to begin receiving all future Prolia injections there. Patient voiced understanding and did not have any further questions or concerns.

## 2020-01-12 DIAGNOSIS — M5431 Sciatica, right side: Secondary | ICD-10-CM | POA: Diagnosis not present

## 2020-01-12 DIAGNOSIS — M4716 Other spondylosis with myelopathy, lumbar region: Secondary | ICD-10-CM | POA: Diagnosis not present

## 2020-01-12 DIAGNOSIS — M9902 Segmental and somatic dysfunction of thoracic region: Secondary | ICD-10-CM | POA: Diagnosis not present

## 2020-01-12 DIAGNOSIS — M9903 Segmental and somatic dysfunction of lumbar region: Secondary | ICD-10-CM | POA: Diagnosis not present

## 2020-02-09 DIAGNOSIS — M4716 Other spondylosis with myelopathy, lumbar region: Secondary | ICD-10-CM | POA: Diagnosis not present

## 2020-02-09 DIAGNOSIS — M5431 Sciatica, right side: Secondary | ICD-10-CM | POA: Diagnosis not present

## 2020-02-09 DIAGNOSIS — M9903 Segmental and somatic dysfunction of lumbar region: Secondary | ICD-10-CM | POA: Diagnosis not present

## 2020-02-09 DIAGNOSIS — M9902 Segmental and somatic dysfunction of thoracic region: Secondary | ICD-10-CM | POA: Diagnosis not present

## 2020-03-05 ENCOUNTER — Telehealth: Payer: Self-pay | Admitting: Family Medicine

## 2020-03-05 NOTE — Telephone Encounter (Signed)
Patient called today to schedule TOC visit with Dr Einar Pheasant Patient scheduled for 2nd week in Sept.  Patient stated that she is over due for her prolia and would like to know what she should do . Are we able to schedule her a acute visit to see dr cody just for the prolia or what should we do?

## 2020-03-05 NOTE — Telephone Encounter (Signed)
Acute visit is fine

## 2020-03-05 NOTE — Telephone Encounter (Signed)
Left message on V/M to call back to schedule Acute visit with Dr Einar Pheasant

## 2020-03-06 ENCOUNTER — Ambulatory Visit: Payer: Self-pay | Admitting: Family Medicine

## 2020-04-04 DIAGNOSIS — Z85828 Personal history of other malignant neoplasm of skin: Secondary | ICD-10-CM | POA: Diagnosis not present

## 2020-04-04 DIAGNOSIS — X32XXXA Exposure to sunlight, initial encounter: Secondary | ICD-10-CM | POA: Diagnosis not present

## 2020-04-04 DIAGNOSIS — L570 Actinic keratosis: Secondary | ICD-10-CM | POA: Diagnosis not present

## 2020-04-04 DIAGNOSIS — Z08 Encounter for follow-up examination after completed treatment for malignant neoplasm: Secondary | ICD-10-CM | POA: Diagnosis not present

## 2020-04-04 DIAGNOSIS — L821 Other seborrheic keratosis: Secondary | ICD-10-CM | POA: Diagnosis not present

## 2020-04-04 DIAGNOSIS — D225 Melanocytic nevi of trunk: Secondary | ICD-10-CM | POA: Diagnosis not present

## 2020-04-18 ENCOUNTER — Encounter: Payer: Self-pay | Admitting: Family Medicine

## 2020-05-21 ENCOUNTER — Encounter: Payer: Self-pay | Admitting: Family Medicine

## 2020-05-21 ENCOUNTER — Other Ambulatory Visit: Payer: Self-pay

## 2020-05-21 ENCOUNTER — Ambulatory Visit (INDEPENDENT_AMBULATORY_CARE_PROVIDER_SITE_OTHER): Payer: Medicare Other | Admitting: Family Medicine

## 2020-05-21 VITALS — BP 136/70 | HR 66 | Temp 97.6°F | Ht 62.25 in | Wt 90.5 lb

## 2020-05-21 DIAGNOSIS — I6523 Occlusion and stenosis of bilateral carotid arteries: Secondary | ICD-10-CM | POA: Diagnosis not present

## 2020-05-21 DIAGNOSIS — E039 Hypothyroidism, unspecified: Secondary | ICD-10-CM

## 2020-05-21 DIAGNOSIS — R32 Unspecified urinary incontinence: Secondary | ICD-10-CM | POA: Diagnosis not present

## 2020-05-21 DIAGNOSIS — Z23 Encounter for immunization: Secondary | ICD-10-CM

## 2020-05-21 DIAGNOSIS — K589 Irritable bowel syndrome without diarrhea: Secondary | ICD-10-CM | POA: Diagnosis not present

## 2020-05-21 DIAGNOSIS — M810 Age-related osteoporosis without current pathological fracture: Secondary | ICD-10-CM | POA: Diagnosis not present

## 2020-05-21 NOTE — Assessment & Plan Note (Signed)
Encouraged pelvic floor PT as pt not interested in surgery. Discussed possible pessary as non-surgical option if PT does not help

## 2020-05-21 NOTE — Patient Instructions (Addendum)
I will look into other osteoporosis treatment options and send a mychart within one week.   Could also consider repeating bone density scan in 6 months or so   Denosumab injection What is this medicine? DENOSUMAB (den oh sue mab) slows bone breakdown. Prolia is used to treat osteoporosis in women after menopause and in men, and in people who are taking corticosteroids for 6 months or more. Delton See is used to treat a high calcium level due to cancer and to prevent bone fractures and other bone problems caused by multiple myeloma or cancer bone metastases. Delton See is also used to treat giant cell tumor of the bone. This medicine may be used for other purposes; ask your health care provider or pharmacist if you have questions. COMMON BRAND NAME(S): Prolia, XGEVA What should I tell my health care provider before I take this medicine? They need to know if you have any of these conditions:  dental disease  having surgery or tooth extraction  infection  kidney disease  low levels of calcium or Vitamin D in the blood  malnutrition  on hemodialysis  skin conditions or sensitivity  thyroid or parathyroid disease  an unusual reaction to denosumab, other medicines, foods, dyes, or preservatives  pregnant or trying to get pregnant  breast-feeding How should I use this medicine? This medicine is for injection under the skin. It is given by a health care professional in a hospital or clinic setting. A special MedGuide will be given to you before each treatment. Be sure to read this information carefully each time. For Prolia, talk to your pediatrician regarding the use of this medicine in children. Special care may be needed. For Delton See, talk to your pediatrician regarding the use of this medicine in children. While this drug may be prescribed for children as young as 13 years for selected conditions, precautions do apply. Overdosage: If you think you have taken too much of this medicine contact  a poison control center or emergency room at once. NOTE: This medicine is only for you. Do not share this medicine with others. What if I miss a dose? It is important not to miss your dose. Call your doctor or health care professional if you are unable to keep an appointment. What may interact with this medicine? Do not take this medicine with any of the following medications:  other medicines containing denosumab This medicine may also interact with the following medications:  medicines that lower your chance of fighting infection  steroid medicines like prednisone or cortisone This list may not describe all possible interactions. Give your health care provider a list of all the medicines, herbs, non-prescription drugs, or dietary supplements you use. Also tell them if you smoke, drink alcohol, or use illegal drugs. Some items may interact with your medicine. What should I watch for while using this medicine? Visit your doctor or health care professional for regular checks on your progress. Your doctor or health care professional may order blood tests and other tests to see how you are doing. Call your doctor or health care professional for advice if you get a fever, chills or sore throat, or other symptoms of a cold or flu. Do not treat yourself. This drug may decrease your body's ability to fight infection. Try to avoid being around people who are sick. You should make sure you get enough calcium and vitamin D while you are taking this medicine, unless your doctor tells you not to. Discuss the foods you eat and the  vitamins you take with your health care professional. See your dentist regularly. Brush and floss your teeth as directed. Before you have any dental work done, tell your dentist you are receiving this medicine. Do not become pregnant while taking this medicine or for 5 months after stopping it. Talk with your doctor or health care professional about your birth control options while  taking this medicine. Women should inform their doctor if they wish to become pregnant or think they might be pregnant. There is a potential for serious side effects to an unborn child. Talk to your health care professional or pharmacist for more information. What side effects may I notice from receiving this medicine? Side effects that you should report to your doctor or health care professional as soon as possible:  allergic reactions like skin rash, itching or hives, swelling of the face, lips, or tongue  bone pain  breathing problems  dizziness  jaw pain, especially after dental work  redness, blistering, peeling of the skin  signs and symptoms of infection like fever or chills; cough; sore throat; pain or trouble passing urine  signs of low calcium like fast heartbeat, muscle cramps or muscle pain; pain, tingling, numbness in the hands or feet; seizures  unusual bleeding or bruising  unusually weak or tired Side effects that usually do not require medical attention (report to your doctor or health care professional if they continue or are bothersome):  constipation  diarrhea  headache  joint pain  loss of appetite  muscle pain  runny nose  tiredness  upset stomach This list may not describe all possible side effects. Call your doctor for medical advice about side effects. You may report side effects to FDA at 1-800-FDA-1088. Where should I keep my medicine? This medicine is only given in a clinic, doctor's office, or other health care setting and will not be stored at home. NOTE: This sheet is a summary. It may not cover all possible information. If you have questions about this medicine, talk to your doctor, pharmacist, or health care provider.  2020 Elsevier/Gold Standard (2018-01-01 16:10:44)

## 2020-05-21 NOTE — Assessment & Plan Note (Signed)
Doing IBS clear supplement and trial and error of diet with some improvement. Both constipation/diarrhea

## 2020-05-21 NOTE — Assessment & Plan Note (Signed)
Lab Results  Component Value Date   TSH 2.32 10/17/2019   At goal. Cont medication

## 2020-05-21 NOTE — Assessment & Plan Note (Signed)
Pt on prolia but last dose was due in February 2021. Discussed that she has been on this for >5 years and failed bisphosphonate therapy. Advised endocrinology and she declined initially. Upon further review of the literature, highly recommended endocrinology consult and advised starting bisphosphonate while waiting as last dose of prolia likely 1 year ago.

## 2020-05-21 NOTE — Progress Notes (Signed)
Subjective:     Deanna Cobb is a 85 y.o. female presenting for Establish Care     HPI   #Osteoporosis - was getting prolia injections - would like to continue - Fosamax    #bladder control issues - worse at night - getting up 1-2 times a night - having accidents getting up - cough and sneezing leakage - has to know where bathrooms are if out and about   Review of Systems   Social History   Tobacco Use  Smoking Status Never Smoker  Smokeless Tobacco Never Used        Objective:    BP Readings from Last 3 Encounters:  05/21/20 136/70  04/27/19 114/80  03/16/19 112/80   Wt Readings from Last 3 Encounters:  05/21/20 90 lb 8 oz (41.1 kg)  04/27/19 89 lb 12.8 oz (40.7 kg)  03/16/19 90 lb (40.8 kg)    BP 136/70   Pulse 66   Temp 97.6 F (36.4 C) (Temporal)   Ht 5' 2.25" (1.581 m)   Wt 90 lb 8 oz (41.1 kg)   SpO2 99%   BMI 16.42 kg/m    Physical Exam Constitutional:      General: She is not in acute distress.    Appearance: She is well-developed. She is not diaphoretic.  HENT:     Right Ear: External ear normal.     Left Ear: External ear normal.     Nose: Nose normal.  Eyes:     Conjunctiva/sclera: Conjunctivae normal.  Cardiovascular:     Rate and Rhythm: Normal rate and regular rhythm.     Heart sounds: No murmur heard.   Pulmonary:     Effort: Pulmonary effort is normal. No respiratory distress.     Breath sounds: Normal breath sounds. No wheezing.  Musculoskeletal:     Cervical back: Neck supple.  Skin:    General: Skin is warm and dry.     Capillary Refill: Capillary refill takes less than 2 seconds.  Neurological:     Mental Status: She is alert. Mental status is at baseline.  Psychiatric:        Mood and Affect: Mood normal.        Behavior: Behavior normal.           Assessment & Plan:   Problem List Items Addressed This Visit      Cardiovascular and Mediastinum   Carotid atherosclerosis    Declined statin.  Was following with cardiology. bp normal        Digestive   IBS (irritable bowel syndrome)    Doing IBS clear supplement and trial and error of diet with some improvement. Both constipation/diarrhea        Endocrine   Hypothyroidism    Lab Results  Component Value Date   TSH 2.32 10/17/2019   At goal. Cont medication        Musculoskeletal and Integument   Osteoporosis - Primary    Pt on prolia but last dose was due in February 2021. Discussed that she has been on this for >5 years and failed bisphosphonate therapy. Advised endocrinology and she declined initially. Upon further review of the literature, highly recommended endocrinology consult and advised starting bisphosphonate while waiting as last dose of prolia likely 1 year ago.         Other   Urinary incontinence in female    Encouraged pelvic floor PT as pt not interested in surgery. Discussed possible pessary as non-surgical  option if PT does not help       Relevant Orders   Ambulatory referral to Physical Therapy    Other Visit Diagnoses    Need for influenza vaccination       Relevant Orders   Flu Vaccine QUAD High Dose(Fluad) (Completed)       Return if symptoms worsen or fail to improve.  Lesleigh Noe, MD  This visit occurred during the SARS-CoV-2 public health emergency.  Safety protocols were in place, including screening questions prior to the visit, additional usage of staff PPE, and extensive cleaning of exam room while observing appropriate contact time as indicated for disinfecting solutions.

## 2020-05-21 NOTE — Assessment & Plan Note (Addendum)
Declined statin. Was following with cardiology. bp normal

## 2020-05-22 MED ORDER — ALENDRONATE SODIUM 70 MG PO TABS: 70.0000 mg | ORAL_TABLET | ORAL | 3 refills | Status: DC

## 2020-05-22 NOTE — Addendum Note (Signed)
Addended by: Lesleigh Noe on: 05/22/2020 05:08 PM   Modules accepted: Orders

## 2020-05-29 DIAGNOSIS — M9903 Segmental and somatic dysfunction of lumbar region: Secondary | ICD-10-CM | POA: Diagnosis not present

## 2020-05-29 DIAGNOSIS — M4716 Other spondylosis with myelopathy, lumbar region: Secondary | ICD-10-CM | POA: Diagnosis not present

## 2020-05-29 DIAGNOSIS — M9902 Segmental and somatic dysfunction of thoracic region: Secondary | ICD-10-CM | POA: Diagnosis not present

## 2020-05-29 DIAGNOSIS — M5431 Sciatica, right side: Secondary | ICD-10-CM | POA: Diagnosis not present

## 2020-06-01 DIAGNOSIS — M810 Age-related osteoporosis without current pathological fracture: Secondary | ICD-10-CM | POA: Diagnosis not present

## 2020-07-12 ENCOUNTER — Other Ambulatory Visit: Payer: Self-pay | Admitting: Family Medicine

## 2020-07-12 DIAGNOSIS — Z1231 Encounter for screening mammogram for malignant neoplasm of breast: Secondary | ICD-10-CM

## 2020-07-17 DIAGNOSIS — M5489 Other dorsalgia: Secondary | ICD-10-CM | POA: Diagnosis not present

## 2020-07-17 DIAGNOSIS — M546 Pain in thoracic spine: Secondary | ICD-10-CM | POA: Diagnosis not present

## 2020-07-25 DIAGNOSIS — M4716 Other spondylosis with myelopathy, lumbar region: Secondary | ICD-10-CM | POA: Diagnosis not present

## 2020-07-25 DIAGNOSIS — M9903 Segmental and somatic dysfunction of lumbar region: Secondary | ICD-10-CM | POA: Diagnosis not present

## 2020-07-25 DIAGNOSIS — M9902 Segmental and somatic dysfunction of thoracic region: Secondary | ICD-10-CM | POA: Diagnosis not present

## 2020-07-25 DIAGNOSIS — M5431 Sciatica, right side: Secondary | ICD-10-CM | POA: Diagnosis not present

## 2020-07-27 ENCOUNTER — Ambulatory Visit (INDEPENDENT_AMBULATORY_CARE_PROVIDER_SITE_OTHER): Payer: Medicare Other | Admitting: Endocrinology

## 2020-07-27 ENCOUNTER — Other Ambulatory Visit: Payer: Self-pay

## 2020-07-27 ENCOUNTER — Encounter: Payer: Self-pay | Admitting: Endocrinology

## 2020-07-27 VITALS — BP 128/78 | HR 81 | Ht 63.25 in | Wt 89.2 lb

## 2020-07-27 DIAGNOSIS — M810 Age-related osteoporosis without current pathological fracture: Secondary | ICD-10-CM

## 2020-07-27 DIAGNOSIS — I6523 Occlusion and stenosis of bilateral carotid arteries: Secondary | ICD-10-CM | POA: Diagnosis not present

## 2020-07-27 NOTE — Progress Notes (Signed)
Subjective:    Patient ID: Christoper Allegra, female    DOB: April 02, 1935, 85 y.o.   MRN: [id number]  HPI Pt is referred by Dr Einar Pheasant, for osteoporosis.  Pt was noted to have osteoporosis in 2011.  she has had these bony fractures: left foot (2011--non-traumatic) and left knee (2013--traumatic).  She had TSH (no BSO) in 1970.  She has no history of any of the following: multiple myeloma, renal dz, prolonged bedrest, steroids, alcoholism, smoking, liver dz, and hyperparathyroidism.  She does not take heparin or anticonvulsants.  She took Fosamax 2011-2013.  She stopped, because she did not like taking it.  She then took Prolia 2016-2020.  She stopped, due to a gap in having a primary care provider.  She takes Vit-D, 1000 units/day.  She has lost a few lbs, x 1 year. She has occasional falls, low back pain, and muscle cramps.   Past Medical History:  Diagnosis Date  . Accidental fall from chair   . Allergy    multiple food allergies  . Asthma    as a child  . Colitis 2012  . Coronary atherosclerosis    D/T lipid rich plaque  . GERD (gastroesophageal reflux disease)   . H. pylori infection 2012  . Heart murmur   . Hyperlipidemia   . Hypothyroidism   . Insomnia   . Internal hemorrhoids   . Mitral valve prolapse   . Osteoporosis   . Sarcoidosis 1966  . TIA (transient ischemic attack) 04/2005  . Visit for suture removal     Past Surgical History:  Procedure Laterality Date  . ABDOMINAL HYSTERECTOMY  1972  . LYMPH NODE BIOPSY  1966  . TONSILLECTOMY      Social History   Socioeconomic History  . Marital status: Married    Spouse name: Jenny Reichmann  . Number of children: 4  . Years of education: Nursing school  . Highest education level: Not on file  Occupational History  . Occupation: Retired-RN  Tobacco Use  . Smoking status: Never Smoker  . Smokeless tobacco: Never Used  Substance and Sexual Activity  . Alcohol use: Yes    Comment: on glass of wine daily  . Drug use: No  . Sexual  activity: Not Currently  Other Topics Concern  . Not on file  Social History Narrative   Does have a living will.   Would desire CPR but would not want prolonged life support.      05/21/20   From: [place] since [place]   Living: with husband, John (1960)   Work: retired Therapist, sports      Family: 4 children - [place] Flattery, [place], [place] Pepper - 9 grandchildren - some in [place]      Enjoys: traveling abroad, walking daily, yoga, golf      Exercise: speed walk 1 hour daily, yoga   Diet: limited due to IBS - toast with almond butter for breakfast, meal prep launch (salad), cook twice weekly      Safety   Seat belts: Yes    Guns: Yes  and secure   Safe in relationships: Yes    Social Determinants of Health   Financial Resource Strain:   . Difficulty of Paying Living Expenses: Not on file  Food Insecurity:   . Worried About Charity fundraiser in the Last Year: Not on file  . Ran Out of Food in the Last Year: Not on file  Transportation Needs:   . Lack of Transportation (Medical):  Not on file  . Lack of Transportation (Non-Medical): Not on file  Physical Activity:   . Days of Exercise per Week: Not on file  . Minutes of Exercise per Session: Not on file  Stress:   . Feeling of Stress : Not on file  Social Connections:   . Frequency of Communication with Friends and Family: Not on file  . Frequency of Social Gatherings with Friends and Family: Not on file  . Attends Religious Services: Not on file  . Active Member of Clubs or Organizations: Not on file  . Attends Archivist Meetings: Not on file  . Marital Status: Not on file  Intimate Partner Violence:   . Fear of Current or Ex-Partner: Not on file  . Emotionally Abused: Not on file  . Physically Abused: Not on file  . Sexually Abused: Not on file    Current Outpatient Medications on File Prior to Visit  Medication Sig Dispense Refill  . cholecalciferol (VITAMIN D) 1000 UNITS tablet Take 1,000 Units by mouth daily as  needed.     [name] (INTRINSI B12-FOLATE) [phone] MCG-MCG-MG TABS Take 1 tablet by mouth daily.     . Multiple Vitamins-Minerals (ICAPS AREDS 2 PO) Take by mouth.    . Multiple Vitamins-Minerals (MULTIVITAMIN ADULTS PO) Take by mouth. Emtero Vite    . NON FORMULARY in the morning and at bedtime. IBS clear supplement    . Omega-3 Fatty Acids (EPA) 1000 MG CAPS Take 1 capsule by mouth 3 (three) times daily.     . Probiotic Product (PROBIOTIC ADVANCED PO) Take by mouth.    . thyroid (ARMOUR THYROID) 15 MG tablet Take 1 tablet (15 mg total) by mouth daily. 90 tablet 3  . TURMERIC PO Take by mouth daily. (Patient not taking: Reported on 07/27/2020)     Current Facility-Administered Medications on File Prior to Visit  Medication Dose Route Frequency Provider Last Rate Last Admin  . denosumab (PROLIA) injection 60 mg  60 mg Subcutaneous Q6 months Lucille Passy, MD   60 mg at 08/27/16 1640    Allergies  Allergen Reactions  . Clindamycin/Lincomycin   . Penicillins     REACTION: hives    Family History  Problem Relation Age of Onset  . Throat cancer Mother   . Alcohol abuse Brother   . Heart disease Sister   . Heart attack Maternal Grandmother   . Osteoporosis Neg Hx     BP 128/78   Pulse 81   Ht 5' 3.25" (1.607 m)   Wt 89 lb 3.2 oz (40.5 kg)   SpO2 99%   BMI 15.68 kg/m    Review of Systems denies heartburn, cold intolerance, and memory loss.      Objective:   Physical Exam VITAL SIGNS:  See vs page GENERAL: no distress NECK: There is no palpable thyroid enlargement.  No thyroid nodule is palpable.  No palpable lymphadenopathy at the anterior neck. CHEST WALL: no kyphosis.   GAIT: normal and steady.   DEXA (2020): Femur Total Right is 0.707 g/cm2 with a T-score of -2.4.   25-OH vit-D=48  Lab Results  Component Value Date   TSH 2.32 10/17/2019   T3TOTAL 90 10/17/2019   I have reviewed outside records, and summarized: Pt was noted to have  osteoporosis, and referred here.  It was noted that she was not recently on rx.  She was advised to resume rx     Assessment & Plan:  Osteoporosis, new  to me.  Uncontrolled.  I advised pt to take both Reclast and Prolia.  Pt says she would like to think over rx, before resuming rx.  She declines Forteo  Patient Instructions  Please continue the same Vitamin-D Please consider the treatment options we discussed, and let us know

## 2020-07-27 NOTE — Patient Instructions (Signed)
Please continue the same Vitamin-D Please consider the treatment options we discussed, and let us know

## 2020-08-18 ENCOUNTER — Encounter: Payer: Self-pay | Admitting: Family Medicine

## 2020-08-18 DIAGNOSIS — M810 Age-related osteoporosis without current pathological fracture: Secondary | ICD-10-CM

## 2020-08-20 ENCOUNTER — Telehealth: Payer: Self-pay

## 2020-08-20 NOTE — Telephone Encounter (Signed)
I called patient, she states that she would like to just wait on the reclast at this time- she will let us know when she has decided so we can fill out forms.   Will trash previous paperwork that was completed-  Patient verbalized understanding.

## 2020-08-22 ENCOUNTER — Ambulatory Visit
Admission: RE | Admit: 2020-08-22 | Discharge: 2020-08-22 | Disposition: A | Discharge status: Home / Self Care | Payer: Medicare Other | Source: Ambulatory Visit | Attending: Family Medicine | Admitting: Family Medicine

## 2020-08-22 ENCOUNTER — Other Ambulatory Visit: Payer: Self-pay

## 2020-08-22 DIAGNOSIS — Z1231 Encounter for screening mammogram for malignant neoplasm of breast: Secondary | ICD-10-CM | POA: Diagnosis not present

## 2020-08-22 DIAGNOSIS — H353131 Nonexudative age-related macular degeneration, bilateral, early dry stage: Secondary | ICD-10-CM | POA: Diagnosis not present

## 2020-09-18 DIAGNOSIS — M9902 Segmental and somatic dysfunction of thoracic region: Secondary | ICD-10-CM | POA: Diagnosis not present

## 2020-09-18 DIAGNOSIS — M4716 Other spondylosis with myelopathy, lumbar region: Secondary | ICD-10-CM | POA: Diagnosis not present

## 2020-09-18 DIAGNOSIS — M9903 Segmental and somatic dysfunction of lumbar region: Secondary | ICD-10-CM | POA: Diagnosis not present

## 2020-09-18 DIAGNOSIS — M5431 Sciatica, right side: Secondary | ICD-10-CM | POA: Diagnosis not present

## 2020-09-20 DIAGNOSIS — M9903 Segmental and somatic dysfunction of lumbar region: Secondary | ICD-10-CM | POA: Diagnosis not present

## 2020-09-20 DIAGNOSIS — M5431 Sciatica, right side: Secondary | ICD-10-CM | POA: Diagnosis not present

## 2020-09-20 DIAGNOSIS — M9902 Segmental and somatic dysfunction of thoracic region: Secondary | ICD-10-CM | POA: Diagnosis not present

## 2020-09-20 DIAGNOSIS — M4716 Other spondylosis with myelopathy, lumbar region: Secondary | ICD-10-CM | POA: Diagnosis not present

## 2020-09-25 ENCOUNTER — Telehealth: Payer: Self-pay | Admitting: Nutrition

## 2020-09-25 NOTE — Telephone Encounter (Signed)
Patient requesting reclast infusion from Dr. Cordelia Pen visit on 11/19.  Please call her to schedule lab work.  This must be done before we can do the reclast.  Thank you

## 2020-10-03 ENCOUNTER — Telehealth: Payer: Self-pay | Admitting: Nutrition

## 2020-10-03 NOTE — Telephone Encounter (Signed)
LVM saying we need blood work before we can do reclast infusion.  Gave telephone number to call office to schedule blood work.

## 2020-10-22 ENCOUNTER — Other Ambulatory Visit: Payer: Self-pay | Admitting: *Deleted

## 2020-10-22 DIAGNOSIS — E039 Hypothyroidism, unspecified: Secondary | ICD-10-CM

## 2020-10-22 MED ORDER — THYROID 15 MG PO TABS: 15.0000 mg | ORAL_TABLET | Freq: Every day | ORAL | 0 refills | Status: DC

## 2020-10-22 NOTE — Telephone Encounter (Signed)
Last office visit 05/21/2020 for establish care.  Last refilled 09/19/2019 for #90 with 3 refills by Dr. Deborra Medina.  Last thyroid labs 10/17/2019.  Ok to refill?

## 2020-10-22 NOTE — Telephone Encounter (Signed)
Please have pt schedule annual wellness visit in next 3 months to check labs and review preventative care

## 2020-10-25 NOTE — Telephone Encounter (Signed)
Called and scheduled patient for AWV for 2022.

## 2020-11-08 NOTE — Telephone Encounter (Signed)
Received approval from [facility] for [place] thyroid rx. Approval good from 09/08/20-09/07/21.

## 2020-11-20 DIAGNOSIS — M9902 Segmental and somatic dysfunction of thoracic region: Secondary | ICD-10-CM | POA: Diagnosis not present

## 2020-11-20 DIAGNOSIS — M4716 Other spondylosis with myelopathy, lumbar region: Secondary | ICD-10-CM | POA: Diagnosis not present

## 2020-11-20 DIAGNOSIS — M9903 Segmental and somatic dysfunction of lumbar region: Secondary | ICD-10-CM | POA: Diagnosis not present

## 2020-11-20 DIAGNOSIS — M5431 Sciatica, right side: Secondary | ICD-10-CM | POA: Diagnosis not present

## 2020-12-13 DIAGNOSIS — Z23 Encounter for immunization: Secondary | ICD-10-CM | POA: Diagnosis not present

## 2021-01-22 ENCOUNTER — Other Ambulatory Visit: Payer: Self-pay

## 2021-01-22 ENCOUNTER — Other Ambulatory Visit: Payer: Self-pay | Admitting: Family Medicine

## 2021-01-22 ENCOUNTER — Ambulatory Visit (INDEPENDENT_AMBULATORY_CARE_PROVIDER_SITE_OTHER): Payer: Medicare Other

## 2021-01-22 DIAGNOSIS — E039 Hypothyroidism, unspecified: Secondary | ICD-10-CM

## 2021-01-22 DIAGNOSIS — Z0000 Encounter for general adult medical examination without abnormal findings: Secondary | ICD-10-CM

## 2021-01-22 NOTE — Patient Instructions (Signed)
Deanna Cobb , Thank you for taking time to come for your Medicare Wellness Visit. I appreciate your ongoing commitment to your health goals. Please review the following plan we discussed and let me know if I can assist you in the future.   Screening recommendations/referrals: Colonoscopy: no longer required  Mammogram: Up to date, completed 08/22/2020, due 08/2021 Bone Density: Up to date, completed 06/02/2019, due 05/2021 Recommended yearly ophthalmology/optometry visit for glaucoma screening and checkup Recommended yearly dental visit for hygiene and checkup  Vaccinations: Influenza vaccine: Up to date, completed 05/21/2020, due 04/2021 Pneumococcal vaccine: Completed series Tdap vaccine: decline-insurance  Shingles vaccine: Completed series   Covid-19:Completed series  Advanced directives: Please bring a copy of your POA (Power of Attorney) and/or Living Will to your next appointment.   Conditions/risks identified: hyperlipidemia   Next appointment: Follow up in one year for your annual wellness visit    Preventive Care 65 Years and Older, Female Preventive care refers to lifestyle choices and visits with your health care provider that can promote health and wellness. What does preventive care include?  A yearly physical exam. This is also called an annual well check.  Dental exams once or twice a year.  Routine eye exams. Ask your health care provider how often you should have your eyes checked.  Personal lifestyle choices, including:  Daily care of your teeth and gums.  Regular physical activity.  Eating a healthy diet.  Avoiding tobacco and drug use.  Limiting alcohol use.  Practicing safe sex.  Taking low-dose aspirin every day.  Taking vitamin and mineral supplements as recommended by your health care provider. What happens during an annual well check? The services and screenings done by your health care provider during your annual well check will depend on your  age, overall health, lifestyle risk factors, and family history of disease. Counseling  Your health care provider may ask you questions about your:  Alcohol use.  Tobacco use.  Drug use.  Emotional well-being.  Home and relationship well-being.  Sexual activity.  Eating habits.  History of falls.  Memory and ability to understand (cognition).  Work and work Statistician.  Reproductive health. Screening  You may have the following tests or measurements:  Height, weight, and BMI.  Blood pressure.  Lipid and cholesterol levels. These may be checked every 5 years, or more frequently if you are over 67 years old.  Skin check.  Lung cancer screening. You may have this screening every year starting at age 50 if you have a 30-pack-year history of smoking and currently smoke or have quit within the past 15 years.  Fecal occult blood test (FOBT) of the stool. You may have this test every year starting at age 41.  Flexible sigmoidoscopy or colonoscopy. You may have a sigmoidoscopy every 5 years or a colonoscopy every 10 years starting at age 70.  Hepatitis C blood test.  Hepatitis B blood test.  Sexually transmitted disease (STD) testing.  Diabetes screening. This is done by checking your blood sugar (glucose) after you have not eaten for a while (fasting). You may have this done every 1-3 years.  Bone density scan. This is done to screen for osteoporosis. You may have this done starting at age 15.  Mammogram. This may be done every 1-2 years. Talk to your health care provider about how often you should have regular mammograms. Talk with your health care provider about your test results, treatment options, and if necessary, the need for more tests. Vaccines  Your health care provider may recommend certain vaccines, such as:  Influenza vaccine. This is recommended every year.  Tetanus, diphtheria, and acellular pertussis (Tdap, Td) vaccine. You may need a Td booster  every 10 years.  Zoster vaccine. You may need this after age 31.  Pneumococcal 13-valent conjugate (PCV13) vaccine. One dose is recommended after age 47.  Pneumococcal polysaccharide (PPSV23) vaccine. One dose is recommended after age 60. Talk to your health care provider about which screenings and vaccines you need and how often you need them. This information is not intended to replace advice given to you by your health care provider. Make sure you discuss any questions you have with your health care provider. Document Released: 09/21/2015 Document Revised: 05/14/2016 Document Reviewed: 06/26/2015 Elsevier Interactive Patient Education  2017 [place] Prevention in the Home Falls can cause injuries. They can happen to people of all ages. There are many things you can do to make your home safe and to help prevent falls. What can I do on the outside of my home?  Regularly fix the edges of walkways and driveways and fix any cracks.  Remove anything that might make you trip as you walk through a door, such as a raised step or threshold.  Trim any bushes or trees on the path to your home.  Use bright outdoor lighting.  Clear any walking paths of anything that might make someone trip, such as rocks or tools.  Regularly check to see if handrails are loose or broken. Make sure that both sides of any steps have handrails.  Any raised decks and porches should have guardrails on the edges.  Have any leaves, snow, or ice cleared regularly.  Use sand or salt on walking paths during winter.  Clean up any spills in your garage right away. This includes oil or grease spills. What can I do in the bathroom?  Use night lights.  Install grab bars by the toilet and in the tub and shower. Do not use towel bars as grab bars.  Use non-skid mats or decals in the tub or shower.  If you need to sit down in the shower, use a plastic, non-slip stool.  Keep the floor dry. Clean up any  water that spills on the floor as soon as it happens.  Remove soap buildup in the tub or shower regularly.  Attach bath mats securely with double-sided non-slip rug tape.  Do not have throw rugs and other things on the floor that can make you trip. What can I do in the bedroom?  Use night lights.  Make sure that you have a light by your bed that is easy to reach.  Do not use any sheets or blankets that are too big for your bed. They should not hang down onto the floor.  Have a firm chair that has side arms. You can use this for support while you get dressed.  Do not have throw rugs and other things on the floor that can make you trip. What can I do in the kitchen?  Clean up any spills right away.  Avoid walking on wet floors.  Keep items that you use a lot in easy-to-reach places.  If you need to reach something above you, use a strong step stool that has a grab bar.  Keep electrical cords out of the way.  Do not use floor polish or wax that makes floors slippery. If you must use wax, use non-skid floor wax.  Do  not have throw rugs and other things on the floor that can make you trip. What can I do with my stairs?  Do not leave any items on the stairs.  Make sure that there are handrails on both sides of the stairs and use them. Fix handrails that are broken or loose. Make sure that handrails are as long as the stairways.  Check any carpeting to make sure that it is firmly attached to the stairs. Fix any carpet that is loose or worn.  Avoid having throw rugs at the top or bottom of the stairs. If you do have throw rugs, attach them to the floor with carpet tape.  Make sure that you have a light switch at the top of the stairs and the bottom of the stairs. If you do not have them, ask someone to add them for you. What else can I do to help prevent falls?  Wear shoes that:  Do not have high heels.  Have rubber bottoms.  Are comfortable and fit you well.  Are closed  at the toe. Do not wear sandals.  If you use a stepladder:  Make sure that it is fully opened. Do not climb a closed stepladder.  Make sure that both sides of the stepladder are locked into place.  Ask someone to hold it for you, if possible.  Clearly mark and make sure that you can see:  Any grab bars or handrails.  First and last steps.  Where the edge of each step is.  Use tools that help you move around (mobility aids) if they are needed. These include:  Canes.  Walkers.  Scooters.  Crutches.  Turn on the lights when you go into a dark area. Replace any light bulbs as soon as they burn out.  Set up your furniture so you have a clear path. Avoid moving your furniture around.  If any of your floors are uneven, fix them.  If there are any pets around you, be aware of where they are.  Review your medicines with your doctor. Some medicines can make you feel dizzy. This can increase your chance of falling. Ask your doctor what other things that you can do to help prevent falls. This information is not intended to replace advice given to you by your health care provider. Make sure you discuss any questions you have with your health care provider. Document Released: 06/21/2009 Document Revised: 01/31/2016 Document Reviewed: 09/29/2014 Elsevier Interactive Patient Education  2017 [employer].

## 2021-01-22 NOTE — Progress Notes (Signed)
Subjective:   Deanna Cobb is a 86 y.o. female who presents for Medicare Annual (Subsequent) preventive examination.  Review of Systems: N/A     I connected with the patient today by telephone and verified that I am speaking with the correct person using two identifiers. Location patient: home Location nurse: work Persons participating in the telephone visit: patient, nurse.   I discussed the limitations, risks, security and privacy concerns of performing an evaluation and management service by telephone and the availability of in person appointments. I also discussed with the patient that there may be a patient responsible charge related to this service. The patient expressed understanding and verbally consented to this telephonic visit.        Cardiac Risk Factors include: advanced age (>75men, >44 women);Other (see comment), Risk factor comments: hyperlipidemia     Objective:    Today's Vitals   There is no height or weight on file to calculate BMI.  Advanced Directives 01/22/2021 11/23/2018 10/06/2018 09/23/2017 04/22/2016  Does Patient Have a Medical Advance Directive? Yes Yes Yes Yes No  Type of Paramedic of [place];Living will [place];Living will [place];Living will [place];Living will -  Does patient want to make changes to medical advance directive? - No - Patient declined No - Patient declined No - Patient declined -  Copy of [place] in Chart? No - copy requested No - copy requested Yes - validated most recent copy scanned in chart (See row information) Yes -    Current Medications (verified) Outpatient Encounter Medications as of 01/22/2021  Medication Sig  . cholecalciferol (VITAMIN D) 1000 UNITS tablet Take 1,000 Units by mouth daily as needed.  [name] (INTRINSI B12-FOLATE) [id number] MCG-MCG-MG TABS Take 1 tablet by mouth daily.  .  Multiple Vitamins-Minerals (ICAPS AREDS 2 PO) Take by mouth.  . Multiple Vitamins-Minerals (MULTIVITAMIN ADULTS PO) Take by mouth. Emtero Vite  . NON FORMULARY in the morning and at bedtime. IBS clear supplement  . Omega-3 Fatty Acids (EPA) 1000 MG CAPS Take 1 capsule by mouth 3 (three) times daily.   . Probiotic Product (PROBIOTIC ADVANCED PO) Take by mouth.  . thyroid (ARMOUR THYROID) 15 MG tablet Take 1 tablet (15 mg total) by mouth daily.  . TURMERIC PO Take by mouth daily.   Facility-Administered Encounter Medications as of 01/22/2021  Medication  . denosumab (PROLIA) injection 60 mg    Allergies (verified) Clindamycin/lincomycin and Penicillins   History: Past Medical History:  Diagnosis Date  . Accidental fall from chair   . Allergy    multiple food allergies  . Asthma    as a child  . Colitis 2012  . Coronary atherosclerosis    D/T lipid rich plaque  . GERD (gastroesophageal reflux disease)   . H. pylori infection 2012  . Heart murmur   . Hyperlipidemia   . Hypothyroidism   . Insomnia   . Internal hemorrhoids   . Mitral valve prolapse   . Osteoporosis   . Sarcoidosis 1966  . TIA (transient ischemic attack) 04/2005  . Visit for suture removal    Past Surgical History:  Procedure Laterality Date  . ABDOMINAL HYSTERECTOMY  1972  . LYMPH NODE BIOPSY  1966  . TONSILLECTOMY     Family History  Problem Relation Age of Onset  . Throat cancer Mother   . Alcohol abuse Brother   . Heart disease Sister   . Heart  attack Maternal Grandmother   . Osteoporosis Neg Hx    Social History   Socioeconomic History  . Marital status: Married    Spouse name: Jenny Reichmann  . Number of children: 4  . Years of education: Nursing school  . Highest education level: Not on file  Occupational History  . Occupation: Retired-RN  Tobacco Use  . Smoking status: Never Smoker  . Smokeless tobacco: Never Used  Substance and Sexual Activity  . Alcohol use: Yes    Comment: on glass of wine  daily  . Drug use: No  . Sexual activity: Not Currently  Other Topics Concern  . Not on file  Social History Narrative   Does have a living will.   Would desire CPR but would not want prolonged life support.      05/21/20   From: [place]   Living: with husband, John (1960)   Work: retired Therapist, sports      Family: 4 children - [place], [place], [place] - 9 grandchildren - some in [place]      Enjoys: traveling abroad, walking daily, yoga, golf      Exercise: speed walk 1 hour daily, yoga   Diet: limited due to IBS - toast with almond butter for breakfast, meal prep launch (salad), cook twice weekly      Safety   Seat belts: Yes    Guns: Yes  and secure   Safe in relationships: Yes    Social Determinants of Health   Financial Resource Strain: Low Risk   . Difficulty of Paying Living Expenses: Not hard at all  Food Insecurity: No Food Insecurity  . Worried About Charity fundraiser in the Last Year: Never true  . Ran Out of Food in the Last Year: Never true  Transportation Needs: No Transportation Needs  . Lack of Transportation (Medical): No  . Lack of Transportation (Non-Medical): No  Physical Activity: Sufficiently Active  . Days of Exercise per Week: 7 days  . Minutes of Exercise per Session: 60 min  Stress: No Stress Concern Present  . Feeling of Stress : Not at all  Social Connections: Not on file    Tobacco Counseling Counseling given: Not Answered   Clinical Intake:  Pre-visit preparation completed: Yes  Pain : No/denies pain     Nutritional Risks: None Diabetes: No  How often do you need to have someone help you when you read instructions, pamphlets, or other written materials from your doctor or pharmacy?: 1 - Never  Diabetic: No Nutrition Risk Assessment:  Has the patient had any N/V/D within the last 2 months?  No  Does the patient have any non-healing wounds?  No  Has the patient had any unintentional weight loss or weight gain?  No    Diabetes:  Is the patient diabetic?  No  If diabetic, was a CBG obtained today?  N/A Did the patient bring in their glucometer from home?  N/A How often do you monitor your CBG's? N/A.   Financial Strains and Diabetes Management:  Are you having any financial strains with the device, your supplies or your medication? N/A.  Does the patient want to be seen by Chronic Care Management for management of their diabetes?  N/A Would the patient like to be referred to a Nutritionist or for Diabetic Management?  N/A  Interpreter Needed?: No  Information entered by :: CJohnson, LPN   Activities of Daily Living In your present state of health, do you  have any difficulty performing the following activities: 01/22/2021  Hearing? Y  Vision? N  Difficulty concentrating or making decisions? N  Walking or climbing stairs? N  Dressing or bathing? N  Doing errands, shopping? N  Preparing Food and eating ? N  Using the Toilet? N  In the past six months, have you accidently leaked urine? Y  Comment wears a pad at night time only  Do you have problems with loss of bowel control? N  Managing your Medications? N  Managing your Finances? N  Housekeeping or managing your Housekeeping? N  Some recent data might be hidden    Patient Care Team: Lesleigh Noe, MD as PCP - General (Family Medicine) Richmond Campbell, MD as Consulting Physician (Gastroenterology) Barrie Dunker, MD as Consulting Physician (Dermatology) Lafayette Dragon, MD (Inactive) (Gastroenterology) Minna Merritts, MD as Consulting Physician (Cardiology)  Indicate any recent Medical Services you may have received from other than [facility] providers in the past year (date may be approximate).     Assessment:   This is a routine wellness examination for Shazia.  Hearing/Vision screen  Hearing Screening   125Hz  250Hz  500Hz  1000Hz  2000Hz  3000Hz  4000Hz  6000Hz  8000Hz   Right ear:           Left ear:           Vision Screening  Comments: Patient gets annual eye exams   Dietary issues and exercise activities discussed: Current Exercise Habits: Home exercise routine;Structured exercise class, Type of exercise: walking;yoga, Time (Minutes): 60, Frequency (Times/Week): 7, Weekly Exercise (Minutes/Week): 420, Intensity: Moderate, Exercise limited by: None identified  Goals Addressed            This Visit's Progress   . Patient Stated       01/22/2021, I will continue to walk daily for 1 hour and yoga weekly.       Depression Screen PHQ 2/9 Scores 01/22/2021 05/21/2020 10/06/2018 09/23/2017 06/02/2016 05/30/2015 05/11/2014  PHQ - 2 Score 0 0 0 0 0 0 0  PHQ- 9 Score 0 - - - - - -    Fall Risk Fall Risk  01/22/2021 05/21/2020 04/27/2019 04/11/2019 10/06/2018  Falls in the past year? 0 0 0 (No Data) 0  Comment - - - Emmi Telephone Survey: data to providers prior to load -  Number falls in past yr: 0 0 - (No Data) -  Comment - - - Emmi Telephone Survey Actual Response =  -  Injury with Fall? 0 - - - -  Risk for fall due to : No Fall Risks - - - -  Follow up Falls evaluation completed;Falls prevention discussed - Falls evaluation completed - -    FALL RISK PREVENTION PERTAINING TO THE HOME:  Any stairs in or around the home? Yes  If so, are there any without handrails? No  Home free of loose throw rugs in walkways, pet beds, electrical cords, etc? Yes  Adequate lighting in your home to reduce risk of falls? Yes   ASSISTIVE DEVICES UTILIZED TO PREVENT FALLS:  Life alert? No  Use of a cane, walker or w/c? No  Grab bars in the bathroom? No  Shower chair or bench in shower? No  Elevated toilet seat or a handicapped toilet? No   TIMED UP AND GO:  Was the test performed? No . , telephone visit   Cognitive Function: MMSE - Mini Mental State Exam 01/22/2021 09/23/2017  Orientation to time 5 5  Orientation to Place 5 5  Registration 3 3  Attention/ Calculation 5 5  Recall 3 2  Language- name 2 objects - 2  Language-  repeat 1 1  Language- follow 3 step command - 3  Language- read & follow direction - 1  Write a sentence - 1  Copy design - 1  Total score - 29  Mini Cog  Mini-Cog screen was completed. Maximum score is 22. A value of 0 denotes this part of the MMSE was not completed or the patient failed this part of the Mini-Cog screening.       Immunizations Immunization History  Administered Date(s) Administered  . Fluad Quad(high Dose 65+) 05/21/2020  . Influenza Split 06/11/2011, 06/15/2012  . Influenza, High Dose Seasonal PF 07/04/2017, 06/26/2018  . Influenza,inj,Quad PF,6+ Mos 05/11/2014, 06/02/2016  . PFIZER(Purple Top)SARS-COV-2 Vaccination 09/29/2019, 10/20/2019, 12/13/2020  . Pneumococcal Conjugate-13 05/11/2014  . Pneumococcal Polysaccharide-23 03/21/2009  . Td 03/29/2009  . Zoster 03/16/2008  . Zoster Recombinat (Shingrix) 05/23/2019, 06/03/2019, 07/14/2019    TDAP status: Due, Education has been provided regarding the importance of this vaccine. Advised may receive this vaccine at local pharmacy or Health Dept. Aware to provide a copy of the vaccination record if obtained from local pharmacy or Health Dept. Verbalized acceptance and understanding.  Flu Vaccine status: Up to date  Pneumococcal vaccine status: Up to date  Covid-19 vaccine status: Completed vaccines  Qualifies for Shingles Vaccine? Yes   Zostavax completed Yes   Shingrix Completed?: Yes  Screening Tests Health Maintenance  Topic Date Due  . TETANUS/TDAP  01/23/2024 (Originally 03/30/2019)  . COVID-19 Vaccine (4 - Booster for Pfizer series) 03/14/2021  . INFLUENZA VACCINE  04/08/2021  . DEXA SCAN  Completed  . PNA vac Low Risk Adult  Completed  . HPV VACCINES  Aged Out    Health Maintenance  There are no preventive care reminders to display for this patient.  Colorectal cancer screening: No longer required.   Mammogram status: Completed 08/22/2020. Repeat every year  Bone Density status: Completed  06/02/2019. Results reflect: Bone density results: OSTEOPENIA. Repeat every 2 years.  Lung Cancer Screening: (Low Dose CT Chest recommended if Age 68-80 years, 30 pack-year currently smoking OR have quit w/in 15 years.) does not qualify.    Additional Screening:  Hepatitis C Screening: does not qualify; Completed N/A  Vision Screening: Recommended annual ophthalmology exams for early detection of glaucoma and other disorders of the eye. Is the patient up to date with their annual eye exam?  Yes  Who is the provider or what is the name of the office in which the patient attends annual eye exams? [facility] If pt is not established with a provider, would they like to be referred to a provider to establish care? No .   Dental Screening: Recommended annual dental exams for proper oral hygiene  Community Resource Referral / Chronic Care Management: CRR required this visit?  No   CCM required this visit?  No      Plan:     I have personally reviewed and noted the following in the patient's chart:   . Medical and social history . Use of alcohol, tobacco or illicit drugs  . Current medications and supplements including opioid prescriptions.  . Functional ability and status . Nutritional status . Physical activity . Advanced directives . List of other physicians . Hospitalizations, surgeries, and ER visits in previous 12 months . Vitals . Screenings to include cognitive, depression, and falls . Referrals and appointments  In  addition, I have reviewed and discussed with patient certain preventive protocols, quality metrics, and best practice recommendations. A written personalized care plan for preventive services as well as general preventive health recommendations were provided to patient.   Due to this being a telephonic visit, the after visit summary with patients personalized plan was offered to patient via office or my-chart. Patient preferred to pick up at office at  next visit or via mychart.   Andrez Grime, LPN   [id number]

## 2021-01-22 NOTE — Progress Notes (Signed)
PCP notes:  Health Maintenance: tdap- insurance    Abnormal Screenings: none   Patient concerns: none   Nurse concerns: none   Next PCP appt.: 01/24/2021 @ 10 am

## 2021-01-24 ENCOUNTER — Other Ambulatory Visit: Payer: Self-pay

## 2021-01-24 ENCOUNTER — Ambulatory Visit (INDEPENDENT_AMBULATORY_CARE_PROVIDER_SITE_OTHER): Payer: Medicare Other | Admitting: Family Medicine

## 2021-01-24 VITALS — BP 140/70 | HR 70 | Temp 97.4°F | Ht 62.0 in | Wt 90.5 lb

## 2021-01-24 DIAGNOSIS — E039 Hypothyroidism, unspecified: Secondary | ICD-10-CM

## 2021-01-24 DIAGNOSIS — E782 Mixed hyperlipidemia: Secondary | ICD-10-CM

## 2021-01-24 DIAGNOSIS — I739 Peripheral vascular disease, unspecified: Secondary | ICD-10-CM

## 2021-01-24 DIAGNOSIS — K589 Irritable bowel syndrome without diarrhea: Secondary | ICD-10-CM | POA: Diagnosis not present

## 2021-01-24 DIAGNOSIS — M810 Age-related osteoporosis without current pathological fracture: Secondary | ICD-10-CM

## 2021-01-24 LAB — COMPREHENSIVE METABOLIC PANEL
ALT: 15 U/L (ref 0–35)
AST: 19 U/L (ref 0–37)
Albumin: 4.4 g/dL (ref 3.5–5.2)
Alkaline Phosphatase: 77 U/L (ref 39–117)
BUN: 17 mg/dL (ref 6–23)
CO2: 33 mEq/L — ABNORMAL HIGH (ref 19–32)
Calcium: 9.7 mg/dL (ref 8.4–10.5)
Chloride: 101 mEq/L (ref 96–112)
Creatinine, Ser: 0.72 mg/dL (ref 0.40–1.20)
GFR: 76.85 mL/min (ref >60.00)
Glucose, Bld: 99 mg/dL (ref 70–99)
Potassium: 3.9 mEq/L (ref 3.5–5.1)
Sodium: 141 mEq/L (ref 135–145)
Total Bilirubin: 0.7 mg/dL (ref 0.2–1.2)
Total Protein: 6.4 g/dL (ref 6.0–8.3)

## 2021-01-24 LAB — LIPID PANEL
Cholesterol: 221 mg/dL — ABNORMAL HIGH (ref 0–200)
HDL: 72.2 mg/dL (ref >39.00)
LDL Cholesterol: 126 mg/dL — ABNORMAL HIGH (ref 0–99)
NonHDL: 148.38
Total CHOL/HDL Ratio: 3
Triglycerides: 112 mg/dL (ref 0.0–149.0)
VLDL: 22.4 mg/dL (ref 0.0–40.0)

## 2021-01-24 LAB — TSH: TSH: 2.11 u[IU]/mL (ref 0.35–4.50)

## 2021-01-24 LAB — T4, FREE: Free T4: 0.75 ng/dL (ref 0.60–1.60)

## 2021-01-24 MED ORDER — DICYCLOMINE HCL 10 MG PO CAPS: 10.0000 mg | ORAL_CAPSULE | Freq: Three times a day (TID) | ORAL | 1 refills | Status: AC | PRN

## 2021-01-24 MED ORDER — THYROID 15 MG PO TABS
15.0000 mg | ORAL_TABLET | Freq: Every day | ORAL | 3 refills | Status: DC
Start: 2021-01-24 — End: 2022-09-15

## 2021-01-24 NOTE — Patient Instructions (Addendum)
Return to [facility] to look into getting your Shingles shot #2, you can also look into getting TDAP depending.   Repeat Dexa scan and mammogram in December  If worse or not improved can plan for you see Dr. Truddie Coco

## 2021-01-24 NOTE — Assessment & Plan Note (Signed)
Stable and persistent. Some improvement with Bentyl prn. Will refill. Dicyclomine 10 mg tid prn

## 2021-01-24 NOTE — Assessment & Plan Note (Signed)
>  1 year since last prolia dose. Pt met with endocrinology and elected to not pursue treatment and focus on weight bearing and vitamins. Will repeat DEXA to evaluate after 2 years since last prolia and if worse anticipate referral to Dr. Truddie Coco with [facility] per preference.

## 2021-01-24 NOTE — Assessment & Plan Note (Signed)
Previously saw Dr. Rockey Situ and would like to check back with him - referral placed. Does not want a statin medication based on research though discussed she has known build-up and may benefit.

## 2021-01-24 NOTE — Progress Notes (Signed)
Subjective:     Deanna Cobb is a 86 y.o. female presenting for Medicare Wellness     HPI   # Thyroid - doing well - continuing medication - always cold  # IBS - has seen GI - ran out of benyl - needs a refill, this helps with symptoms - still having issue - has mixed symptoms - will be occasionally   #Osteoporosis - stopped all medication - did a lot of research  - doing vitamin D and gets calcium from diet - doing weight bearing exercise    Review of Systems   Social History   Tobacco Use  Smoking Status Never Smoker  Smokeless Tobacco Never Used        Objective:    BP Readings from Last 3 Encounters:  01/24/21 140/70  07/27/20 128/78  05/21/20 136/70   Wt Readings from Last 3 Encounters:  01/24/21 90 lb 8 oz (41.1 kg)  07/27/20 89 lb 3.2 oz (40.5 kg)  05/21/20 90 lb 8 oz (41.1 kg)    BP 140/70   Pulse 70   Temp (!) 97.4 F (36.3 C) (Temporal)   Ht _0  (1.575 m)   Wt 90 lb 8 oz (41.1 kg)   SpO2 98%   BMI 16.55 kg/m    Physical Exam Constitutional:      General: She is not in acute distress.    Appearance: She is well-developed. She is not diaphoretic.  HENT:     Right Ear: External ear normal.     Left Ear: External ear normal.     Nose: Nose normal.  Eyes:     Conjunctiva/sclera: Conjunctivae normal.  Cardiovascular:     Rate and Rhythm: Normal rate and regular rhythm.     Heart sounds: No murmur heard.   Pulmonary:     Effort: Pulmonary effort is normal. No respiratory distress.     Breath sounds: Normal breath sounds. No wheezing.  Musculoskeletal:     Cervical back: Neck supple.  Skin:    General: Skin is warm and dry.     Capillary Refill: Capillary refill takes less than 2 seconds.  Neurological:     Mental Status: She is alert. Mental status is at baseline.  Psychiatric:        Mood and Affect: Mood normal.        Behavior: Behavior normal.           Assessment & Plan:   Problem List Items  Addressed This Visit      Cardiovascular and Mediastinum   PAD (peripheral artery disease) ([place])   Relevant Orders   Ambulatory referral to Cardiology   Comprehensive metabolic panel     Digestive   IBS (irritable bowel syndrome) - Primary    Stable and persistent. Some improvement with Bentyl prn. Will refill. Dicyclomine 10 mg tid prn      Relevant Medications   dicyclomine (BENTYL) 10 MG capsule     Endocrine   Hypothyroidism    Stable. Check tsh today. Refill armour thyroid 15 mg daily      Relevant Medications   thyroid (ARMOUR THYROID) 15 MG tablet   Other Relevant Orders   TSH   T3   T4, free     Musculoskeletal and Integument   Osteoporosis    >1 year since last prolia dose. Pt met with endocrinology and elected to not pursue treatment and focus on weight bearing and vitamins. Will repeat DEXA to evaluate after 2  years since last prolia and if worse anticipate referral to Dr. Truddie Coco with [facility] per preference.       Relevant Medications   Menaquinone-7 (VITAMIN K2 PO)   Other Relevant Orders   Comprehensive metabolic panel   DG Bone Density     Other   HYPERLIPIDEMIA TYPE IIB / III    Previously saw Dr. Rockey Situ and would like to check back with him - referral placed. Does not want a statin medication based on research though discussed she has known build-up and may benefit.       Relevant Orders   Ambulatory referral to Cardiology   Comprehensive metabolic panel   Lipid panel       Return in about 1 year (around 01/24/2022).  Lesleigh Noe, MD  This visit occurred during the SARS-CoV-2 public health emergency.  Safety protocols were in place, including screening questions prior to the visit, additional usage of staff PPE, and extensive cleaning of exam room while observing appropriate contact time as indicated for disinfecting solutions.

## 2021-01-24 NOTE — Assessment & Plan Note (Signed)
Stable. Check tsh today. Refill armour thyroid 15 mg daily

## 2021-01-28 ENCOUNTER — Telehealth: Payer: Self-pay

## 2021-01-28 NOTE — Telephone Encounter (Signed)
Received a PA from [facility] for the following Rx: Dicyclomine HCl 10mg   PA submitted through cover my meds

## 2021-01-29 NOTE — Telephone Encounter (Signed)
PA approved by [employer].  Coverage from 09/08/20-09/07/21

## 2021-02-18 ENCOUNTER — Other Ambulatory Visit: Payer: Self-pay | Admitting: Family Medicine

## 2021-02-18 DIAGNOSIS — Z1231 Encounter for screening mammogram for malignant neoplasm of breast: Secondary | ICD-10-CM

## 2021-02-21 ENCOUNTER — Other Ambulatory Visit: Payer: Self-pay | Admitting: Family Medicine

## 2021-02-21 DIAGNOSIS — K589 Irritable bowel syndrome without diarrhea: Secondary | ICD-10-CM

## 2021-03-20 ENCOUNTER — Encounter: Payer: Self-pay | Admitting: Cardiovascular Disease

## 2021-03-20 ENCOUNTER — Ambulatory Visit (INDEPENDENT_AMBULATORY_CARE_PROVIDER_SITE_OTHER): Payer: Medicare Other | Admitting: Cardiovascular Disease

## 2021-03-20 ENCOUNTER — Other Ambulatory Visit: Payer: Self-pay

## 2021-03-20 VITALS — BP 130/60 | HR 69 | Ht 62.5 in | Wt 89.2 lb

## 2021-03-20 DIAGNOSIS — I6523 Occlusion and stenosis of bilateral carotid arteries: Secondary | ICD-10-CM | POA: Diagnosis not present

## 2021-03-20 DIAGNOSIS — E782 Mixed hyperlipidemia: Secondary | ICD-10-CM

## 2021-03-20 DIAGNOSIS — I341 Nonrheumatic mitral (valve) prolapse: Secondary | ICD-10-CM

## 2021-03-20 DIAGNOSIS — I739 Peripheral vascular disease, unspecified: Secondary | ICD-10-CM

## 2021-03-20 NOTE — Patient Instructions (Addendum)
Call if you would like the: Carotid screen: $50  Medication Instructions:  No changes  If you need a refill on your cardiac medications before your next appointment, please call your pharmacy.   Lab work: No new labs needed  Testing/Procedures: No new test at this time  Follow-Up: At [facility], you and your health needs are our priority.  As part of our continuing mission to provide you with exceptional heart care, we have created designated Provider Care Teams.  These Care Teams include your primary Cardiologist (physician) and Advanced Practice Providers (APPs -  Physician Assistants and Nurse Practitioners) who all work together to provide you with the care you need, when you need it.  You will need a follow up appointment as needed  Providers on your designated Care Team:   Murray Hodgkins, NP Christell Faith, PA-C Marrianne Mood, PA-C Cadence Forsan, [place]   COVID-19 Vaccine Information can be found at: [email or web address] For questions related to vaccine distribution or appointments, please email vaccine@[email or web address] .[email or web address] or call [phone].

## 2021-03-20 NOTE — Progress Notes (Signed)
Cardiology Office Note  Date:  03/20/2021   ID:  Deanna Cobb, DOB Jan 16, 1935, MRN [id number]  PCP:  Lesleigh Noe, MD   Chief Complaint  Patient presents with   New Patient (Initial Visit)    Ref by Dr. Einar Pheasant for evaluation of PAD & hyperlipidemia. Medications reviewed by the patient verbally. "Doing well." Medications reviewed by the patient verbally.     HPI:  Ms. Duplessis is a pleasant 86 year old woman with history of  TIA in 2006,  workup at that time showing mild atherosclerosis of the carotids bilaterally,  echocardiogram in 2006 suggesting borderline mitral valve prolapse, trace MR, normal ejection fraction IBS GERD who presents for new patient visit for known carotid disease and hyperlipidemia  Last seen in clinic March 2019 General reports that she is doing well, very active, walks daily, does yoga Denies any anginal symptoms  GERD symptoms Bouts with IBS  Weight down 3 pounds from 2019 Eating very healthy  Reviewed prior testing including carotid ultrasound showing minimal if any plaque  Reviewed prior CT scan 2012 and again with minimal atherosclerotic plaque  Prefers not to be on medications Total cholesterol 220  EKG personally reviewed by myself on todays visit Shows normal sinus rhythm rate 64 bpm no significant ST or T wave changes  Other past medical history reviewed Carotid ultrasound done November 2016 showing no significant stenosis, only intimal thickening  CT scan abdomen from 2012  one spot with minimal CA athero, ostial left common iliac artery otherwise no significant disease  PMH:   has a past medical history of Accidental fall from chair, Allergy, Asthma, Colitis (2012), Coronary atherosclerosis, GERD (gastroesophageal reflux disease), H. pylori infection (2012), Heart murmur, Hyperlipidemia, Hypothyroidism, Insomnia, Internal hemorrhoids, Mitral valve prolapse, Osteoporosis, Sarcoidosis (1966), TIA (transient ischemic attack) (04/2005), and  Visit for suture removal.  PSH:    Past Surgical History:  Procedure Laterality Date   ABDOMINAL HYSTERECTOMY  1972   LYMPH NODE BIOPSY  1966   TONSILLECTOMY      Current Outpatient Medications  Medication Sig Dispense Refill   cholecalciferol (VITAMIN D) 1000 UNITS tablet Take 1,000 Units by mouth daily as needed.     dicyclomine (BENTYL) 10 MG capsule Take 1 capsule (10 mg total) by mouth 3 (three) times daily as needed for spasms (IBS). 90 capsule 1   Folate-B12-Intrinsic Factor (INTRINSI B12-FOLATE) 557-322-02 MCG-MCG-MG TABS Take 1 tablet by mouth daily.     MAGNESIUM PO Take 1 capsule by mouth at bedtime.     Menaquinone-7 (VITAMIN K2 PO) Take 1 capsule by mouth in the morning.     Multiple Vitamins-Minerals (PRESERVISION AREDS 2) CAPS Take 1 tablet by mouth 2 (two) times daily.     Omega-3 Fatty Acids (EPA) 1000 MG CAPS Take 1 capsule by mouth 3 (three) times daily.      Probiotic Product (PROBIOTIC ADVANCED PO) Take by mouth.     thyroid (ARMOUR THYROID) 15 MG tablet Take 1 tablet (15 mg total) by mouth daily. 90 tablet 3   No current facility-administered medications for this visit.     Allergies:   Clindamycin, Penicillin g, Clindamycin/lincomycin, and Penicillins   Social History:  The patient  reports that she has never smoked. She has never used smokeless tobacco. She reports current alcohol use. She reports that she does not use drugs.   Family History:   family history includes Alcohol abuse in her brother; Heart attack in her maternal grandmother; Heart disease in her sister; Throat  cancer in her mother.    Review of Systems: Review of Systems  Constitutional: Negative.   HENT: Negative.    Respiratory: Negative.    Cardiovascular: Negative.   Gastrointestinal: Negative.   Musculoskeletal: Negative.   Neurological: Negative.   Psychiatric/Behavioral: Negative.    All other systems reviewed and are negative.   PHYSICAL EXAM: VS:  BP 130/60 (BP Location:  Right Arm, Patient Position: Sitting, Cuff Size: Normal)   Pulse 69   Ht 5' 2.5" (1.588 m)   Wt 89 lb 4 oz (40.5 kg)   SpO2 98%   BMI 16.06 kg/m  , BMI Body mass index is 16.06 kg/m. Constitutional:  oriented to person, place, and time. No distress.  HENT:  Head: Grossly normal Eyes:  no discharge. No scleral icterus.  Neck: No JVD, no carotid bruits  Cardiovascular: Regular rate and rhythm, no murmurs appreciated Pulmonary/Chest: Clear to auscultation bilaterally, no wheezes or rails Abdominal: Soft.  no distension.  no tenderness.  Musculoskeletal: Normal range of motion Neurological:  normal muscle tone. Coordination normal. No atrophy Skin: Skin warm and dry Psychiatric: normal affect, pleasant  Recent Labs: 01/24/2021: ALT 15; BUN 17; Creatinine, Ser 0.72; Potassium 3.9; Sodium 141; TSH 2.11    Lipid Panel Lab Results  Component Value Date   CHOL 221 (H) 01/24/2021   HDL 72.20 01/24/2021   LDLCALC 126 (H) 01/24/2021   TRIG 112.0 01/24/2021      Wt Readings from Last 3 Encounters:  03/20/21 89 lb 4 oz (40.5 kg)  01/24/21 90 lb 8 oz (41.1 kg)  07/27/20 89 lb 3.2 oz (40.5 kg)       ASSESSMENT AND PLAN:  HYPERLIPIDEMIA TYPE IIB / III Cholesterol mildly elevated  Long discussion whether to treat or not Carotid ultrasound several years ago and prior CT scan 10 years ago no significant disease She prefers no medications at this time Very healthy otherwise Discussed other screening studies such as repeating CT coronary calcium scoring or carotid ultrasound, she will call us if she would like these done Very low risk of having disease progression   Atherosclerosis of both carotid arteries Intimal thickening, Repeat scan offered, not interested in statin, she would like [place] if she would like more work-up  GERD Stable    Total encounter time more than 45 minutes  Greater than 50% was spent in counseling and coordination of care with the patient     No  orders of the defined types were placed in this encounter.    Signed, Esmond Plants, M.D., Ph.D. 03/20/2021  [place], [place]

## 2021-03-20 NOTE — Addendum Note (Signed)
Addended by: Wynema Birch on: 03/20/2021 10:42 AM   Modules accepted: Orders

## 2021-04-04 DIAGNOSIS — D2261 Melanocytic nevi of right upper limb, including shoulder: Secondary | ICD-10-CM | POA: Diagnosis not present

## 2021-04-04 DIAGNOSIS — L570 Actinic keratosis: Secondary | ICD-10-CM | POA: Diagnosis not present

## 2021-04-04 DIAGNOSIS — Z85828 Personal history of other malignant neoplasm of skin: Secondary | ICD-10-CM | POA: Diagnosis not present

## 2021-04-04 DIAGNOSIS — D2272 Melanocytic nevi of left lower limb, including hip: Secondary | ICD-10-CM | POA: Diagnosis not present

## 2021-04-04 DIAGNOSIS — D2262 Melanocytic nevi of left upper limb, including shoulder: Secondary | ICD-10-CM | POA: Diagnosis not present

## 2021-04-04 DIAGNOSIS — X32XXXA Exposure to sunlight, initial encounter: Secondary | ICD-10-CM | POA: Diagnosis not present

## 2021-04-04 DIAGNOSIS — L720 Epidermal cyst: Secondary | ICD-10-CM | POA: Diagnosis not present

## 2021-05-27 DIAGNOSIS — Z23 Encounter for immunization: Secondary | ICD-10-CM | POA: Diagnosis not present

## 2021-06-03 DIAGNOSIS — M79642 Pain in left hand: Secondary | ICD-10-CM | POA: Diagnosis not present

## 2021-06-03 DIAGNOSIS — M13841 Other specified arthritis, right hand: Secondary | ICD-10-CM | POA: Diagnosis not present

## 2021-06-03 DIAGNOSIS — M13831 Other specified arthritis, right wrist: Secondary | ICD-10-CM | POA: Diagnosis not present

## 2021-06-21 DIAGNOSIS — E782 Mixed hyperlipidemia: Secondary | ICD-10-CM | POA: Diagnosis not present

## 2021-06-21 DIAGNOSIS — E039 Hypothyroidism, unspecified: Secondary | ICD-10-CM | POA: Diagnosis not present

## 2021-06-21 DIAGNOSIS — M810 Age-related osteoporosis without current pathological fracture: Secondary | ICD-10-CM | POA: Diagnosis not present

## 2021-07-02 DIAGNOSIS — Z23 Encounter for immunization: Secondary | ICD-10-CM | POA: Diagnosis not present

## 2021-08-03 ENCOUNTER — Ambulatory Visit
Admission: EM | Admit: 2021-08-03 | Discharge: 2021-08-03 | Disposition: A | Discharge status: Home / Self Care | Payer: Medicare Other | Attending: Physician Assistant | Admitting: Physician Assistant

## 2021-08-03 ENCOUNTER — Other Ambulatory Visit: Payer: Self-pay

## 2021-08-03 ENCOUNTER — Encounter: Payer: Self-pay | Admitting: Licensed Clinical Social Worker

## 2021-08-03 DIAGNOSIS — U071 COVID-19: Secondary | ICD-10-CM | POA: Diagnosis not present

## 2021-08-03 DIAGNOSIS — R5383 Other fatigue: Secondary | ICD-10-CM | POA: Diagnosis not present

## 2021-08-03 DIAGNOSIS — R051 Acute cough: Secondary | ICD-10-CM

## 2021-08-03 MED ORDER — MOLNUPIRAVIR 200 MG PO CAPS: 4.0000 | ORAL_CAPSULE | Freq: Two times a day (BID) | ORAL | 0 refills | Status: AC

## 2021-08-03 NOTE — ED Provider Notes (Signed)
[facility]    CSN: [id number] Arrival date & time: 08/03/21  1141      History   Chief Complaint Chief Complaint  Patient presents with   Covid Positive    HPI Deanna Cobb is a 86 y.o. female presenting for 5-day history of COVID symptoms.  Patient complains of low-grade temperatures up to 100.5, fatigue, body aches, cough and diarrhea.  Patient did have a positive at home COVID test 2 days ago.  She has been taking Tylenol but no other medication.  She denies any breathing difficulty or vomiting.  Patient reports feeling worse the past few days and not better.  The patient does have a history of sarcoidosis, hyperlipidemia, hypothyroidism.  Previous history of TIA.  No other complaints.  HPI  Past Medical History:  Diagnosis Date   Accidental fall from chair    Allergy    multiple food allergies   Asthma    as a child   Colitis 2012   Coronary atherosclerosis    D/T lipid rich plaque   GERD (gastroesophageal reflux disease)    H. pylori infection 2012   Heart murmur    Hyperlipidemia    Hypothyroidism    Insomnia    Internal hemorrhoids    Mitral valve prolapse    Osteoporosis    Sarcoidosis 1966   TIA (transient ischemic attack) 04/2005   Visit for suture removal     Patient Active Problem List   Diagnosis Date Noted   Urinary incontinence in female 05/21/2020   Vitamin D deficiency 10/14/2018   GERD (gastroesophageal reflux disease) 11/17/2017   PAD (peripheral artery disease) ([place]) 11/15/2017   Hearing loss in left ear 06/02/2016   Squamous cell carcinoma, leg 05/30/2015   IBS (irritable bowel syndrome) 07/27/2014   Carotid atherosclerosis 06/29/2014   Insomnia    Osteoporosis 07/03/2011   History of TIAs 06/02/2011   Hypothyroidism 03/18/2010   GLUTEN ENTEROPATHY 03/18/2010   HYPERLIPIDEMIA TYPE IIB / III 02/13/2009   Mitral valve prolapse 02/02/2009    Past Surgical History:  Procedure Laterality Date   ABDOMINAL HYSTERECTOMY   1972   [place]      OB History   No obstetric history on file.      Home Medications    Prior to Admission medications   Medication Sig Start Date End Date Taking? Authorizing Provider  molnupiravir EUA (LAGEVRIO) 200 MG CAPS capsule Take 4 capsules (800 mg total) by mouth 2 (two) times daily for 5 days. 08/03/21 08/08/21 Yes Danton Clap, PA-C  Multiple Vitamins-Minerals (PRESERVISION AREDS 2) CAPS Take 1 tablet by mouth 2 (two) times daily.   Yes [provider]  Probiotic Product (PROBIOTIC ADVANCED PO) Take by mouth.   Yes [provider]  thyroid (ARMOUR THYROID) 15 MG tablet Take 1 tablet (15 mg total) by mouth daily. 01/24/21  Yes Lesleigh Noe, MD  cholecalciferol (VITAMIN D) 1000 UNITS tablet Take 1,000 Units by mouth daily as needed.    [provider]  dicyclomine (BENTYL) 10 MG capsule Take 1 capsule (10 mg total) by mouth 3 (three) times daily as needed for spasms (IBS). 01/24/21   Lesleigh Noe, MD  Folate-B12-Intrinsic Factor (INTRINSI B12-FOLATE) [id number] MCG-MCG-MG TABS Take 1 tablet by mouth daily.    [provider]  MAGNESIUM PO Take 1 capsule by mouth at bedtime.    [provider]  Menaquinone-7 (VITAMIN K2 PO) Take 1 capsule by  mouth in the morning.    [provider]  Omega-3 Fatty Acids (EPA) 1000 MG CAPS Take 1 capsule by mouth 3 (three) times daily.     [provider]    Family History Family History  Problem Relation Age of Onset   Throat cancer Mother    Heart disease Sister    Alcohol abuse Brother    Heart attack Maternal Grandmother    Osteoporosis Neg Hx     Social History Social History   Tobacco Use   Smoking status: Never   Smokeless tobacco: Never  Vaping Use   Vaping Use: Never used  Substance Use Topics   Alcohol use: Yes    Comment: on glass of wine daily   Drug use: No     Allergies   Clindamycin, Penicillin g,  Clindamycin/lincomycin, and Penicillins   Review of Systems Review of Systems  Constitutional:  Positive for fatigue and fever. Negative for chills and diaphoresis.  HENT:  Negative for congestion, ear pain, rhinorrhea, sinus pressure, sinus pain and sore throat.   Respiratory:  Positive for cough. Negative for shortness of breath.   Gastrointestinal:  Positive for diarrhea. Negative for abdominal pain, nausea and vomiting.  Musculoskeletal:  Positive for myalgias. Negative for arthralgias.  Skin:  Negative for rash.  Neurological:  Negative for weakness and headaches.  Hematological:  Negative for adenopathy.    Physical Exam Triage Vital Signs ED Triage Vitals  Enc Vitals Group     BP 08/03/21 1241 136/70     Pulse Rate 08/03/21 1241 69     Resp 08/03/21 1241 16     Temp 08/03/21 1241 98.3 F (36.8 C)     Temp Source 08/03/21 1241 Oral     SpO2 08/03/21 1241 100 %     Weight 08/03/21 1239 90 lb (40.8 kg)     Height 08/03/21 1239 5\' 2"  (1.575 m)     Head Circumference --      Peak Flow --      Pain Score 08/03/21 1238 3     Pain Loc --      Pain Edu? --      Excl. in [place]? --    No data found.  Updated Vital Signs BP 136/70 (BP Location: Left Arm)   Pulse 69   Temp 98.3 F (36.8 C) (Oral)   Resp 16   Ht 5\' 2"  (1.575 m)   Wt 90 lb (40.8 kg)   SpO2 100%   BMI 16.46 kg/m       Physical Exam Vitals and nursing note reviewed.  Constitutional:      General: She is not in acute distress.    Appearance: Normal appearance. She is ill-appearing (mildly). She is not toxic-appearing.  HENT:     Head: Normocephalic and atraumatic.     Nose: Nose normal.     Mouth/Throat:     Mouth: Mucous membranes are moist.     Pharynx: Oropharynx is clear. No posterior oropharyngeal erythema.  Eyes:     General: No scleral icterus.       Right eye: No discharge.        Left eye: No discharge.     Conjunctiva/sclera: Conjunctivae normal.  Cardiovascular:     Rate and Rhythm:  Normal rate and regular rhythm.     Heart sounds: Normal heart sounds.  Pulmonary:     Effort: Pulmonary effort is normal. No respiratory distress.     Breath sounds: Normal breath  sounds.  Musculoskeletal:     Cervical back: Neck supple.  Skin:    General: Skin is dry.  Neurological:     General: No focal deficit present.     Mental Status: She is alert. Mental status is at baseline.     Motor: No weakness.     Gait: Gait normal.  Psychiatric:        Mood and Affect: Mood normal.        Behavior: Behavior normal.        Thought Content: Thought content normal.     UC Treatments / Results  Labs (all labs ordered are listed, but only abnormal results are displayed) Labs Reviewed - No data to display  EKG   Radiology No results found.  Procedures Procedures (including critical care time)  Medications Ordered in UC Medications - No data to display  Initial Impression / Assessment and Plan / UC Course  I have reviewed the triage vital signs and the nursing notes.  Pertinent labs & imaging results that were available during my care of the patient were reviewed by me and considered in my medical decision making (see chart for details).  86 year old female presenting today for positive COVID test 2 days ago.  She is hoping to have antiviral medication.  She is mildly ill-appearing but nontoxic.  Exam is normal today.  Chest clear to auscultation heart regular rate and rhythm.  Advised patient we do not have to retest her.  Can go ahead and treat her but she would need to start antiviral medication by the end of the day.  Patient agrees.  Sent Molnupiravir to pharmacy.  Supportive care encouraged with increasing rest and fluids, Tylenol, Robitussin for cough as needed.  Thoroughly reviewed return and ED precautions related to COVID-19.  Final Clinical Impressions(s) / UC Diagnoses   Final diagnoses:  COVID-19  Acute cough  Other fatigue     Discharge Instructions       Isolate the rest of the day today and then you may go back out into the public but you need to wear a mask for 5 more days.  I have sent antiviral medication for COVID to the pharmacy for you.  You have started by tonight for to be of any potential use.  If symptomatic, go home and rest. Push fluids. Take Tylenol as needed for discomfort. Gargle warm salt water. Throat lozenges. Take Mucinex DM or Robitussin for cough. Humidifier in bedroom to ease coughing. Warm showers. Also review the COVID handout for more information.  COVID-19 INFECTION: The incubation period of COVID-19 is approximately 14 days after exposure, with most symptoms developing in roughly 4-5 days. Symptoms may range in severity from mild to critically severe. Roughly 80% of those infected will have mild symptoms. People of any age may become infected with COVID-19 and have the ability to transmit the virus. The most common symptoms include: fever, fatigue, cough, body aches, headaches, sore throat, nasal congestion, shortness of breath, nausea, vomiting, diarrhea, changes in smell and/or taste.    COURSE OF ILLNESS Some patients may begin with mild disease which can progress quickly into critical symptoms. If your symptoms are worsening please call ahead to the Emergency Department and proceed there for further treatment. Recovery time appears to be roughly 1-2 weeks for mild symptoms and 3-6 weeks for severe disease.   GO IMMEDIATELY TO ER FOR FEVER YOU ARE UNABLE TO GET DOWN WITH TYLENOL, BREATHING PROBLEMS, CHEST PAIN, FATIGUE, LETHARGY, INABILITY TO EAT  OR DRINK, ETC  QUARANTINE AND ISOLATION: To help decrease the spread of COVID-19 please remain isolated if you have COVID infection or are highly suspected to have COVID infection. This means -stay home and isolate to one room in the home if you live with others. Do not share a bed or bathroom with others while ill, sanitize and wipe down all countertops and keep common areas clean  and disinfected. Stay home for 5 days. If you have no symptoms or your symptoms are resolving after 5 days, you can leave your house. Continue to wear a mask around others for 5 additional days. If you have been in close contact (within 6 feet) of someone diagnosed with COVID 19, you are advised to quarantine in your home for 14 days as symptoms can develop anywhere from 2-14 days after exposure to the virus. If you develop symptoms, you  must isolate.  Most current guidelines for COVID after exposure -unvaccinated: isolate 5 days and strict mask use x 5 days. Test on day 5 is possible -vaccinated: wear mask x 10 days if symptoms do not develop -You do not necessarily need to be tested for COVID if you have + exposure and  develop symptoms. Just isolate at home x10 days from symptom onset During this global pandemic, CDC advises to practice social distancing, try to stay at least 71ft away from others at all times. Wear a face covering. Wash and sanitize your hands regularly and avoid going anywhere that is not necessary.  KEEP IN MIND THAT THE COVID TEST IS NOT 100% ACCURATE AND YOU SHOULD STILL DO EVERYTHING TO PREVENT POTENTIAL SPREAD OF VIRUS TO OTHERS (WEAR MASK, WEAR GLOVES, [place] HANDS AND SANITIZE REGULARLY). IF INITIAL TEST IS NEGATIVE, THIS MAY NOT MEAN YOU ARE DEFINITELY NEGATIVE. MOST ACCURATE TESTING IS DONE 5-7 DAYS AFTER EXPOSURE.   It is not advised by CDC to get re-tested after receiving a positive COVID test since you can still test positive for weeks to months after you have already cleared the virus.   *If you have not been vaccinated for COVID, I strongly suggest you consider getting vaccinated as long as there are no contraindications.       ED Prescriptions     Medication Sig Dispense Auth. Provider   molnupiravir EUA (LAGEVRIO) 200 MG CAPS capsule Take 4 capsules (800 mg total) by mouth 2 (two) times daily for 5 days. 40 capsule Danton Clap, PA-C      PDMP not  reviewed this encounter.   Danton Clap, PA-C 08/03/21 1350

## 2021-08-03 NOTE — ED Triage Notes (Signed)
Pt tested positive for Covid at home 2 days ago. Pt has fever of 100.5 yesterday and today. No breathing problems. Fatigue and body aches.

## 2021-08-03 NOTE — Discharge Instructions (Signed)
Isolate the rest of the day today and then you may go back out into the public but you need to wear a mask for 5 more days.  I have sent antiviral medication for COVID to the pharmacy for you.  You have started by tonight for to be of any potential use.  If symptomatic, go home and rest. Push fluids. Take Tylenol as needed for discomfort. Gargle warm salt water. Throat lozenges. Take Mucinex DM or Robitussin for cough. Humidifier in bedroom to ease coughing. Warm showers. Also review the COVID handout for more information.  COVID-19 INFECTION: The incubation period of COVID-19 is approximately 14 days after exposure, with most symptoms developing in roughly 4-5 days. Symptoms may range in severity from mild to critically severe. Roughly 80% of those infected will have mild symptoms. People of any age may become infected with COVID-19 and have the ability to transmit the virus. The most common symptoms include: fever, fatigue, cough, body aches, headaches, sore throat, nasal congestion, shortness of breath, nausea, vomiting, diarrhea, changes in smell and/or taste.    COURSE OF ILLNESS Some patients may begin with mild disease which can progress quickly into critical symptoms. If your symptoms are worsening please call ahead to the Emergency Department and proceed there for further treatment. Recovery time appears to be roughly 1-2 weeks for mild symptoms and 3-6 weeks for severe disease.   GO IMMEDIATELY TO ER FOR FEVER YOU ARE UNABLE TO GET DOWN WITH TYLENOL, BREATHING PROBLEMS, CHEST PAIN, FATIGUE, LETHARGY, INABILITY TO EAT OR DRINK, ETC  QUARANTINE AND ISOLATION: To help decrease the spread of COVID-19 please remain isolated if you have COVID infection or are highly suspected to have COVID infection. This means -stay home and isolate to one room in the home if you live with others. Do not share a bed or bathroom with others while ill, sanitize and wipe down all countertops and keep common areas clean  and disinfected. Stay home for 5 days. If you have no symptoms or your symptoms are resolving after 5 days, you can leave your house. Continue to wear a mask around others for 5 additional days. If you have been in close contact (within 6 feet) of someone diagnosed with COVID 19, you are advised to quarantine in your home for 14 days as symptoms can develop anywhere from 2-14 days after exposure to the virus. If you develop symptoms, you  must isolate.  Most current guidelines for COVID after exposure -unvaccinated: isolate 5 days and strict mask use x 5 days. Test on day 5 is possible -vaccinated: wear mask x 10 days if symptoms do not develop -You do not necessarily need to be tested for COVID if you have + exposure and  develop symptoms. Just isolate at home x10 days from symptom onset During this global pandemic, CDC advises to practice social distancing, try to stay at least 55ft away from others at all times. Wear a face covering. Wash and sanitize your hands regularly and avoid going anywhere that is not necessary.  KEEP IN MIND THAT THE COVID TEST IS NOT 100% ACCURATE AND YOU SHOULD STILL DO EVERYTHING TO PREVENT POTENTIAL SPREAD OF VIRUS TO OTHERS (WEAR MASK, WEAR GLOVES, [place] HANDS AND SANITIZE REGULARLY). IF INITIAL TEST IS NEGATIVE, THIS MAY NOT MEAN YOU ARE DEFINITELY NEGATIVE. MOST ACCURATE TESTING IS DONE 5-7 DAYS AFTER EXPOSURE.   It is not advised by CDC to get re-tested after receiving a positive COVID test since you can still test positive  for weeks to months after you have already cleared the virus.   *If you have not been vaccinated for COVID, I strongly suggest you consider getting vaccinated as long as there are no contraindications.

## 2021-08-05 ENCOUNTER — Telehealth: Payer: Self-pay

## 2021-08-05 NOTE — Telephone Encounter (Signed)
Reviewed patient chart was seen in urgent care was given Molnupiravir.  I have called and spoke to husband nothing further needed from our office.  [place] Night - Client TELEPHONE ADVICE RECORD [facility] Patient Name: Deanna Cobb Gender: Female DOB: 1935/02/03 Age: 86 Y 18 M 24 D Return Phone Number: [phone] (Primary), [phone] (Secondary) Address: City/ State/ Zip: [place]  [address] Client [place] Night - Client Client Site [place] Provider Waunita Schooner- MD Contact Type Call Who Is Calling Patient / Member / Family / Caregiver Call Type Triage / Clinical Caller Name Riona Lahti Relationship To Patient Spouse Return Phone Number [phone] (Primary) Chief Complaint Fever (non-urgent symptom) (greater than THREE MONTHS old) Reason for Call Symptomatic / Request for [place] His wife has covid. Wants to get paxlovid called in. Today has fever 100.5, cough, runny nose, body aches Translation No Nurse Assessment Nurse: Eugenio Hoes, RN, Sarah Date/Time Eilene Ghazi Time): 08/03/2021 10:35:55 AM Confirm and document reason for call. If symptomatic, describe symptoms. ---Caller states wife tested positive for COVID yesterday morning. She has had symptoms since Monday. Current temp is 100.5. She has runny nose and non productive cough. Does the patient have any new or worsening symptoms? ---Yes Will a triage be completed? ---Yes Related visit to physician within the last 2 weeks? ---No Does the PT have any chronic conditions? (i.e. diabetes, asthma, this includes High risk factors for pregnancy, etc.) ---Yes List chronic conditions. ---thyroid disease Is this a behavioral health or substance abuse call? ---No Guidelines Guideline Title Affirmed Question Affirmed Notes Nurse Date/Time ([place] Time) COVID-19 - Diagnosed or Suspected [1] HIGH  RISK for severe COVID complications (e.g., weak immune system, age > 22 years, obesity with BMI 30 or higher, Eugenio Hoes, RN, Judson Roch 08/03/2021 10:38:49 AM PLEASE NOTE: All timestamps contained within this report are represented as [place] Standard Time. CONFIDENTIALTY NOTICE: This fax transmission is intended only for the addressee. It contains information that is legally privileged, confidential or otherwise protected from use or disclosure. If you are not the intended recipient, you are strictly prohibited from reviewing, disclosing, copying using or disseminating any of this information or taking any action in reliance on or regarding this information. If you have received this fax in error, please notify us immediately by telephone so that we can arrange for its return to [place]. Phone: [phone], Toll-Free: [phone], Fax: [phone] Page: 2 of 2 Call Id: [id number] Guidelines Guideline Title Affirmed Question Affirmed Notes Nurse Date/Time Eilene Ghazi Time) pregnant, chronic lung disease or other chronic medical condition) AND [2] COVID symptoms (e.g., cough, fever) (Exceptions: Already seen by PCP and no new or worsening symptoms.) Disp. Time Eilene Ghazi Time) Disposition Final User 08/03/2021 10:48:03 AM See HCP within 4 Hours (or PCP triage) Yes Eugenio Hoes, RN, Sarah Disposition Overriden: Call PCP within 24 Hours Override Reason: Specify reason. (Please document in 'advice recommended' section) Caller Disagree/Comply Comply Caller Understands Yes PreDisposition Call Doctor Care Advice Given Per Guideline SEE HCP (OR PCP TRIAGE) WITHIN 4 HOURS: * IF OFFICE WILL BE CLOSED AND NO PCP (PRIMARY CARE PROVIDER) SECOND-LEVEL TRIAGE: You need to be seen within the next 3 or 4 hours. A nearby Urgent Care Center [facility]) is often a good source of care. Another choice is to go to the ED. Go sooner if you become worse. CALL BACK IF: * You become worse CARE ADVICE given per COVID-19 - DIAGNOSED  OR SUSPECTED (Adult) guideline. FEVER  MEDICINES: * For fevers above 101 F (38.3 C) take either acetaminophen or ibuprofen. COUGH MEDICINES: * COUGH DROPS: Over-the-counter cough drops can help a lot, especially for mild coughs. They soothe an irritated throat and remove the tickle sensation in the back of the throat. Cough drops are easy to carry with you. Comments User: Fredric Mare, RN Date/Time Eilene Ghazi Time): 08/03/2021 10:48:02 AM Upgraded outcome d/t caller's age, duration of symptoms, and perceived worsening of symptoms Referrals [facility] [place]

## 2021-08-05 NOTE — Telephone Encounter (Signed)
Added from another call but all has been addressed with patient per last note.   [place] Night - Client TELEPHONE ADVICE RECORD [facility] Patient Name: Deanna Cobb [place] Gender: Female DOB: 06-May-1935 Age: 86 Y 9 M 23 D Return Phone Number: [phone] (Primary), [phone] (Secondary) Address: City/ State/ Zip: [place]  [address] Client [facility] Night - Client Client Site [place] Provider Waunita Schooner- MD Contact Type Call Who Is Calling Patient / Member / Family / Caregiver Call Type Triage / Clinical Relationship To Patient Self Return Phone Number [phone] (Primary) Chief Complaint Cough Reason for Call Symptomatic / Request for [place] states his wife tested positive for covid. On Monday she had a runny nose, cough, and diarrhea. She is still having the same symptoms today. Translation No Nurse Assessment Nurse: Rolena Infante, RN, Patrice Date/Time (Eastern Time): 08/02/2021 10:19:58 AM Confirm and document reason for call. If symptomatic, describe symptoms. ---Caller states she had runny nose, cough. Diarrhea started yesterday. No SOB, No chest pain COVID positive, last night test. Fever, low grade Tuesday night 100 No fever noted yesterday. Does the patient have any new or worsening symptoms? ---Yes Will a triage be completed? ---Yes Related visit to physician within the last 2 weeks? ---No Does the PT have any chronic conditions? (i.e. diabetes, asthma, this includes High risk factors for pregnancy, etc.) ---Yes List chronic conditions. ---Thyroid Disease Is this a behavioral health or substance abuse call? ---No Guidelines Guideline Title Affirmed Question Affirmed Notes Nurse Date/Time (Eastern Time) COVID-19 - Diagnosed or Suspected [1] COVID-19 diagnosed by positive lab test (e.g., PCR, rapid self-test kit) AND [2] mild  symptoms (e.g., cough, fever, others) AND [3] no Rolena Infante, RN, Patrice 08/02/2021 10:23:56 AM PLEASE NOTE: All timestamps contained within this report are represented as [place] Standard Time. CONFIDENTIALTY NOTICE: This fax transmission is intended only for the addressee. It contains information that is legally privileged, confidential or otherwise protected from use or disclosure. If you are not the intended recipient, you are strictly prohibited from reviewing, disclosing, copying using or disseminating any of this information or taking any action in reliance on or regarding this information. If you have received this fax in error, please notify us immediately by telephone so that we can arrange for its return to [place]. Phone: [phone], Toll-Free: [phone], Fax: [phone] Page: 2 of 2 Call Id: [id number] Guidelines Guideline Title Affirmed Question Affirmed Notes Nurse Date/Time Eilene Ghazi Time) complications or SOB Disp. Time Eilene Ghazi Time) Disposition Final User 08/02/2021 10:30:30 AM Home Care Yes Rolena Infante, RN, Patrice Caller Disagree/Comply Comply Caller Understands Yes PreDisposition Go to Urgent Care/Walk-In Clinic Care Advice Given Per Guideline HOME CARE: * You should be able to treat this at home. REASSURANCE AND EDUCATION - POSITIVE COVID-19 LAB TEST AND MILD SYMPTOMS: CALL BACK IF: * Fever over 103 F (39.4 C) * Chest pain or difficulty breathing occurs * You become worse After Care Instructions Given Call Event Type User Date / Time Description Education document email Rolena Infante, RN, Sharl Ma 08/02/2021 10:30:24 AM COVID-19 Sharma Covert

## 2021-08-23 ENCOUNTER — Ambulatory Visit
Admission: RE | Admit: 2021-08-23 | Discharge: 2021-08-23 | Disposition: A | Discharge status: Home / Self Care | Payer: Medicare Other | Source: Ambulatory Visit | Attending: Family Medicine | Admitting: Family Medicine

## 2021-08-23 DIAGNOSIS — Z1231 Encounter for screening mammogram for malignant neoplasm of breast: Secondary | ICD-10-CM | POA: Diagnosis not present

## 2021-08-23 DIAGNOSIS — Z780 Asymptomatic menopausal state: Secondary | ICD-10-CM | POA: Diagnosis not present

## 2021-08-23 DIAGNOSIS — M8588 Other specified disorders of bone density and structure, other site: Secondary | ICD-10-CM | POA: Diagnosis not present

## 2021-08-23 DIAGNOSIS — M810 Age-related osteoporosis without current pathological fracture: Secondary | ICD-10-CM | POA: Diagnosis not present

## 2021-08-27 DIAGNOSIS — D485 Neoplasm of uncertain behavior of skin: Secondary | ICD-10-CM | POA: Diagnosis not present

## 2021-08-27 DIAGNOSIS — C44799 Other specified malignant neoplasm of skin of left lower limb, including hip: Secondary | ICD-10-CM | POA: Diagnosis not present

## 2021-08-27 DIAGNOSIS — R208 Other disturbances of skin sensation: Secondary | ICD-10-CM | POA: Diagnosis not present

## 2021-08-28 ENCOUNTER — Ambulatory Visit (INDEPENDENT_AMBULATORY_CARE_PROVIDER_SITE_OTHER): Payer: Medicare Other | Admitting: Podiatry

## 2021-08-28 ENCOUNTER — Encounter: Payer: Self-pay | Admitting: Podiatry

## 2021-08-28 ENCOUNTER — Other Ambulatory Visit: Payer: Self-pay

## 2021-08-28 DIAGNOSIS — I6523 Occlusion and stenosis of bilateral carotid arteries: Secondary | ICD-10-CM | POA: Diagnosis not present

## 2021-08-28 DIAGNOSIS — L600 Ingrowing nail: Secondary | ICD-10-CM | POA: Diagnosis not present

## 2021-08-28 MED ORDER — NEOMYCIN-POLYMYXIN-HC 1 % OT SOLN: OTIC | 1 refills | Status: DC

## 2021-08-28 NOTE — Progress Notes (Signed)
Subjective:  Patient ID: Deanna Cobb, female    DOB: Apr 17, 1935,  MRN: [id number] HPI Chief Complaint  Patient presents with   Toe Pain    Hallux right - lateral border, ingrown, tender with gym shoes   New Patient (Initial Visit)    Est pt 2018    86 y.o. female presents with the above complaint.   ROS: She denies fever chills nausea vomiting muscle aches pains calf pain back pain chest pain shortness of breath.  Past Medical History:  Diagnosis Date   Accidental fall from chair    Allergy    multiple food allergies   Asthma    as a child   Colitis 2012   Coronary atherosclerosis    D/T lipid rich plaque   GERD (gastroesophageal reflux disease)    H. pylori infection 2012   Heart murmur    Hyperlipidemia    Hypothyroidism    Insomnia    Internal hemorrhoids    Mitral valve prolapse    Osteoporosis    Sarcoidosis 1966   TIA (transient ischemic attack) 04/2005   Visit for suture removal    Past Surgical History:  Procedure Laterality Date   ABDOMINAL HYSTERECTOMY  1972   LYMPH NODE BIOPSY  1966   TONSILLECTOMY      Current Outpatient Medications:    NEOMYCIN-POLYMYXIN-HYDROCORTISONE (CORTISPORIN) 1 % SOLN OTIC solution, Apply 1-2 drops to toe BID after soaking, Disp: 10 mL, Rfl: 1   cholecalciferol (VITAMIN D) 1000 UNITS tablet, Take 1,000 Units by mouth daily as needed., Disp: , Rfl:    dicyclomine (BENTYL) 10 MG capsule, Take 1 capsule (10 mg total) by mouth 3 (three) times daily as needed for spasms (IBS)., Disp: 90 capsule, Rfl: 1   Folate-B12-Intrinsic Factor (INTRINSI B12-FOLATE) 540-086-76 MCG-MCG-MG TABS, Take 1 tablet by mouth daily., Disp: , Rfl:    MAGNESIUM PO, Take 1 capsule by mouth at bedtime., Disp: , Rfl:    Menaquinone-7 (VITAMIN K2 PO), Take 1 capsule by mouth in the morning., Disp: , Rfl:    Multiple Vitamins-Minerals (PRESERVISION AREDS 2) CAPS, Take 1 tablet by mouth 2 (two) times daily., Disp: , Rfl:    Omega-3 Fatty Acids (EPA) 1000 MG  CAPS, Take 1 capsule by mouth 3 (three) times daily. , Disp: , Rfl:    Probiotic Product (PROBIOTIC ADVANCED PO), Take by mouth., Disp: , Rfl:    thyroid (ARMOUR THYROID) 15 MG tablet, Take 1 tablet (15 mg total) by mouth daily., Disp: 90 tablet, Rfl: 3  Allergies  Allergen Reactions   Clindamycin Other (See Comments)   Penicillin G Hives and Rash   Clindamycin/Lincomycin    Penicillins     REACTION: hives   Review of Systems Objective:  There were no vitals filed for this visit.  General: Well developed, nourished, in no acute distress, alert and oriented x3   Dermatological: Skin is warm, dry and supple bilateral. Nails x 10 are well maintained; remaining integument appears unremarkable at this time. There are no open sores, no preulcerative lesions, no rash or signs of infection present.  Pain with a small bump with mild erythema along the proximal fibular border of the hallux right.  Appears to be similar to an ingrown nail most likely more of a callus due to the juxtaposition of the second toe.  Vascular: Dorsalis Pedis artery and Posterior Tibial artery pedal pulses are 2/4 bilateral with immedate capillary fill time. Pedal hair growth present. No varicosities and no lower extremity edema  present bilateral.   Neruologic: Grossly intact via light touch bilateral. Vibratory intact via tuning fork bilateral. Protective threshold with Semmes Wienstein monofilament intact to all pedal sites bilateral. Patellar and Achilles deep tendon reflexes 2+ bilateral. No Babinski or clonus noted bilateral.   Musculoskeletal: No gross boney pedal deformities bilateral. No pain, crepitus, or limitation noted with foot and ankle range of motion bilateral. Muscular strength 5/5 in all groups tested bilateral.  Gait: Unassisted, Nonantalgic.    Radiographs:  None taken  Assessment & Plan:   Assessment: Ingrown nail fibular border hallux right  Plan: Chemical matricectomy fibular border hallux  right performed tolerated procedure well.  She was given both oral and written home-going instruction for the care and soaking of the toe.  Follow-up with her in 1 to 2 weeks.  She was given both oral and written home-going structure for the care and soaking of the toe as well as a prescription for Cortisporin Otic to be applied twice daily after soaking.     Deanna Cobb T. [place], [place]

## 2021-08-28 NOTE — Patient Instructions (Signed)

## 2021-09-18 ENCOUNTER — Ambulatory Visit (INDEPENDENT_AMBULATORY_CARE_PROVIDER_SITE_OTHER): Payer: Medicare Other | Admitting: Podiatry

## 2021-09-18 ENCOUNTER — Other Ambulatory Visit: Payer: Self-pay

## 2021-09-18 ENCOUNTER — Encounter: Payer: Self-pay | Admitting: Podiatry

## 2021-09-18 DIAGNOSIS — L600 Ingrowing nail: Secondary | ICD-10-CM

## 2021-09-18 DIAGNOSIS — Z9889 Other specified postprocedural states: Secondary | ICD-10-CM

## 2021-09-18 NOTE — Progress Notes (Signed)
She presents today for follow-up of her nail procedure hallux right she states has been doing really well as she refers to the fibular border of the hallux right she states that it feels much better.  States that she has not soaked in the Betadine and warm water in 2 days.  Objective: Vital signs are stable she alert and oriented x3.  Pulses are palpable.  Scab is present to the fibular border of the hallux right.  No erythema cellulitis drainage or odor.  Assessment: Well-healing surgical toe.  Plan: Continue soak Epson salts and warm water every other day until the scab is loosened I will follow-up with her with any questions or concerns.

## 2021-10-23 DIAGNOSIS — H353132 Nonexudative age-related macular degeneration, bilateral, intermediate dry stage: Secondary | ICD-10-CM | POA: Diagnosis not present

## 2021-10-24 DIAGNOSIS — C44729 Squamous cell carcinoma of skin of left lower limb, including hip: Secondary | ICD-10-CM | POA: Diagnosis not present

## 2021-10-24 DIAGNOSIS — L905 Scar conditions and fibrosis of skin: Secondary | ICD-10-CM | POA: Diagnosis not present

## 2021-10-28 NOTE — Progress Notes (Signed)
Left VM asking for a call back

## 2021-12-17 DIAGNOSIS — E039 Hypothyroidism, unspecified: Secondary | ICD-10-CM | POA: Diagnosis not present

## 2021-12-17 DIAGNOSIS — E782 Mixed hyperlipidemia: Secondary | ICD-10-CM | POA: Diagnosis not present

## 2021-12-24 DIAGNOSIS — Z0000 Encounter for general adult medical examination without abnormal findings: Secondary | ICD-10-CM | POA: Diagnosis not present

## 2022-01-23 ENCOUNTER — Ambulatory Visit: Payer: Medicare Other

## 2022-04-04 DIAGNOSIS — D2261 Melanocytic nevi of right upper limb, including shoulder: Secondary | ICD-10-CM | POA: Diagnosis not present

## 2022-04-04 DIAGNOSIS — D2272 Melanocytic nevi of left lower limb, including hip: Secondary | ICD-10-CM | POA: Diagnosis not present

## 2022-04-04 DIAGNOSIS — L570 Actinic keratosis: Secondary | ICD-10-CM | POA: Diagnosis not present

## 2022-04-04 DIAGNOSIS — D2262 Melanocytic nevi of left upper limb, including shoulder: Secondary | ICD-10-CM | POA: Diagnosis not present

## 2022-04-04 DIAGNOSIS — Z85828 Personal history of other malignant neoplasm of skin: Secondary | ICD-10-CM | POA: Diagnosis not present

## 2022-04-21 DIAGNOSIS — H353131 Nonexudative age-related macular degeneration, bilateral, early dry stage: Secondary | ICD-10-CM | POA: Diagnosis not present

## 2022-06-04 DIAGNOSIS — Z23 Encounter for immunization: Secondary | ICD-10-CM | POA: Diagnosis not present

## 2022-06-05 DIAGNOSIS — D485 Neoplasm of uncertain behavior of skin: Secondary | ICD-10-CM | POA: Diagnosis not present

## 2022-06-05 DIAGNOSIS — C44722 Squamous cell carcinoma of skin of right lower limb, including hip: Secondary | ICD-10-CM | POA: Diagnosis not present

## 2022-06-05 DIAGNOSIS — R208 Other disturbances of skin sensation: Secondary | ICD-10-CM | POA: Diagnosis not present

## 2022-06-17 DIAGNOSIS — E039 Hypothyroidism, unspecified: Secondary | ICD-10-CM | POA: Diagnosis not present

## 2022-06-23 DIAGNOSIS — C44722 Squamous cell carcinoma of skin of right lower limb, including hip: Secondary | ICD-10-CM | POA: Diagnosis not present

## 2022-06-24 DIAGNOSIS — Z136 Encounter for screening for cardiovascular disorders: Secondary | ICD-10-CM | POA: Diagnosis not present

## 2022-06-24 DIAGNOSIS — E039 Hypothyroidism, unspecified: Secondary | ICD-10-CM | POA: Diagnosis not present

## 2022-06-24 DIAGNOSIS — R32 Unspecified urinary incontinence: Secondary | ICD-10-CM | POA: Diagnosis not present

## 2022-06-24 DIAGNOSIS — M810 Age-related osteoporosis without current pathological fracture: Secondary | ICD-10-CM | POA: Diagnosis not present

## 2022-06-26 DIAGNOSIS — Z48817 Encounter for surgical aftercare following surgery on the skin and subcutaneous tissue: Secondary | ICD-10-CM | POA: Diagnosis not present

## 2022-07-08 DIAGNOSIS — H6122 Impacted cerumen, left ear: Secondary | ICD-10-CM | POA: Diagnosis not present

## 2022-07-08 DIAGNOSIS — H90A22 Sensorineural hearing loss, unilateral, left ear, with restricted hearing on the contralateral side: Secondary | ICD-10-CM | POA: Diagnosis not present

## 2022-07-10 DIAGNOSIS — N39492 Postural (urinary) incontinence: Secondary | ICD-10-CM | POA: Diagnosis not present

## 2022-07-10 DIAGNOSIS — M62838 Other muscle spasm: Secondary | ICD-10-CM | POA: Diagnosis not present

## 2022-07-10 DIAGNOSIS — M6281 Muscle weakness (generalized): Secondary | ICD-10-CM | POA: Diagnosis not present

## 2022-07-23 DIAGNOSIS — M9902 Segmental and somatic dysfunction of thoracic region: Secondary | ICD-10-CM | POA: Diagnosis not present

## 2022-07-23 DIAGNOSIS — M9903 Segmental and somatic dysfunction of lumbar region: Secondary | ICD-10-CM | POA: Diagnosis not present

## 2022-07-23 DIAGNOSIS — M5431 Sciatica, right side: Secondary | ICD-10-CM | POA: Diagnosis not present

## 2022-07-23 DIAGNOSIS — M4716 Other spondylosis with myelopathy, lumbar region: Secondary | ICD-10-CM | POA: Diagnosis not present

## 2022-08-13 DIAGNOSIS — H90A22 Sensorineural hearing loss, unilateral, left ear, with restricted hearing on the contralateral side: Secondary | ICD-10-CM | POA: Diagnosis not present

## 2022-09-15 ENCOUNTER — Encounter: Payer: Self-pay | Admitting: Student

## 2022-09-15 ENCOUNTER — Ambulatory Visit: Payer: Medicare Other | Admitting: Student

## 2022-09-15 VITALS — BP 118/63 | HR 77 | Temp 97.6°F | Ht 62.0 in | Wt 90.0 lb

## 2022-09-15 DIAGNOSIS — C44729 Squamous cell carcinoma of skin of left lower limb, including hip: Secondary | ICD-10-CM | POA: Diagnosis not present

## 2022-09-15 DIAGNOSIS — K589 Irritable bowel syndrome without diarrhea: Secondary | ICD-10-CM

## 2022-09-15 DIAGNOSIS — I341 Nonrheumatic mitral (valve) prolapse: Secondary | ICD-10-CM | POA: Diagnosis not present

## 2022-09-15 DIAGNOSIS — R32 Unspecified urinary incontinence: Secondary | ICD-10-CM | POA: Diagnosis not present

## 2022-09-15 DIAGNOSIS — E039 Hypothyroidism, unspecified: Secondary | ICD-10-CM | POA: Diagnosis not present

## 2022-09-15 DIAGNOSIS — K219 Gastro-esophageal reflux disease without esophagitis: Secondary | ICD-10-CM | POA: Diagnosis not present

## 2022-09-15 DIAGNOSIS — H9112 Presbycusis, left ear: Secondary | ICD-10-CM | POA: Diagnosis not present

## 2022-09-15 DIAGNOSIS — I739 Peripheral vascular disease, unspecified: Secondary | ICD-10-CM | POA: Diagnosis not present

## 2022-09-15 DIAGNOSIS — H6122 Impacted cerumen, left ear: Secondary | ICD-10-CM

## 2022-09-15 DIAGNOSIS — I6523 Occlusion and stenosis of bilateral carotid arteries: Secondary | ICD-10-CM | POA: Diagnosis not present

## 2022-09-15 DIAGNOSIS — M810 Age-related osteoporosis without current pathological fracture: Secondary | ICD-10-CM | POA: Diagnosis not present

## 2022-09-15 MED ORDER — DEBROX 6.5 % OT SOLN: 5.0000 [drp] | Freq: Two times a day (BID) | OTIC | 0 refills | Status: AC

## 2022-09-15 MED ORDER — LEVOTHYROXINE SODIUM 25 MCG PO TABS: 25.0000 ug | ORAL_TABLET | Freq: Every day | ORAL | 3 refills | Status: DC

## 2022-09-15 NOTE — Patient Instructions (Addendum)
Nice to meet you!  I would like to transition you from Armour Thyroid to Levothyroxine 25 mcg daily. You can stop the armour thyroid immediately and start the new medication. We will recheck in 6 weeks.   Please use Debrox drops for 5 days each month to keep your earwax soft. If you need to have them flushed at your next appointment, we can do it then.   I wrote you a consult for Urogynecology. You should receive a call before the end of the week to schedule. Here is a link for specialists - [email or web address]  [place] Pink, MD was the individual.   Dr. Nena Polio in clinic MWF PM Jessica Eubanks/Chrae in clinic Tuesday/Thursday AM   Please come back for labs in 6 weeks to check thyroid, CBC, CMP.
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
## 2022-09-18 ENCOUNTER — Other Ambulatory Visit: Payer: Self-pay | Admitting: Student

## 2022-09-18 DIAGNOSIS — Z1231 Encounter for screening mammogram for malignant neoplasm of breast: Secondary | ICD-10-CM

## 2022-10-23 DIAGNOSIS — H353131 Nonexudative age-related macular degeneration, bilateral, early dry stage: Secondary | ICD-10-CM | POA: Diagnosis not present

## 2022-10-23 DIAGNOSIS — Z961 Presence of intraocular lens: Secondary | ICD-10-CM | POA: Diagnosis not present

## 2022-10-23 DIAGNOSIS — M3501 Sicca syndrome with keratoconjunctivitis: Secondary | ICD-10-CM | POA: Diagnosis not present

## 2022-10-27 ENCOUNTER — Encounter: Payer: Self-pay | Admitting: *Deleted

## 2022-10-27 DIAGNOSIS — E559 Vitamin D deficiency, unspecified: Secondary | ICD-10-CM | POA: Diagnosis not present

## 2022-10-27 DIAGNOSIS — E039 Hypothyroidism, unspecified: Secondary | ICD-10-CM | POA: Diagnosis not present

## 2022-10-27 DIAGNOSIS — E782 Mixed hyperlipidemia: Secondary | ICD-10-CM | POA: Diagnosis not present

## 2022-10-27 DIAGNOSIS — I6523 Occlusion and stenosis of bilateral carotid arteries: Secondary | ICD-10-CM | POA: Diagnosis not present

## 2022-10-27 LAB — CBC AND DIFFERENTIAL
HCT: 44 (ref 36–46)
Hemoglobin: 15 (ref 12.0–16.0)
Neutrophils Absolute: 3547
Platelets: 274 10*3/uL (ref 150–400)
WBC: 6.9

## 2022-10-27 LAB — VITAMIN D 25 HYDROXY (VIT D DEFICIENCY, FRACTURES): Vit D, 25-Hydroxy: 78

## 2022-10-27 LAB — TSH: TSH: 2.5 (ref 0.41–5.90)

## 2022-10-27 LAB — BASIC METABOLIC PANEL
BUN: 18 (ref 4–21)
CO2: 32 — AB (ref 13–22)
Chloride: 101 (ref 99–108)
Creatinine: 0.7 (ref 0.5–1.1)
Glucose: 90
Potassium: 4.1 mEq/L (ref 3.5–5.1)
Sodium: 141 (ref 137–147)

## 2022-10-27 LAB — LIPID PANEL
Cholesterol: 237 — AB (ref 0–200)
HDL: 89 — AB (ref 35–70)
LDL Cholesterol: 129
Triglycerides: 87 (ref 40–160)

## 2022-10-27 LAB — HEPATIC FUNCTION PANEL
ALT: 14 U/L (ref 7–35)
AST: 18 (ref 13–35)
Alkaline Phosphatase: 68 (ref 25–125)
Bilirubin, Total: 1.2

## 2022-10-27 LAB — COMPREHENSIVE METABOLIC PANEL
Albumin: 4.5 (ref 3.5–5.0)
Calcium: 9.6 (ref 8.7–10.7)
Globulin: 2.4
eGFR: 84

## 2022-10-27 LAB — CBC: RBC: 4.7 (ref 3.87–5.11)

## 2022-10-28 ENCOUNTER — Encounter: Payer: Self-pay | Admitting: Student

## 2022-10-29 ENCOUNTER — Ambulatory Visit: Payer: Medicare Other | Admitting: Student

## 2022-10-29 ENCOUNTER — Encounter: Payer: Self-pay | Admitting: Student

## 2022-10-29 VITALS — BP 140/82 | HR 67 | Temp 97.6°F | Ht 62.0 in | Wt 91.0 lb

## 2022-10-29 DIAGNOSIS — Z8673 Personal history of transient ischemic attack (TIA), and cerebral infarction without residual deficits: Secondary | ICD-10-CM

## 2022-10-29 DIAGNOSIS — E782 Mixed hyperlipidemia: Secondary | ICD-10-CM | POA: Diagnosis not present

## 2022-10-29 DIAGNOSIS — R030 Elevated blood-pressure reading, without diagnosis of hypertension: Secondary | ICD-10-CM

## 2022-10-29 DIAGNOSIS — I6523 Occlusion and stenosis of bilateral carotid arteries: Secondary | ICD-10-CM

## 2022-10-29 DIAGNOSIS — E039 Hypothyroidism, unspecified: Secondary | ICD-10-CM | POA: Diagnosis not present

## 2022-10-29 NOTE — Progress Notes (Signed)
Location:  [facility]   Provider: Dr. Dewayne Shorter  Code Status: DNR Goals of Care:     10/29/2022    1:24 PM  Advanced Directives  Type of Advance Directive Living will;Healthcare Power of Attorney;Out of facility DNR (pink MOST or yellow form)  Copy of [place] in Chart? No - copy requested     Chief Complaint  Patient presents with   Medical Management of Chronic Issues    Medical Management of Chronic Issues. 6 week follow up and to discuss labs.    Quality Metric Gaps    HPI: Patient is a 88 y.o. female seen today for medical management of chronic diseases.    Patient was recently transitioned to levothyroxine. TSH wnl. Cholesterol levels slightly elevated. Patient continues to decline statin therapy. Previously discussed with cardiologist who performed a carotid ultrasound in 2016 which was normal-- deferred zetia at that time. She would prefer no medications at this time.  Past Medical History:  Diagnosis Date   Accidental fall from chair    Allergy    multiple food allergies   Asthma    as a child   Colitis 2012   Coronary atherosclerosis    D/T lipid rich plaque   GERD (gastroesophageal reflux disease)    H. pylori infection 2012   Heart murmur    Hyperlipidemia    Hypothyroidism    Insomnia    Internal hemorrhoids    Mitral valve prolapse    Osteoporosis    Sarcoidosis 1966   TIA (transient ischemic attack) 04/2005   Visit for suture removal     Past Surgical History:  Procedure Laterality Date   ABDOMINAL HYSTERECTOMY  1972   LYMPH NODE BIOPSY  1966   TONSILLECTOMY      Allergies  Allergen Reactions   Clindamycin Other (See Comments)   Penicillin G Hives and Rash   Clindamycin/Lincomycin    Penicillins     REACTION: hives    Outpatient Encounter Medications as of 10/29/2022  Medication Sig   carbamide peroxide (DEBROX) 6.5 % OTIC solution Place 5 drops into the left ear 2 (two) times daily. For 5 days  each month   Cholecalciferol (VITAMIN D-3) 125 MCG (5000 UT) TABS Take one tablet by mouth once daily.   cyanocobalamin (VITAMIN B12) 1000 MCG tablet Take 1,000 mcg by mouth daily.   dicyclomine (BENTYL) 10 MG capsule Take 1 capsule (10 mg total) by mouth 3 (three) times daily as needed for spasms (IBS).   levothyroxine (SYNTHROID) 25 MCG tablet Take 1 tablet (25 mcg total) by mouth daily.   MAGNESIUM PO Take 1 capsule by mouth at bedtime.   Menaquinone-7 (VITAMIN K2 PO) Take 1 capsule by mouth in the morning.   Multiple Vitamins-Minerals (PRESERVISION AREDS 2) CAPS Take 1 tablet by mouth 2 (two) times daily.   Omega-3 Fatty Acids (EPA) 1000 MG CAPS Take 1 capsule by mouth daily.   Probiotic Product (PROBIOTIC ADVANCED PO) Take 1 capsule by mouth daily as needed.   No facility-administered encounter medications on file as of 10/29/2022.    Review of Systems:  Review of Systems  All other systems reviewed and are negative.   Health Maintenance  Topic Date Due   Medicare Annual Wellness (AWV)  12/25/2022   COVID-19 Vaccine (10 - 2023-24 season) 11/12/2022   DTaP/Tdap/Td (3 - Td or Tdap) 05/28/2031   Pneumonia Vaccine 38+ Years old  Completed   INFLUENZA VACCINE  Completed  DEXA SCAN  Completed   Zoster Vaccines- Shingrix  Completed   HPV VACCINES  Aged Out    Physical Exam: Vitals:   10/29/22 1320  BP: (!) 140/82  Pulse: 67  Temp: 97.6 F (36.4 C)  SpO2: 98%  Weight: 91 lb (41.3 kg)  Height: 5' 2"$  (1.575 m)   Body mass index is 16.64 kg/m. Physical Exam Constitutional:      Appearance: Normal appearance.  Cardiovascular:     Rate and Rhythm: Normal rate and regular rhythm.     Pulses: Normal pulses.     Heart sounds: Normal heart sounds.  Pulmonary:     Effort: Pulmonary effort is normal.  Abdominal:     General: Abdomen is flat. Bowel sounds are normal.     Palpations: Abdomen is soft.  Musculoskeletal:        General: No swelling or tenderness.  Skin:     General: Skin is warm and dry.     Capillary Refill: Capillary refill takes 2 to 3 seconds.  Neurological:     Mental Status: She is alert and oriented to person, place, and time.     Gait: Gait normal.  Psychiatric:        Mood and Affect: Mood normal.     Labs reviewed: Basic Metabolic Panel: Recent Labs    10/27/22 0000  NA 141  K 4.1  CL 101  CO2 32*  BUN 18  CREATININE 0.7  CALCIUM 9.6  TSH 2.50   Liver Function Tests: Recent Labs    10/27/22 0000  AST 18  ALT 14  ALKPHOS 68  ALBUMIN 4.5   No results for input(s): "LIPASE", "AMYLASE" in the last 8760 hours. No results for input(s): "AMMONIA" in the last 8760 hours. CBC: Recent Labs    10/27/22 0000  WBC 6.9  NEUTROABS 3,547.00  HGB 15.0  HCT 44  PLT 274   Lipid Panel: Recent Labs    10/27/22 0000  CHOL 237*  HDL 89*  LDLCALC 129  TRIG 87   No results found for: "HGBA1C"  Procedures since last visit: No results found.  Assessment/Plan Elevated blood pressure reading in office without diagnosis of hypertension  Atherosclerosis of both carotid arteries  Hypothyroidism, unspecified type  HYPERLIPIDEMIA TYPE IIB / III  History of TIAs Explained to patient numerous women taking medication safely. Contacted cardiologist to determine his opinion regarding statin therapy (labs have been stable for years) vs repeating carotid ultrasound. Thyroid levels normal with transition to levothyroxine. CBC and CMP wnl. Vitamin D level wnl. Encouraged ambulatory BP checks for the next 2 weeks and mychart communication regarding readings to determine need for medications as well as determine need for statin therapy. If BP elevated, could be additional reason to support starting this medication.    Labs/tests ordered:  * No order type specified * Next appt:  Visit date not found

## 2022-10-29 NOTE — Patient Instructions (Addendum)
Nice to see you today!   Please check your blood pressure over the next few weeks and send me the readings via MyChart ~3/15.   I will let you know if Dr. Rockey Situ has additional information to share regarding starting Statin therapy.

## 2022-11-11 ENCOUNTER — Ambulatory Visit
Admission: RE | Admit: 2022-11-11 | Discharge: 2022-11-11 | Disposition: A | Discharge status: Home / Self Care | Payer: Medicare Other | Source: Ambulatory Visit | Attending: Student | Admitting: Student

## 2022-11-11 DIAGNOSIS — Z1231 Encounter for screening mammogram for malignant neoplasm of breast: Secondary | ICD-10-CM | POA: Diagnosis not present

## 2022-11-19 ENCOUNTER — Ambulatory Visit (INDEPENDENT_AMBULATORY_CARE_PROVIDER_SITE_OTHER): Payer: Medicare Other | Admitting: Obstetrics and Gynecology

## 2022-11-19 ENCOUNTER — Encounter: Payer: Self-pay | Admitting: Obstetrics and Gynecology

## 2022-11-19 ENCOUNTER — Other Ambulatory Visit (HOSPITAL_COMMUNITY)
Admission: RE | Admit: 2022-11-19 | Discharge: 2022-11-19 | Disposition: A | Discharge status: Home / Self Care | Payer: Medicare Other | Attending: Obstetrics and Gynecology | Admitting: Obstetrics and Gynecology

## 2022-11-19 VITALS — BP 154/74 | HR 78 | Ht 63.15 in | Wt 89.8 lb

## 2022-11-19 DIAGNOSIS — R350 Frequency of micturition: Secondary | ICD-10-CM | POA: Insufficient documentation

## 2022-11-19 DIAGNOSIS — N3281 Overactive bladder: Secondary | ICD-10-CM | POA: Diagnosis not present

## 2022-11-19 DIAGNOSIS — R319 Hematuria, unspecified: Secondary | ICD-10-CM

## 2022-11-19 DIAGNOSIS — N393 Stress incontinence (female) (male): Secondary | ICD-10-CM

## 2022-11-19 LAB — URINALYSIS, ROUTINE W REFLEX MICROSCOPIC
Bilirubin Urine: NEGATIVE
Glucose, UA: NEGATIVE mg/dL
Ketones, ur: NEGATIVE mg/dL
Leukocytes,Ua: NEGATIVE
Nitrite: NEGATIVE
Protein, ur: NEGATIVE mg/dL
Specific Gravity, Urine: 1.005 (ref 1.005–1.030)
pH: 6 (ref 5.0–8.0)

## 2022-11-19 LAB — POCT URINALYSIS DIPSTICK
Bilirubin, UA: NEGATIVE
Glucose, UA: NEGATIVE
Ketones, UA: NEGATIVE
Leukocytes, UA: NEGATIVE
Nitrite, UA: NEGATIVE
Protein, UA: NEGATIVE
Spec Grav, UA: 1.01 (ref 1.010–1.025)
Urobilinogen, UA: 0.2 E.U./dL
pH, UA: 6 (ref 5.0–8.0)

## 2022-11-19 NOTE — Progress Notes (Unsigned)
[facility] New Patient Evaluation and Consultation  Referring Provider: Dewayne Shorter, MD PCP: Dewayne Shorter, MD Date of Service: 11/19/2022  SUBJECTIVE Chief Complaint: New Patient (Initial Visit) Deanna Cobb is a 88 y.o. female here for a consult for incontinence with urgency in the morning. Pt said she also leaks when she cough and sneezes./)  History of Present Illness: Deanna Cobb is a 88 y.o. White or Caucasian female seen in consultation at the request of Dr. Unk Lightning for evaluation of incontinence.     Urinary Symptoms: Leaks urine with cough/ sneeze, laughing, with a full bladder, and with urgency Usually leaks on the way to the bathroom- has a "floodgate" when she wakes up in then morning.  Otherwise during the day leaks occasionally with cough or sneeze.  Pad use: rarely uses pads She is bothered by her UI symptoms. She has done some pelvic physical therapist at a [place] office about a year.   Day time voids- 5-6.  Nocturia: 0-1 times per night to void (if she limits drinking before bed). Voiding dysfunction: she empties her bladder well.  does not use a catheter to empty bladder.  When urinating, she feels she has no difficulties Drinks: 2 cups decaf hot tea in AM, otherwise water. Stops drinking everything by 7pm.   UTIs:  0  UTI's in the last year.   Denies history of blood in urine and kidney or bladder stones  Pelvic Organ Prolapse Symptoms:                  She Denies a feeling of a bulge the vaginal area.   Bowel Symptom: Bowel movements: 1 time(s) per day, sometimes has IBS flares  Stool consistency: soft  Straining: yes.  Splinting: no.  Incomplete evacuation: yes.  She Admits to accidental bowel leakage / fecal incontinence  Occurs: rarely- only related to IBS flare ups  Consistency with leakage: liquid Bowel regimen: none   Sexual Function Sexually active: no.   Pelvic Pain Denies pelvic pain  Past Medical  History:  Diagnosis Date   Accidental fall from chair    Allergy    multiple food allergies   Asthma    as a child   Colitis 2012   Coronary atherosclerosis    D/T lipid rich plaque   GERD (gastroesophageal reflux disease)    H. pylori infection 2012   Heart murmur    Hyperlipidemia    Hypothyroidism    Insomnia    Internal hemorrhoids    Mitral valve prolapse    Osteoporosis    Sarcoidosis 1966   TIA (transient ischemic attack) 04/2005   Visit for suture removal      Past Surgical History:   Past Surgical History:  Procedure Laterality Date   LYMPH NODE BIOPSY  09/08/1964   TONSILLECTOMY     VAGINAL HYSTERECTOMY  09/08/1970     Past OB/GYN History: OB History  Gravida Para Term Preterm AB Living  '4 4 4     4  '$ SAB IAB Ectopic Multiple Live Births          4    # Outcome Date GA Lbr Len/2nd Weight Sex Delivery Anes PTL Lv  4 Term      Vag-Spont     3 Term      Vag-Spont     2 Term      Vag-Spont     1 Term      Vag-Forceps  S/p hysterectomy   Medications: She has a current medication list which includes the following prescription(s): debrox, vitamin d-3, cyanocobalamin, dicyclomine, levothyroxine, magnesium, menaquinone-7, preservision areds 2, epa, and probiotic product.   Allergies: Patient is allergic to clindamycin, penicillin g, clindamycin/lincomycin, and penicillins.   Social History:  Social History   Tobacco Use   Smoking status: Never   Smokeless tobacco: Never  Vaping Use   Vaping Use: Never used  Substance Use Topics   Alcohol use: Yes    Comment: on glass of wine daily   Drug use: No    Relationship status: married She lives with husband.   She is not employed. Regular exercise: Yes: walking, yoga, weights History of abuse: No  Family History:   Family History  Problem Relation Age of Onset   Throat cancer Mother    Heart disease Sister    Alcohol abuse Sister    Alcohol abuse Brother    Heart attack Maternal  Grandmother    Osteoporosis Neg Hx      Review of Systems: Review of Systems  Constitutional:  Negative for fever, malaise/fatigue and weight loss.  Respiratory:  Negative for cough, shortness of breath and wheezing.   Cardiovascular:  Negative for chest pain, palpitations and leg swelling.  Gastrointestinal:  Negative for abdominal pain and blood in stool.  Genitourinary:  Negative for dysuria.  Musculoskeletal:  Negative for myalgias.  Skin:  Negative for rash.  Neurological:  Negative for dizziness and headaches.  Endo/Heme/Allergies:  Does not bruise/bleed easily.  Psychiatric/Behavioral:  Negative for depression. The patient is not nervous/anxious.      OBJECTIVE Physical Exam: Vitals:   11/19/22 0955  BP: (!) 154/74  Pulse: 78  Weight: 89 lb 12.8 oz (40.7 kg)  Height: 5' 3.15" (1.604 m)    Physical Exam Constitutional:      General: She is not in acute distress. Pulmonary:     Effort: Pulmonary effort is normal.  Abdominal:     General: There is no distension.     Palpations: Abdomen is soft.     Tenderness: There is no abdominal tenderness. There is no rebound.  Musculoskeletal:        General: No swelling. Normal range of motion.  Skin:    General: Skin is warm and dry.     Findings: No rash.  Neurological:     Mental Status: She is alert and oriented to person, place, and time.  Psychiatric:        Mood and Affect: Mood normal.        Behavior: Behavior normal.      GU / Detailed Urogynecologic Evaluation:  Pelvic Exam: Normal external female genitalia; Bartholin's and Skene's glands normal in appearance; urethral meatus normal in appearance, no urethral masses or discharge.   CST: negative  s/p hysterectomy: Speculum exam reveals normal vaginal mucosa with  atrophy and normal vaginal cuff.  Adnexa no mass, fullness, tenderness.    Pelvic floor strength I/V  Pelvic floor musculature: Right levator non-tender, Right obturator non-tender, Left  levator non-tender, Left obturator non-tender  POP-Q:   POP-Q  -3                                            Aa   -3  Ba  -8                                              C   2                                            Gh  3                                            Pb  8                                            tvl   -2                                            Ap  -2                                            Bp                                                 D      Rectal Exam:  Normal external rectum  Post-Void Residual (PVR) by Bladder Scan: In order to evaluate bladder emptying, we discussed obtaining a postvoid residual and she agreed to this procedure.  Procedure: The ultrasound unit was placed on the patient's abdomen in the suprapubic region after the patient had voided. A PVR of 14 ml was obtained by bladder scan.  Laboratory Results: POC urine: small blood   ASSESSMENT AND PLAN Ms. Origer is a 88 y.o. with:  1. Urinary frequency   2. Hematuria, unspecified type   3. SUI (stress urinary incontinence, female)   4. Overactive bladder    -pelvic PT- [facility] - may be interested in PTNS- is considering this but would have to drive from [place].    Jaquita Folds, MD   Medical Decision Making:  - Reviewed/ ordered a clinical laboratory test - Reviewed/ ordered a radiologic study - Reviewed/ ordered medicine test - Decision to obtain old records - Discussion of management of or test interpretation with an external physician / other healthcare professional  - Assessment requiring independent historian - Review and summation of prior records - Independent review of image, tracing or specimen

## 2022-11-20 LAB — URINE CULTURE: Culture: NO GROWTH

## 2022-12-11 DIAGNOSIS — D2262 Melanocytic nevi of left upper limb, including shoulder: Secondary | ICD-10-CM | POA: Diagnosis not present

## 2022-12-11 DIAGNOSIS — D2272 Melanocytic nevi of left lower limb, including hip: Secondary | ICD-10-CM | POA: Diagnosis not present

## 2022-12-11 DIAGNOSIS — D2261 Melanocytic nevi of right upper limb, including shoulder: Secondary | ICD-10-CM | POA: Diagnosis not present

## 2022-12-11 DIAGNOSIS — L821 Other seborrheic keratosis: Secondary | ICD-10-CM | POA: Diagnosis not present

## 2022-12-11 DIAGNOSIS — D2271 Melanocytic nevi of right lower limb, including hip: Secondary | ICD-10-CM | POA: Diagnosis not present

## 2022-12-11 DIAGNOSIS — D225 Melanocytic nevi of trunk: Secondary | ICD-10-CM | POA: Diagnosis not present

## 2023-04-23 DIAGNOSIS — Z23 Encounter for immunization: Secondary | ICD-10-CM | POA: Diagnosis not present

## 2023-04-29 DIAGNOSIS — H26491 Other secondary cataract, right eye: Secondary | ICD-10-CM | POA: Diagnosis not present

## 2023-04-29 DIAGNOSIS — M3501 Sicca syndrome with keratoconjunctivitis: Secondary | ICD-10-CM | POA: Diagnosis not present

## 2023-04-29 DIAGNOSIS — H353132 Nonexudative age-related macular degeneration, bilateral, intermediate dry stage: Secondary | ICD-10-CM | POA: Diagnosis not present

## 2023-05-22 DIAGNOSIS — Z23 Encounter for immunization: Secondary | ICD-10-CM | POA: Diagnosis not present

## 2023-07-30 IMAGING — MG MM DIGITAL SCREENING BILAT W/ TOMO AND CAD
8 series · 9 of 24 positions shown · non-contrast
Comparison: Previous exam(s).

CLINICAL DATA: Screening.

EXAM:
DIGITAL SCREENING BILATERAL MAMMOGRAM WITH TOMOSYNTHESIS AND CAD
TECHNIQUE: Bilateral screening digital craniocaudal and mediolateral oblique
mammograms were obtained. Bilateral screening digital breast
tomosynthesis was performed. The images were evaluated with
computer-aided detection.

[R CC synth-2D]
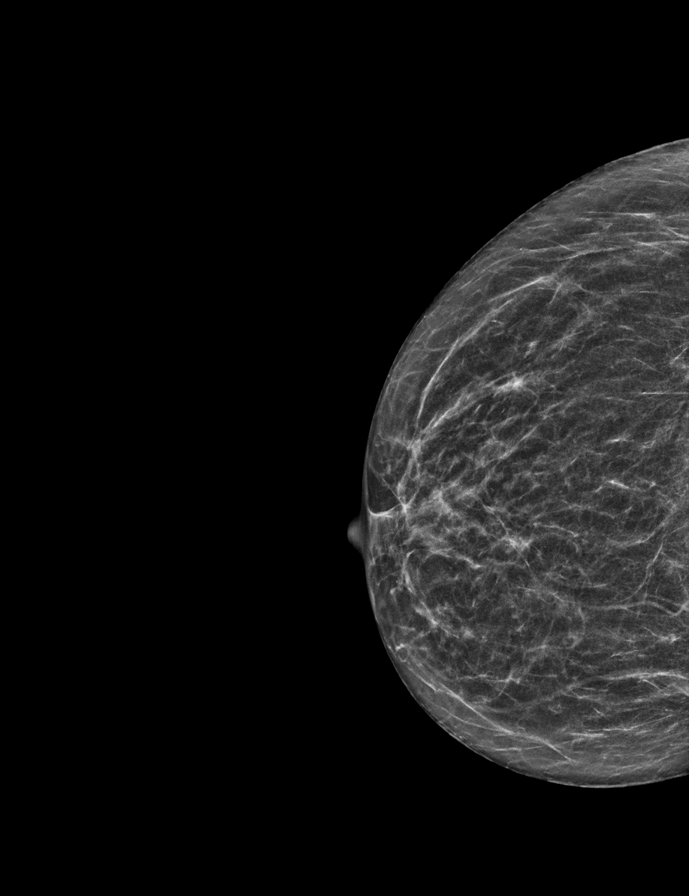

[L MLO synth-2D]
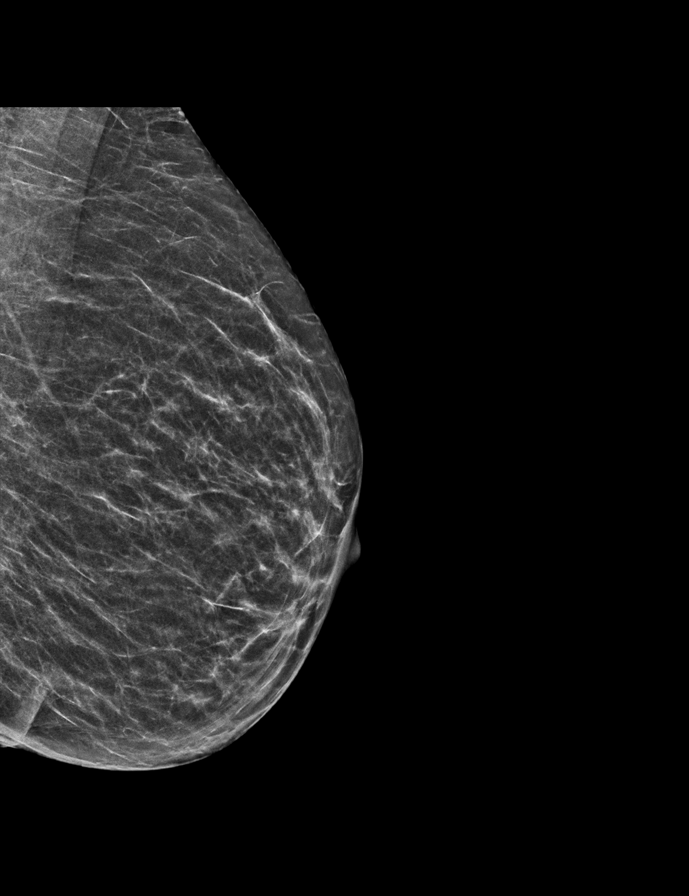

[R MLO synth-2D]
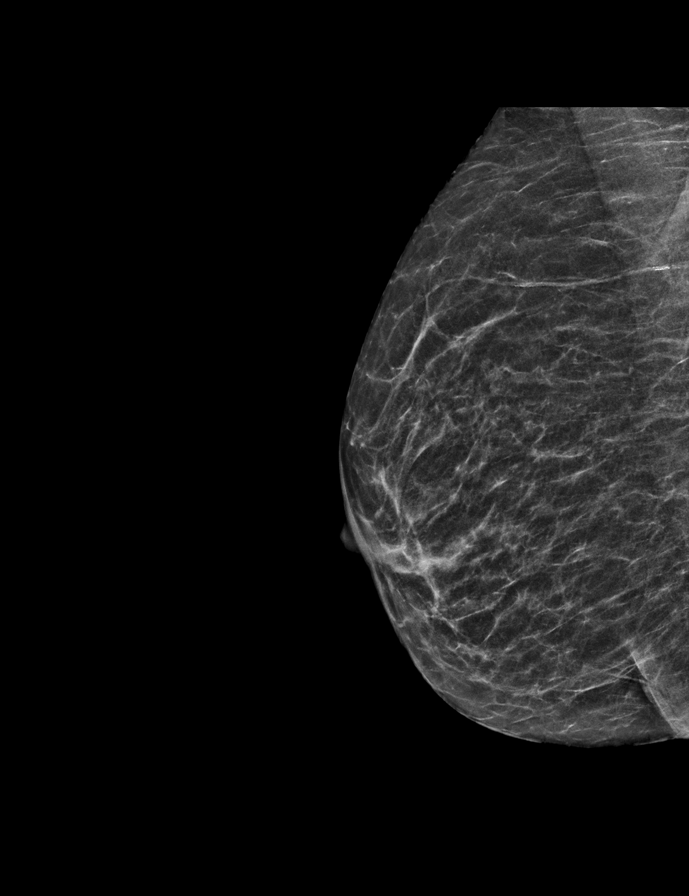

[L CC synth-2D]
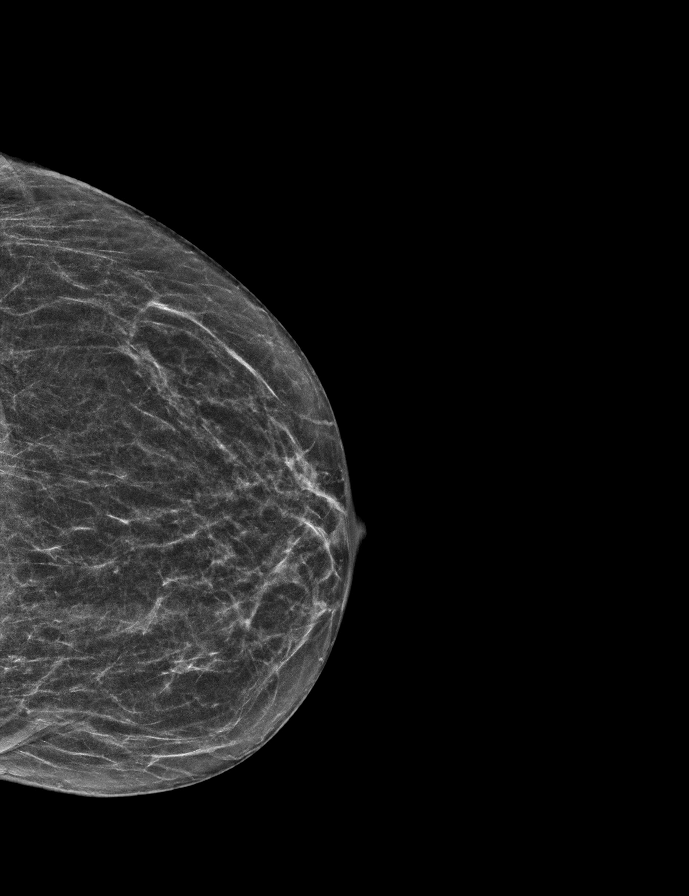

[R CC tomo · 2 of 43 frames shown]
[frame 14/43]
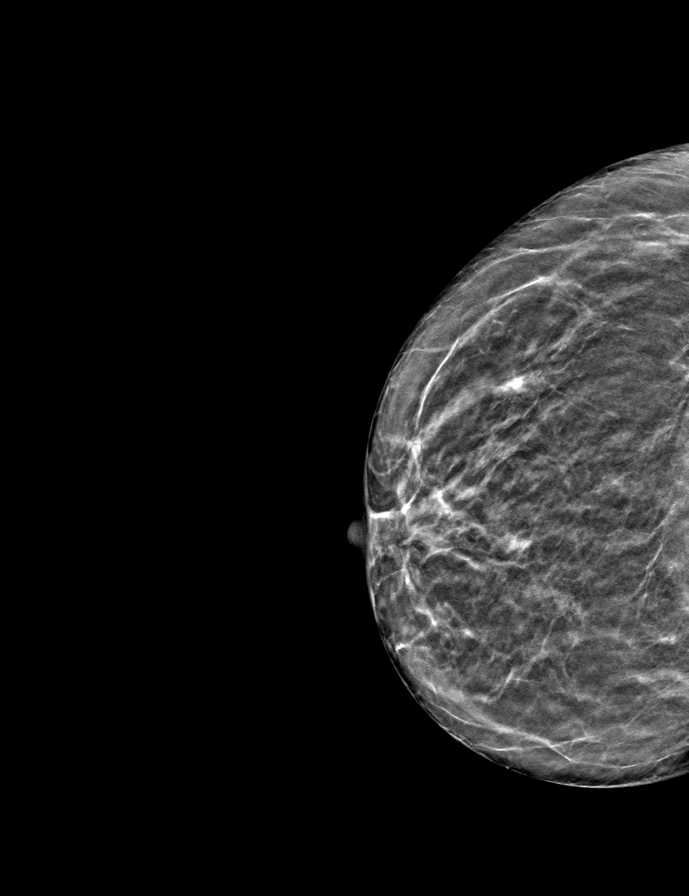
[frame 22/43]
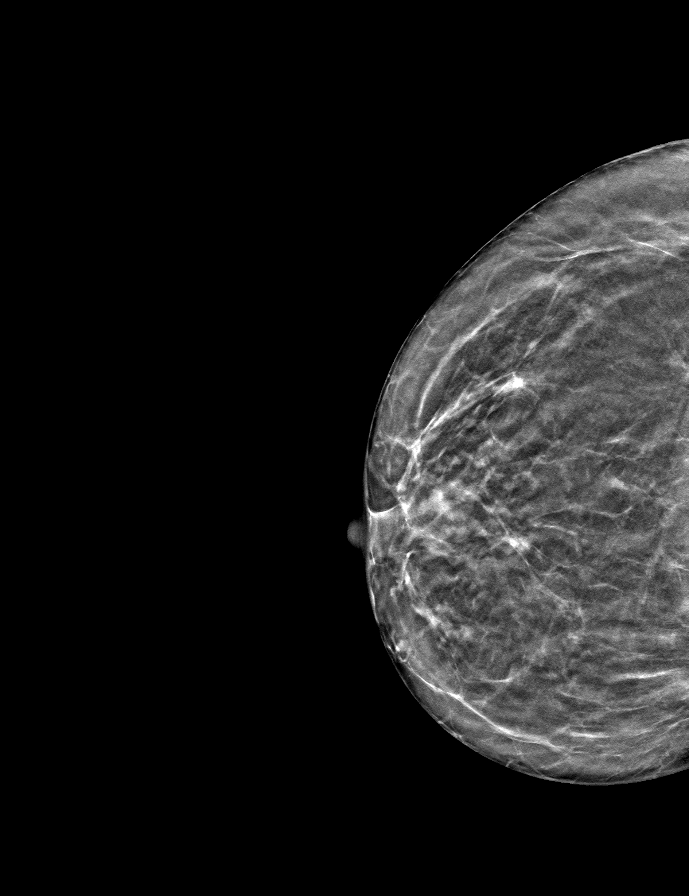

[R MLO tomo · tomo slice 22/43.0]
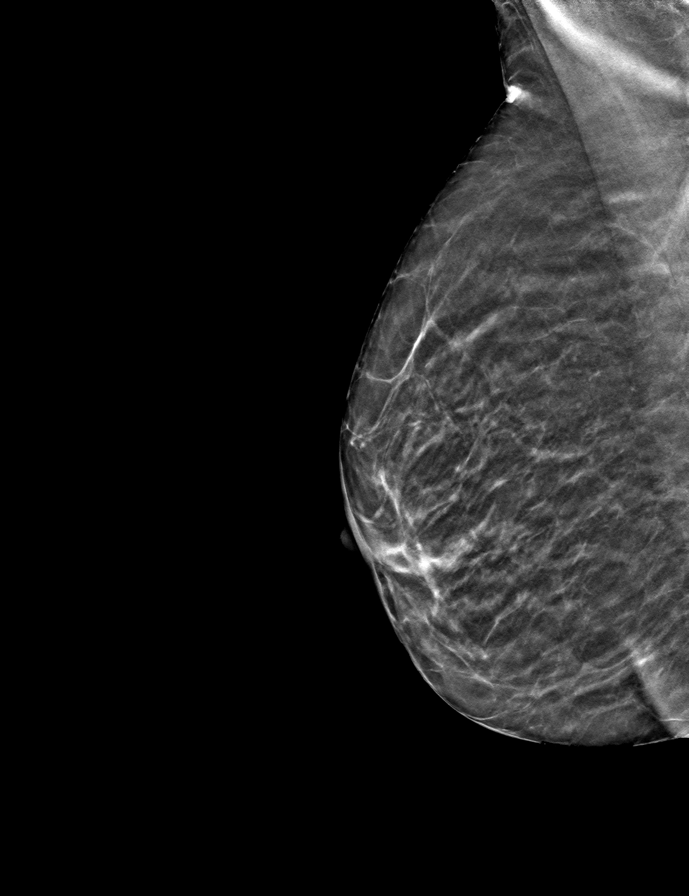

[L MLO tomo · tomo slice 22/43.0]
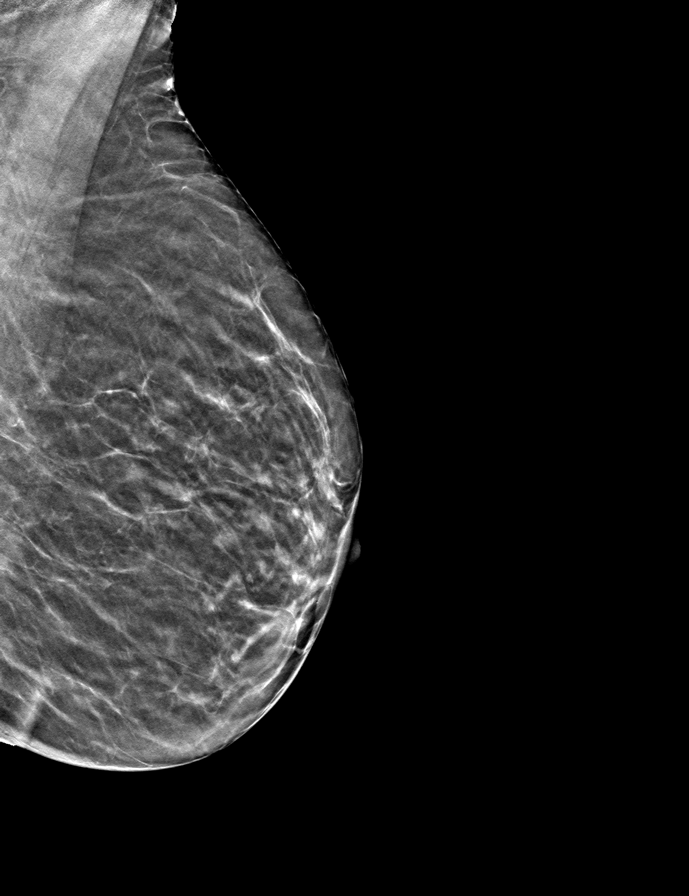

[L CC tomo · tomo slice 23/44.0]
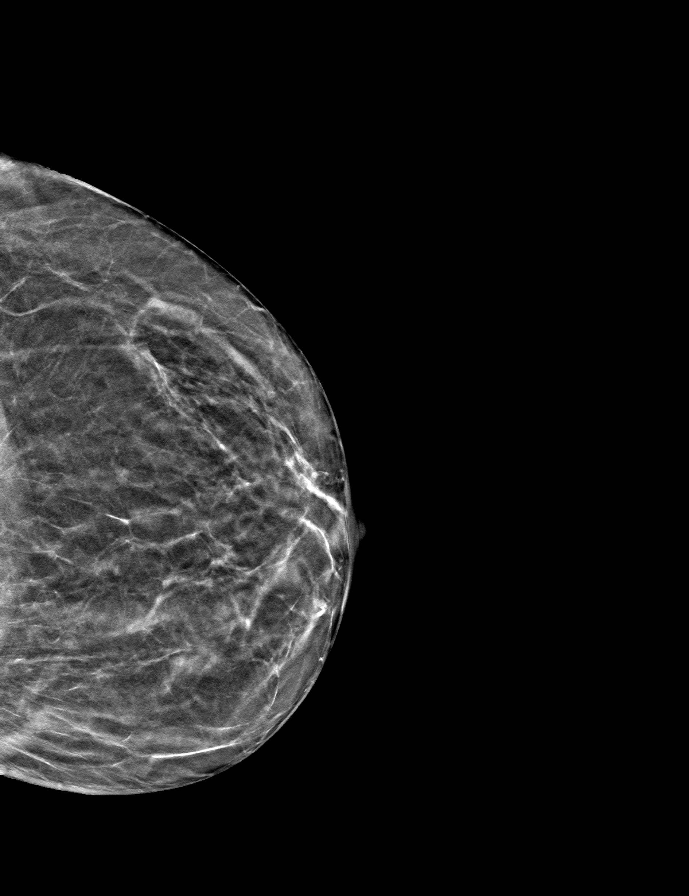

[9 of 24 positions shown; findings below may reference images not displayed]

ACR Breast Density Category b: There are scattered areas of
fibroglandular density.
FINDINGS: There are no findings suspicious for malignancy.
IMPRESSION: No mammographic evidence of malignancy. A result letter of this
screening mammogram will be mailed directly to the patient.

RECOMMENDATION:
Screening mammogram in one year. (Code:51-O-LD2)

BI-RADS CATEGORY  1: Negative.

## 2023-09-16 ENCOUNTER — Other Ambulatory Visit: Payer: Self-pay | Admitting: Student

## 2023-09-16 DIAGNOSIS — E039 Hypothyroidism, unspecified: Secondary | ICD-10-CM

## 2023-10-15 DIAGNOSIS — M3501 Sicca syndrome with keratoconjunctivitis: Secondary | ICD-10-CM | POA: Diagnosis not present

## 2023-10-15 DIAGNOSIS — H353132 Nonexudative age-related macular degeneration, bilateral, intermediate dry stage: Secondary | ICD-10-CM | POA: Diagnosis not present

## 2023-10-15 DIAGNOSIS — H26491 Other secondary cataract, right eye: Secondary | ICD-10-CM | POA: Diagnosis not present

## 2023-11-02 ENCOUNTER — Ambulatory Visit: Payer: Medicare Other | Admitting: Student

## 2023-11-04 ENCOUNTER — Ambulatory Visit: Payer: Medicare Other | Admitting: Student

## 2023-11-04 ENCOUNTER — Encounter: Payer: Self-pay | Admitting: Student

## 2023-11-04 VITALS — BP 128/72 | HR 67 | Temp 98.0°F | Ht 63.0 in | Wt 93.0 lb

## 2023-11-04 DIAGNOSIS — E039 Hypothyroidism, unspecified: Secondary | ICD-10-CM | POA: Diagnosis not present

## 2023-11-04 DIAGNOSIS — M810 Age-related osteoporosis without current pathological fracture: Secondary | ICD-10-CM | POA: Diagnosis not present

## 2023-11-04 DIAGNOSIS — Z1231 Encounter for screening mammogram for malignant neoplasm of breast: Secondary | ICD-10-CM | POA: Diagnosis not present

## 2023-11-04 DIAGNOSIS — N393 Stress incontinence (female) (male): Secondary | ICD-10-CM

## 2023-11-04 NOTE — Progress Notes (Unsigned)
 Location:  [facility] POS: [facility] Provider: Sydnee Cabal  Code Status: DNR Goals of Care:     10/29/2022    1:24 PM  Advanced Directives  Type of Advance Directive Living will;Healthcare Power of Attorney;Out of facility DNR (pink MOST or yellow form)  Copy of Healthcare Power of Attorney in Chart? No - copy requested     Chief Complaint  Patient presents with   Medical Management of Chronic Issues    Medical Management of Chronic Issues.     HPI: Patient is a 89 y.o. female seen today for medical management of chronic diseases.   Discussed the use of AI scribe software for clinical note transcription with the patient, who gave verbal consent to proceed.  History of Present Illness   Deanna Cobb "[name]" is a 89 year old female who presents with ongoing bladder issues.  She experiences urinary incontinence characterized by leakage with sneezing or sudden movements. The leakage can be significant, and she manages it by keeping her bladder empty and wearing a pad constantly due to its unpredictability. She has been performing Kegel exercises and other pelvic strengthening exercises without relief. She mentions a previous consultation with a urologist in [place] who recommended exercises.  Her gastrointestinal health has improved significantly, with no diarrhea flare-ups in almost a month, whereas she previously experienced weekly episodes. She attributes this improvement to following an IBS protocol and uses Imodium as needed for diarrhea. No significant issues with acid reflux since following the IBS protocol, and she is careful with her diet to manage these symptoms.  She has a history of osteoporosis and stopped taking Prolia approximately three years ago. She is eager to have a bone scan to assess the effectiveness of her exercise regimen on her bone health.  She discusses her weight, noting that she has been exercising regularly and has gained a few pounds, currently  weighing 93 pounds, up from 89 pounds. She engages in various forms of exercise, including weights, yoga, Pilates, walking, and golf.  Her thyroid function was last checked a year ago and was normal at that time. She is due for another check as part of her routine monitoring.         Past Medical History:  Diagnosis Date   Accidental fall from chair    Allergy    multiple food allergies   Asthma    as a child   Colitis 2012   Coronary atherosclerosis    D/T lipid rich plaque   GERD (gastroesophageal reflux disease)    H. pylori infection 2012   Heart murmur    Hyperlipidemia    Hypothyroidism    Insomnia    Internal hemorrhoids    Mitral valve prolapse    Osteoporosis    Sarcoidosis 1966   TIA (transient ischemic attack) 04/2005   Visit for suture removal     Past Surgical History:  Procedure Laterality Date   LYMPH NODE BIOPSY  09/08/1964   TONSILLECTOMY     VAGINAL HYSTERECTOMY  09/08/1970    Allergies  Allergen Reactions   Clindamycin Other (See Comments)   Penicillin G Hives and Rash   Clindamycin/Lincomycin    Penicillins     REACTION: hives    Outpatient Encounter Medications as of 11/04/2023  Medication Sig   carbamide peroxide (DEBROX) 6.5 % OTIC solution Place 5 drops into the left ear 2 (two) times daily. For 5 days each month   Cholecalciferol (VITAMIN D-3) 125 MCG (5000  UT) TABS Take one tablet by mouth once daily.   cyanocobalamin (VITAMIN B12) 1000 MCG tablet Take 1,000 mcg by mouth daily.   dicyclomine (BENTYL) 10 MG capsule Take 1 capsule (10 mg total) by mouth 3 (three) times daily as needed for spasms (IBS).   levothyroxine (SYNTHROID) 25 MCG tablet TAKE 1 TABLET BY MOUTH EVERY DAY   MAGNESIUM PO Take 1 capsule by mouth at bedtime.   Menaquinone-7 (VITAMIN K2 PO) Take 1 capsule by mouth in the morning.   Multiple Vitamins-Minerals (PRESERVISION AREDS 2) CAPS Take 1 tablet by mouth 2 (two) times daily.   Omega-3 Fatty Acids (EPA) 1000 MG CAPS  Take 1 capsule by mouth daily.   Probiotic Product (PROBIOTIC ADVANCED PO) Take 1 capsule by mouth daily as needed.   No facility-administered encounter medications on file as of 11/04/2023.    Review of Systems:  Review of Systems  All other systems reviewed and are negative.   Health Maintenance  Topic Date Due   COVID-19 Vaccine (9 - 2024-25 season) 07/17/2023   Medicare Annual Wellness (AWV)  03/12/2024   DTaP/Tdap/Td (3 - Td or Tdap) 05/28/2031   Pneumonia Vaccine 69+ Years old  Completed   INFLUENZA VACCINE  Completed   DEXA SCAN  Completed   Zoster Vaccines- Shingrix  Completed   HPV VACCINES  Aged Out    Physical Exam: Vitals:   11/04/23 1317  BP: 128/72  Pulse: 67  Temp: 98 F (36.7 C)  SpO2: 98%  Weight: 93 lb (42.2 kg)  Height: 5\' 3"  (1.6 m)   Body mass index is 16.47 kg/m. Physical Exam Vitals reviewed.  Constitutional:      Appearance: Normal appearance.  Cardiovascular:     Rate and Rhythm: Normal rate and regular rhythm.     Pulses: Normal pulses.     Heart sounds: Normal heart sounds.  Pulmonary:     Effort: Pulmonary effort is normal.  Abdominal:     General: Abdomen is flat. Bowel sounds are normal.     Palpations: Abdomen is soft.  Musculoskeletal:        General: No swelling or tenderness.  Skin:    General: Skin is warm and dry.  Neurological:     Mental Status: She is alert and oriented to person, place, and time.     Gait: Gait normal.  Psychiatric:        Mood and Affect: Mood normal.       Assessment/Plan There are no diagnoses linked to this encounter. Assessment and Plan    Urinary Incontinence Reports stress incontinence with leakage during sneezing or sudden jolts. Performing Kegel exercises and other pelvic strengthening exercises without improvement. Interested in seeing a urologist for further evaluation and potential treatment options, including a urethral sling. Discussed potential use of topical estrogen, noting mixed  views on its effectiveness for stress incontinence. - Refer to urologist Dr. Vanna Scotland - Discuss potential use of topical estrogen with urologist  Osteoporosis Discontinued Prolia three years ago. Interested in assessing the effectiveness of current bone-strengthening regimen. Discussed that latest research shows bone-strengthening exercises work better than medication. - Order DEXA scan  Irritable Bowel Syndrome (IBS) Reports significant improvement in IBS symptoms with current protocol, experiencing almost a month without a flare-up. Manages diarrhea with Imodium as needed. - Continue current IBS management protocol - Use Imodium as needed for diarrhea  General Health Maintenance Blood pressure and weight are well-controlled. Due for annual mammogram and thyroid function test. Decided to  have this year's mammogram as her last, given slow growth of breast cancer beyond age 76 and higher likelihood of other health issues being life-threatening. - Order mammogram - Order annual thyroid function test - Schedule labs on Monday or Thursday at 7:30 AM - Document decision to discontinue breast exams after this year  Follow-up - Follow up via MyChart for mammogram and DEXA scan results - Monitor for notes from urologist Dr. Vanna Scotland.       Labs/tests ordered:  CMP, TSH, Vitamin D Next appt:  Visit date not found

## 2023-11-04 NOTE — Patient Instructions (Signed)
 VISIT SUMMARY:  Today, we discussed several of your ongoing health concerns and made plans to address them. Your urinary incontinence, osteoporosis, and irritable bowel syndrome (IBS) were the main focus areas. We also reviewed your general health maintenance, including routine labs and screenings.  YOUR PLAN:  -URINARY INCONTINENCE: Urinary incontinence is the involuntary leakage of urine, often triggered by activities like sneezing or sudden movements. We will refer you to urologist Dr. Vanna Scotland for further evaluation and potential treatment options, including discussing the use of topical estrogen.  -OSTEOPOROSIS: Osteoporosis is a condition where bones become weak and brittle. You stopped taking Prolia three years ago and are interested in assessing the effectiveness of your current bone-strengthening exercises. We will order a DEXA scan to evaluate your bone health.  -IRRITABLE BOWEL SYNDROME (IBS): IBS is a gastrointestinal disorder causing symptoms like diarrhea and abdominal pain. You have experienced significant improvement with your current management protocol. Continue following this protocol and use Imodium as needed for diarrhea.  -GENERAL HEALTH MAINTENANCE: Your blood pressure and weight are well-controlled. You are due for your annual mammogram and thyroid function test. We will order these tests and schedule your labs. You have decided to make this year's mammogram your last, given the slow growth of breast cancer beyond age 28.  INSTRUCTIONS:  Please follow up via MyChart for your mammogram and DEXA scan results. Monitor for notes from urologist Dr. Vanna Scotland. Schedule your labs on Monday or Thursday at 7:30 AM.

## 2023-11-09 DIAGNOSIS — E039 Hypothyroidism, unspecified: Secondary | ICD-10-CM | POA: Diagnosis not present

## 2023-11-09 DIAGNOSIS — N393 Stress incontinence (female) (male): Secondary | ICD-10-CM | POA: Diagnosis not present

## 2023-11-09 DIAGNOSIS — M810 Age-related osteoporosis without current pathological fracture: Secondary | ICD-10-CM | POA: Diagnosis not present

## 2023-11-10 LAB — COMPLETE METABOLIC PANEL WITH GFR
AG Ratio: 2 (calc) (ref 1.0–2.5)
ALT: 14 U/L (ref 6–29)
AST: 20 U/L (ref 10–35)
Albumin: 4.3 g/dL (ref 3.6–5.1)
Alkaline phosphatase (APISO): 62 U/L (ref 37–153)
BUN: 17 mg/dL (ref 7–25)
CO2: 32 mmol/L (ref 20–32)
Calcium: 9.6 mg/dL (ref 8.6–10.4)
Chloride: 102 mmol/L (ref 98–110)
Creat: 0.81 mg/dL (ref 0.60–0.95)
Globulin: 2.1 g/dL (ref 1.9–3.7)
Glucose, Bld: 80 mg/dL (ref 65–99)
Potassium: 4 mmol/L (ref 3.5–5.3)
Sodium: 139 mmol/L (ref 135–146)
Total Bilirubin: 0.9 mg/dL (ref 0.2–1.2)
Total Protein: 6.4 g/dL (ref 6.1–8.1)
eGFR: 70 mL/min/{1.73_m2} (ref >=60)

## 2023-11-10 LAB — VITAMIN D 25 HYDROXY (VIT D DEFICIENCY, FRACTURES): Vit D, 25-Hydroxy: 84 ng/mL (ref 30–100)

## 2023-11-10 LAB — TSH: TSH: 3.09 m[IU]/L (ref 0.40–4.50)

## 2023-11-12 ENCOUNTER — Encounter: Payer: Self-pay | Admitting: Student

## 2023-12-17 DIAGNOSIS — D2272 Melanocytic nevi of left lower limb, including hip: Secondary | ICD-10-CM | POA: Diagnosis not present

## 2023-12-17 DIAGNOSIS — L720 Epidermal cyst: Secondary | ICD-10-CM | POA: Diagnosis not present

## 2023-12-17 DIAGNOSIS — L538 Other specified erythematous conditions: Secondary | ICD-10-CM | POA: Diagnosis not present

## 2023-12-17 DIAGNOSIS — D2261 Melanocytic nevi of right upper limb, including shoulder: Secondary | ICD-10-CM | POA: Diagnosis not present

## 2023-12-17 DIAGNOSIS — Z85828 Personal history of other malignant neoplasm of skin: Secondary | ICD-10-CM | POA: Diagnosis not present

## 2023-12-17 DIAGNOSIS — L820 Inflamed seborrheic keratosis: Secondary | ICD-10-CM | POA: Diagnosis not present

## 2023-12-17 DIAGNOSIS — L570 Actinic keratosis: Secondary | ICD-10-CM | POA: Diagnosis not present

## 2023-12-17 DIAGNOSIS — D2262 Melanocytic nevi of left upper limb, including shoulder: Secondary | ICD-10-CM | POA: Diagnosis not present

## 2023-12-17 DIAGNOSIS — D225 Melanocytic nevi of trunk: Secondary | ICD-10-CM | POA: Diagnosis not present

## 2024-01-18 ENCOUNTER — Ambulatory Visit: Admitting: Urology

## 2024-02-29 ENCOUNTER — Ambulatory Visit
Admission: RE | Admit: 2024-02-29 | Discharge: 2024-02-29 | Disposition: A | Discharge status: Home / Self Care | Source: Ambulatory Visit | Attending: Student | Admitting: Student

## 2024-02-29 DIAGNOSIS — M810 Age-related osteoporosis without current pathological fracture: Secondary | ICD-10-CM | POA: Insufficient documentation

## 2024-02-29 DIAGNOSIS — Z1231 Encounter for screening mammogram for malignant neoplasm of breast: Secondary | ICD-10-CM | POA: Diagnosis not present

## 2024-02-29 DIAGNOSIS — Z780 Asymptomatic menopausal state: Secondary | ICD-10-CM | POA: Diagnosis not present

## 2024-03-03 ENCOUNTER — Ambulatory Visit: Payer: Self-pay | Admitting: Student

## 2024-03-03 ENCOUNTER — Encounter: Payer: Self-pay | Admitting: Student

## 2024-03-07 ENCOUNTER — Telehealth: Payer: Self-pay | Admitting: *Deleted

## 2024-03-07 NOTE — Telephone Encounter (Signed)
 Copied from CRM [id number]. Topic: General - Other >> Mar 07, 2024 10:42 AM Miquel SAILOR wrote: Reason for CRM: DG BONE DENSITY (DXA) (Accession [id number]) (Order [id number])- 06/23 Patient needs call back on imagine for results does not understand.  Why no cholesterol lab done?   [phone]

## 2024-03-21 NOTE — Telephone Encounter (Signed)
 Mychart message sent to patient.

## 2024-03-21 NOTE — Telephone Encounter (Signed)
 Please contact patient to discuss in a visit.

## 2024-04-14 DIAGNOSIS — M3501 Sicca syndrome with keratoconjunctivitis: Secondary | ICD-10-CM | POA: Diagnosis not present

## 2024-04-14 DIAGNOSIS — H02132 Senile ectropion of right lower eyelid: Secondary | ICD-10-CM | POA: Diagnosis not present

## 2024-04-14 DIAGNOSIS — H353132 Nonexudative age-related macular degeneration, bilateral, intermediate dry stage: Secondary | ICD-10-CM | POA: Diagnosis not present

## 2024-05-02 ENCOUNTER — Ambulatory Visit: Admitting: Urology

## 2024-05-25 DIAGNOSIS — Z23 Encounter for immunization: Secondary | ICD-10-CM | POA: Diagnosis not present

## 2024-09-19 ENCOUNTER — Encounter: Payer: Self-pay | Admitting: *Deleted

## 2024-09-28 ENCOUNTER — Other Ambulatory Visit: Payer: Self-pay

## 2024-09-28 DIAGNOSIS — E039 Hypothyroidism, unspecified: Secondary | ICD-10-CM

## 2024-09-28 MED ORDER — LEVOTHYROXINE SODIUM 25 MCG PO TABS: 25.0000 ug | ORAL_TABLET | Freq: Every day | ORAL | 3 refills | Status: AC

## 2024-11-04 ENCOUNTER — Ambulatory Visit: Payer: Medicare Other | Admitting: Student

## 2024-11-04 ENCOUNTER — Encounter: Admitting: Orthopedic Surgery
